# Patient Record
Sex: Female | Born: 1985 | Race: White | Hispanic: No | Marital: Married | Smoking: Never smoker
Health system: Southern US, Community
[De-identification: ages and names within clinical notes are randomized; demographics above are authoritative.]

## PROBLEM LIST (undated history)

## (undated) DIAGNOSIS — N809 Endometriosis, unspecified: Secondary | ICD-10-CM

## (undated) DIAGNOSIS — T7840XA Allergy, unspecified, initial encounter: Secondary | ICD-10-CM

## (undated) DIAGNOSIS — J45909 Unspecified asthma, uncomplicated: Secondary | ICD-10-CM

## (undated) DIAGNOSIS — E538 Deficiency of other specified B group vitamins: Secondary | ICD-10-CM

## (undated) DIAGNOSIS — M7989 Other specified soft tissue disorders: Secondary | ICD-10-CM

## (undated) DIAGNOSIS — R519 Headache, unspecified: Secondary | ICD-10-CM

## (undated) DIAGNOSIS — G4730 Sleep apnea, unspecified: Secondary | ICD-10-CM

## (undated) DIAGNOSIS — E282 Polycystic ovarian syndrome: Secondary | ICD-10-CM

## (undated) DIAGNOSIS — M1990 Unspecified osteoarthritis, unspecified site: Secondary | ICD-10-CM

## (undated) DIAGNOSIS — F32A Depression, unspecified: Secondary | ICD-10-CM

## (undated) DIAGNOSIS — K5900 Constipation, unspecified: Secondary | ICD-10-CM

## (undated) DIAGNOSIS — N979 Female infertility, unspecified: Secondary | ICD-10-CM

## (undated) DIAGNOSIS — F419 Anxiety disorder, unspecified: Secondary | ICD-10-CM

## (undated) DIAGNOSIS — K219 Gastro-esophageal reflux disease without esophagitis: Secondary | ICD-10-CM

## (undated) HISTORY — DX: Allergy, unspecified, initial encounter: T78.40XA

## (undated) HISTORY — DX: Female infertility, unspecified: N97.9

## (undated) HISTORY — DX: Polycystic ovarian syndrome: E28.2

## (undated) HISTORY — DX: Sleep apnea, unspecified: G47.30

## (undated) HISTORY — PX: LAPAROSCOPIC OVARIAN CYSTECTOMY: SHX6248

## (undated) HISTORY — PX: EXPLORATORY LAPAROTOMY: SUR591

## (undated) HISTORY — DX: Unspecified osteoarthritis, unspecified site: M19.90

## (undated) HISTORY — DX: Other specified soft tissue disorders: M79.89

## (undated) HISTORY — DX: Deficiency of other specified B group vitamins: E53.8

## (undated) HISTORY — DX: Constipation, unspecified: K59.00

---

## 2014-11-10 DIAGNOSIS — H9209 Otalgia, unspecified ear: Secondary | ICD-10-CM | POA: Insufficient documentation

## 2014-11-10 DIAGNOSIS — M26609 Unspecified temporomandibular joint disorder, unspecified side: Secondary | ICD-10-CM | POA: Insufficient documentation

## 2014-11-10 DIAGNOSIS — M542 Cervicalgia: Secondary | ICD-10-CM | POA: Insufficient documentation

## 2015-06-24 DIAGNOSIS — J209 Acute bronchitis, unspecified: Secondary | ICD-10-CM | POA: Insufficient documentation

## 2015-06-24 DIAGNOSIS — J302 Other seasonal allergic rhinitis: Secondary | ICD-10-CM | POA: Insufficient documentation

## 2015-06-24 DIAGNOSIS — T781XXA Other adverse food reactions, not elsewhere classified, initial encounter: Secondary | ICD-10-CM | POA: Insufficient documentation

## 2015-06-24 DIAGNOSIS — L509 Urticaria, unspecified: Secondary | ICD-10-CM | POA: Insufficient documentation

## 2015-06-24 DIAGNOSIS — J4540 Moderate persistent asthma, uncomplicated: Secondary | ICD-10-CM | POA: Insufficient documentation

## 2015-10-25 DIAGNOSIS — H6993 Unspecified Eustachian tube disorder, bilateral: Secondary | ICD-10-CM | POA: Insufficient documentation

## 2016-06-11 DIAGNOSIS — Z91013 Allergy to seafood: Secondary | ICD-10-CM | POA: Insufficient documentation

## 2018-08-19 DIAGNOSIS — R42 Dizziness and giddiness: Secondary | ICD-10-CM | POA: Insufficient documentation

## 2018-08-19 DIAGNOSIS — R202 Paresthesia of skin: Secondary | ICD-10-CM | POA: Insufficient documentation

## 2018-11-03 DIAGNOSIS — R000 Tachycardia, unspecified: Secondary | ICD-10-CM | POA: Insufficient documentation

## 2020-04-12 DIAGNOSIS — I10 Essential (primary) hypertension: Secondary | ICD-10-CM | POA: Insufficient documentation

## 2020-09-11 DIAGNOSIS — R918 Other nonspecific abnormal finding of lung field: Secondary | ICD-10-CM | POA: Diagnosis not present

## 2020-09-11 DIAGNOSIS — J1282 Pneumonia due to coronavirus disease 2019: Secondary | ICD-10-CM | POA: Diagnosis not present

## 2020-09-11 DIAGNOSIS — R112 Nausea with vomiting, unspecified: Secondary | ICD-10-CM | POA: Diagnosis not present

## 2020-09-11 DIAGNOSIS — R0602 Shortness of breath: Secondary | ICD-10-CM | POA: Diagnosis not present

## 2020-09-11 DIAGNOSIS — J45909 Unspecified asthma, uncomplicated: Secondary | ICD-10-CM | POA: Diagnosis not present

## 2020-09-11 DIAGNOSIS — U071 COVID-19: Secondary | ICD-10-CM | POA: Diagnosis not present

## 2021-02-09 DIAGNOSIS — D352 Benign neoplasm of pituitary gland: Secondary | ICD-10-CM | POA: Diagnosis not present

## 2021-03-15 DIAGNOSIS — Z7951 Long term (current) use of inhaled steroids: Secondary | ICD-10-CM | POA: Diagnosis not present

## 2021-03-15 DIAGNOSIS — Z882 Allergy status to sulfonamides status: Secondary | ICD-10-CM | POA: Diagnosis not present

## 2021-03-15 DIAGNOSIS — M79642 Pain in left hand: Secondary | ICD-10-CM | POA: Diagnosis not present

## 2021-03-15 DIAGNOSIS — Z87891 Personal history of nicotine dependence: Secondary | ICD-10-CM | POA: Diagnosis not present

## 2021-03-15 DIAGNOSIS — M25542 Pain in joints of left hand: Secondary | ICD-10-CM | POA: Diagnosis not present

## 2021-03-15 DIAGNOSIS — S61432A Puncture wound without foreign body of left hand, initial encounter: Secondary | ICD-10-CM | POA: Diagnosis not present

## 2021-03-15 DIAGNOSIS — Y9389 Activity, other specified: Secondary | ICD-10-CM | POA: Diagnosis not present

## 2021-03-15 DIAGNOSIS — W458XXA Other foreign body or object entering through skin, initial encounter: Secondary | ICD-10-CM | POA: Diagnosis not present

## 2021-03-15 DIAGNOSIS — Z23 Encounter for immunization: Secondary | ICD-10-CM | POA: Diagnosis not present

## 2021-03-15 DIAGNOSIS — Z91013 Allergy to seafood: Secondary | ICD-10-CM | POA: Diagnosis not present

## 2021-03-15 DIAGNOSIS — Y990 Civilian activity done for income or pay: Secondary | ICD-10-CM | POA: Diagnosis not present

## 2021-03-15 DIAGNOSIS — S6992XA Unspecified injury of left wrist, hand and finger(s), initial encounter: Secondary | ICD-10-CM | POA: Diagnosis not present

## 2021-03-15 DIAGNOSIS — Z79899 Other long term (current) drug therapy: Secondary | ICD-10-CM | POA: Diagnosis not present

## 2021-03-15 DIAGNOSIS — J45909 Unspecified asthma, uncomplicated: Secondary | ICD-10-CM | POA: Diagnosis not present

## 2021-03-15 DIAGNOSIS — Z91040 Latex allergy status: Secondary | ICD-10-CM | POA: Diagnosis not present

## 2021-03-16 DIAGNOSIS — Z13228 Encounter for screening for other metabolic disorders: Secondary | ICD-10-CM | POA: Diagnosis not present

## 2021-03-16 DIAGNOSIS — Z130 Encounter for screening for diseases of the blood and blood-forming organs and certain disorders involving the immune mechanism: Secondary | ICD-10-CM | POA: Diagnosis not present

## 2021-03-16 DIAGNOSIS — Z1329 Encounter for screening for other suspected endocrine disorder: Secondary | ICD-10-CM | POA: Diagnosis not present

## 2021-03-16 DIAGNOSIS — M25542 Pain in joints of left hand: Secondary | ICD-10-CM | POA: Diagnosis not present

## 2021-03-16 DIAGNOSIS — Z136 Encounter for screening for cardiovascular disorders: Secondary | ICD-10-CM | POA: Diagnosis not present

## 2021-03-16 DIAGNOSIS — S6992XA Unspecified injury of left wrist, hand and finger(s), initial encounter: Secondary | ICD-10-CM | POA: Diagnosis not present

## 2021-03-16 DIAGNOSIS — N809 Endometriosis, unspecified: Secondary | ICD-10-CM | POA: Diagnosis not present

## 2021-03-16 DIAGNOSIS — F3281 Premenstrual dysphoric disorder: Secondary | ICD-10-CM | POA: Diagnosis not present

## 2021-03-16 DIAGNOSIS — Z0000 Encounter for general adult medical examination without abnormal findings: Secondary | ICD-10-CM | POA: Diagnosis not present

## 2021-03-17 DIAGNOSIS — F3281 Premenstrual dysphoric disorder: Secondary | ICD-10-CM | POA: Insufficient documentation

## 2021-03-17 DIAGNOSIS — N809 Endometriosis, unspecified: Secondary | ICD-10-CM | POA: Insufficient documentation

## 2021-04-13 DIAGNOSIS — R42 Dizziness and giddiness: Secondary | ICD-10-CM | POA: Diagnosis not present

## 2021-04-13 DIAGNOSIS — G43109 Migraine with aura, not intractable, without status migrainosus: Secondary | ICD-10-CM | POA: Insufficient documentation

## 2021-04-13 DIAGNOSIS — R202 Paresthesia of skin: Secondary | ICD-10-CM | POA: Diagnosis not present

## 2021-04-13 DIAGNOSIS — F3281 Premenstrual dysphoric disorder: Secondary | ICD-10-CM | POA: Diagnosis not present

## 2021-04-13 DIAGNOSIS — I10 Essential (primary) hypertension: Secondary | ICD-10-CM | POA: Diagnosis not present

## 2021-05-25 DIAGNOSIS — F419 Anxiety disorder, unspecified: Secondary | ICD-10-CM | POA: Diagnosis not present

## 2021-05-25 DIAGNOSIS — G4700 Insomnia, unspecified: Secondary | ICD-10-CM | POA: Diagnosis not present

## 2021-05-25 DIAGNOSIS — F3281 Premenstrual dysphoric disorder: Secondary | ICD-10-CM | POA: Diagnosis not present

## 2021-07-20 DIAGNOSIS — J069 Acute upper respiratory infection, unspecified: Secondary | ICD-10-CM | POA: Diagnosis not present

## 2021-07-20 DIAGNOSIS — R5383 Other fatigue: Secondary | ICD-10-CM | POA: Diagnosis not present

## 2021-07-20 DIAGNOSIS — J029 Acute pharyngitis, unspecified: Secondary | ICD-10-CM | POA: Diagnosis not present

## 2021-09-17 ENCOUNTER — Encounter (HOSPITAL_BASED_OUTPATIENT_CLINIC_OR_DEPARTMENT_OTHER): Payer: Self-pay

## 2021-09-17 ENCOUNTER — Emergency Department (HOSPITAL_BASED_OUTPATIENT_CLINIC_OR_DEPARTMENT_OTHER): Payer: BC Managed Care – PPO

## 2021-09-17 ENCOUNTER — Other Ambulatory Visit: Payer: Self-pay

## 2021-09-17 ENCOUNTER — Emergency Department (HOSPITAL_BASED_OUTPATIENT_CLINIC_OR_DEPARTMENT_OTHER)
Admission: EM | Admit: 2021-09-17 | Discharge: 2021-09-18 | Disposition: A | Discharge status: Home / Self Care | Payer: BC Managed Care – PPO | Attending: Emergency Medicine | Admitting: Emergency Medicine

## 2021-09-17 DIAGNOSIS — R188 Other ascites: Secondary | ICD-10-CM | POA: Diagnosis not present

## 2021-09-17 DIAGNOSIS — R102 Pelvic and perineal pain: Secondary | ICD-10-CM

## 2021-09-17 DIAGNOSIS — R1032 Left lower quadrant pain: Secondary | ICD-10-CM | POA: Diagnosis not present

## 2021-09-17 DIAGNOSIS — N809 Endometriosis, unspecified: Secondary | ICD-10-CM | POA: Diagnosis not present

## 2021-09-17 DIAGNOSIS — R109 Unspecified abdominal pain: Secondary | ICD-10-CM | POA: Diagnosis not present

## 2021-09-17 DIAGNOSIS — N200 Calculus of kidney: Secondary | ICD-10-CM | POA: Diagnosis not present

## 2021-09-17 HISTORY — DX: Endometriosis, unspecified: N80.9

## 2021-09-17 LAB — CBC WITH DIFFERENTIAL/PLATELET
Abs Immature Granulocytes: 0.01 10*3/uL (ref 0.00–0.07)
Basophils Absolute: 0.1 10*3/uL (ref 0.0–0.1)
Basophils Relative: 1 %
Eosinophils Absolute: 0.1 10*3/uL (ref 0.0–0.5)
Eosinophils Relative: 2 %
HCT: 36.3 % (ref 36.0–46.0)
Hemoglobin: 11.7 g/dL — ABNORMAL LOW (ref 12.0–15.0)
Immature Granulocytes: 0 %
Lymphocytes Relative: 34 %
Lymphs Abs: 2.3 10*3/uL (ref 0.7–4.0)
MCH: 30.2 pg (ref 26.0–34.0)
MCHC: 32.2 g/dL (ref 30.0–36.0)
MCV: 93.6 fL (ref 80.0–100.0)
Monocytes Absolute: 0.5 10*3/uL (ref 0.1–1.0)
Monocytes Relative: 7 %
Neutro Abs: 3.8 10*3/uL (ref 1.7–7.7)
Neutrophils Relative %: 56 %
Platelets: 287 10*3/uL (ref 150–400)
RBC: 3.88 MIL/uL (ref 3.87–5.11)
RDW: 13.4 % (ref 11.5–15.5)
WBC: 6.8 10*3/uL (ref 4.0–10.5)
nRBC: 0 % (ref 0.0–0.2)

## 2021-09-17 LAB — COMPREHENSIVE METABOLIC PANEL
ALT: 11 U/L (ref 0–44)
AST: 12 U/L — ABNORMAL LOW (ref 15–41)
Albumin: 4.4 g/dL (ref 3.5–5.0)
Alkaline Phosphatase: 58 U/L (ref 38–126)
Anion gap: 10 (ref 5–15)
BUN: 10 mg/dL (ref 6–20)
CO2: 24 mmol/L (ref 22–32)
Calcium: 9.2 mg/dL (ref 8.9–10.3)
Chloride: 104 mmol/L (ref 98–111)
Creatinine, Ser: 0.86 mg/dL (ref 0.44–1.00)
GFR, Estimated: >60 mL/min (ref >60)
Glucose, Bld: 90 mg/dL (ref 70–99)
Potassium: 3.6 mmol/L (ref 3.5–5.1)
Sodium: 138 mmol/L (ref 135–145)
Total Bilirubin: 0.3 mg/dL (ref 0.3–1.2)
Total Protein: 7.1 g/dL (ref 6.5–8.1)

## 2021-09-17 LAB — LIPASE, BLOOD: Lipase: 18 U/L (ref 11–51)

## 2021-09-17 LAB — URINALYSIS, ROUTINE W REFLEX MICROSCOPIC
Bilirubin Urine: NEGATIVE
Glucose, UA: NEGATIVE mg/dL
Hgb urine dipstick: NEGATIVE
Ketones, ur: 15 mg/dL — AB
Leukocytes,Ua: NEGATIVE
Nitrite: NEGATIVE
Specific Gravity, Urine: 1.034 — ABNORMAL HIGH (ref 1.005–1.030)
pH: 6 (ref 5.0–8.0)

## 2021-09-17 LAB — PREGNANCY, URINE: Preg Test, Ur: NEGATIVE

## 2021-09-17 MED ORDER — MORPHINE SULFATE (PF) 4 MG/ML IV SOLN
4.0000 mg | Freq: Once | INTRAVENOUS | Status: AC
  Administered 2021-09-17: 4 mg via INTRAVENOUS
  Filled 2021-09-17: qty 1

## 2021-09-17 MED ORDER — IOHEXOL 300 MG/ML  SOLN
100.0000 mL | Freq: Once | INTRAMUSCULAR | Status: AC | PRN
  Administered 2021-09-17: 100 mL via INTRAVENOUS

## 2021-09-17 MED ORDER — LACTATED RINGERS IV BOLUS
1000.0000 mL | Freq: Once | INTRAVENOUS | Status: AC
  Administered 2021-09-17: 1000 mL via INTRAVENOUS

## 2021-09-17 MED ORDER — OXYCODONE-ACETAMINOPHEN 5-325 MG PO TABS: 1.0000 | ORAL_TABLET | Freq: Four times a day (QID) | ORAL | 0 refills | Status: DC | PRN

## 2021-09-17 NOTE — Discharge Instructions (Signed)
Call your primary care doctor or specialist as discussed in the next 2-3 days.   Return immediately back to the ER if:  Your symptoms worsen within the next 12-24 hours. You develop new symptoms such as new fevers, persistent vomiting, new pain, shortness of breath, or new weakness or numbness, or if you have any other concerns.  

## 2021-09-17 NOTE — ED Provider Notes (Signed)
Work-up in the ER showed trace to moderate pelvic free fluid thought to be physiologic on CT imaging.  No adnexal masses noted.  On my evaluation patient appears comfortable.  She states her pain is improved and nearly gone.  She has an appointment with her endometriosis specialist coming up and would like some pain medicines while she is waiting for the appointment.  Advised immediate return back to the ER if she has worsening symptoms or fevers otherwise to call her physician's office in the next 1 to 2 days.  Patient states understanding, discharged home in stable condition.   Luna Fuse, MD 09/17/21 2337

## 2021-09-17 NOTE — ED Triage Notes (Signed)
Patient here POV from Home with Pelvic Pain.  Patient states she has Endometriosis and this Occurrence has been occurring for approximately 1 month. Patient has Appointment with Specialist in 11 days but could not withstand symptoms and would like Medication for inflammation.  NAD Noted during Triage. A&Ox4. GCS 15.

## 2021-09-17 NOTE — ED Provider Notes (Addendum)
[facility] Provider Note   CSN: [id number] Arrival date & time: 09/17/21  1816     History Chief Complaint  Patient presents with   Pelvic Pain    Tara Singleton is a 36 y.o. female presents the emergency department for evaluation of pelvic pain and lower abdominal pain.  Patient reports that it feels like she has "hot" in her lower abdomen and has occasional "hot poker stabbing" in her right lower quadrant and left lower quadrant.  Patient reports this pain been having for the last month but has been gradually worsening this past week.  She reports she has had some abdominal distention as well as some constipation.  She reports she additionally has some nausea, without vomiting although this is chronic for her.  Denies any diarrhea, fevers, vaginal discharge, vaginal bleeding, or hematuria.  She does report some dysuria.  Medical history includes endometriosis, pituitary adenoma, PCOS, and asthma.  Surgical history includes laparoscopy, and ruptured ovarian cyst as well as a tonsillectomy.  Daily medications include Zyrtec, vitamin D, fluoxetine, verapamil, trazodone, topiramate, rizatriptan.  Allergic to Bactrim and Macrobid.  Denies any tobacco, EtOH, illicit drug use ever.   Pelvic Pain Associated symptoms include abdominal pain. Pertinent negatives include no chest pain and no shortness of breath.      Home Medications Prior to Admission medications   Not on File      Allergies    Macrobid [nitrofurantoin] and Sulfa antibiotics    Review of Systems   Review of Systems  Constitutional:  Negative for chills and fever.  HENT:  Negative for ear pain and sore throat.   Eyes:  Negative for pain and visual disturbance.  Respiratory:  Negative for cough and shortness of breath.   Cardiovascular:  Negative for chest pain and palpitations.  Gastrointestinal:  Positive for abdominal distention, abdominal pain, constipation and nausea. Negative for  diarrhea and vomiting.  Genitourinary:  Positive for dysuria and pelvic pain. Negative for hematuria.  Musculoskeletal:  Negative for arthralgias and back pain.  Skin:  Negative for color change and rash.  Neurological:  Negative for seizures and syncope.  All other systems reviewed and are negative.  Physical Exam Updated Vital Signs BP 91/62 (BP Location: Right Arm)    Pulse 65    Temp 98 F (36.7 C)    Resp 16    Ht 5\' 6"  (1.676 m)    Wt 124.7 kg    SpO2 100%    BMI 44.39 kg/m  Physical Exam Vitals and nursing note reviewed.  Constitutional:      General: She is not in acute distress.    Appearance: Normal appearance. She is not toxic-appearing.  HENT:     Head: Normocephalic and atraumatic.     Mouth/Throat:     Mouth: Mucous membranes are moist.  Eyes:     General: No scleral icterus. Cardiovascular:     Rate and Rhythm: Normal rate and regular rhythm.  Pulmonary:     Effort: Pulmonary effort is normal. No respiratory distress.     Breath sounds: Normal breath sounds.  Abdominal:     General: Bowel sounds are normal.     Palpations: Abdomen is soft.     Tenderness: There is abdominal tenderness. There is no right CVA tenderness, left CVA tenderness, guarding or rebound.     Comments: Abdominal exam limited secondary to body habitus.  Tenderness to the left lower quadrant greater than right lower quadrant.  Negative Rovsing's.  No  guarding or rebound.  Abdomen soft.  Normoactive bowel sounds.  Musculoskeletal:        General: No deformity.     Cervical back: Normal range of motion.  Skin:    General: Skin is warm and dry.  Neurological:     General: No focal deficit present.     Mental Status: She is alert. Mental status is at baseline.    ED Results / Procedures / Treatments   Labs (all labs ordered are listed, but only abnormal results are displayed) Labs Reviewed  URINALYSIS, ROUTINE W REFLEX MICROSCOPIC - Abnormal; Notable for the following components:       Result Value   Specific Gravity, Urine 1.034 (*)    Ketones, ur 15 (*)    Protein, ur TRACE (*)    All other components within normal limits  CBC WITH DIFFERENTIAL/PLATELET - Abnormal; Notable for the following components:   Hemoglobin 11.7 (*)    All other components within normal limits  COMPREHENSIVE METABOLIC PANEL - Abnormal; Notable for the following components:   AST 12 (*)    All other components within normal limits  PREGNANCY, URINE  LIPASE, BLOOD    EKG None  Radiology US PELVIC COMPLETE W TRANSVAGINAL AND TORSION R/O  Result Date: 09/17/2021 CLINICAL DATA:  Pelvic pain EXAM: TRANSABDOMINAL AND TRANSVAGINAL ULTRASOUND OF PELVIS DOPPLER ULTRASOUND OF OVARIES TECHNIQUE: Both transabdominal and transvaginal ultrasound examinations of the pelvis were performed. Transabdominal technique was performed for global imaging of the pelvis including uterus, ovaries, adnexal regions, and pelvic cul-de-sac. It was necessary to proceed with endovaginal exam following the transabdominal exam to visualize the uterus, endometrium, ovaries and adnexa. Color and duplex Doppler ultrasound was utilized to evaluate blood flow to the ovaries. COMPARISON:  None. FINDINGS: Uterus Measurements: 6.4 x 3.1 x 3.8 cm = volume: 39 mL. No fibroids or other mass visualized. Endometrium Thickness: 6 mm in thickness.  No focal abnormality visualized. Right ovary Measurements: 2.6 x 2.8 x 2.6 cm = volume: 9.8 mL. Normal appearance/no adnexal mass. Left ovary Measurements: 2.1 x 3.6 x 2.4 cm = volume: 9.3 mL. Normal appearance/no adnexal mass. Pulsed Doppler evaluation of both ovaries demonstrates normal low-resistance arterial and venous waveforms. Other findings Moderate free fluid in the pelvis. IMPRESSION: Moderate free fluid in the pelvis.  Otherwise negative. Electronically Signed   By: Rolm Baptise M.D.   On: 09/17/2021 21:36    Procedures Procedures   Medications Ordered in ED Medications  lactated ringers  bolus 1,000 mL (1,000 mLs Intravenous New Bag/Given 09/17/21 2154)    ED Course/ Medical Decision Making/ A&P                           Medical Decision Making  36 year old female presents emergency department for gradually worsening abdominal pain left greater than right for the past month, gradually worse the past week.  Differential diagnosis includes but is not limited to ovarian torsion, endometriosis, ovarian cyst, ruptured cyst, ectopic pregnancy, diverticulitis, constipation, SBO.  Vital signs show normotension with normal pulse rate.  Afebrile and satting 100% on room air.  Physical exam shows left lower quadrant pain greater than right lower quadrant pain.  No guarding or rebound.  Negative Rovsing's.  Soft abdomen, nonsurgical.  Otherwise normal.  Labs and ultrasound ordered.  Ultrasound shows moderate amount of free fluid in the pelvis.  Will order CT for correlation per my attending.  Labs show slightly decreased hemoglobin 11.7, however no  elevated white blood cell count.  Urine shows concentrated urine with trace ketones and protein.  Likely due to dehydration as patient reports she does not drink much water.  CMP shows slightly decreased AST, however no Electra abnormalities.  Normal creatinine.  Lipase negative.  1 L of lactated Ringer's ordered as well is 4 mg of morphine.  Call handoff to Dr. Almyra Free at this time pending CT scan.  10:35 PM Care of Leatta Alewine  transferred to Dr. Almyra Free at the end of my shift as the patient will require reassessment once labs/imaging have resulted. Patient presentation, ED course, and plan of care discussed with review of all pertinent labs and imaging. Please see his/her note for further details regarding further ED course and disposition. Plan at time of handoff is pending CT scan, likely discharge home. This may be altered or completely changed at the discretion of the oncoming team pending results of further workup.  Final Clinical  Impression(s) / ED Diagnoses Final diagnoses:  Pelvic pain  Endometriosis    Rx / DC Orders ED Discharge Orders     None         Sherrell Puller, PA-C 09/17/21 2239    Sherrell Puller, PA-C 09/17/21 2303    Luna Fuse, MD 09/17/21 2336

## 2021-09-18 NOTE — ED Notes (Signed)
Pt teaching provided on medications that may cause drowsiness. Pt instructed not to drive or operate heavy machinery while taking the prescribed medication. Pt verbalized understanding.  ? ?Pt provided discharge instructions and prescription information. Pt was given the opportunity to ask questions and questions were answered. Discharge signature not obtained in the setting of the COVID-19 pandemic in order to reduce high touch surfaces.  ? ?

## 2021-09-28 DIAGNOSIS — E282 Polycystic ovarian syndrome: Secondary | ICD-10-CM | POA: Diagnosis not present

## 2021-09-28 DIAGNOSIS — M6289 Other specified disorders of muscle: Secondary | ICD-10-CM | POA: Diagnosis not present

## 2021-09-28 DIAGNOSIS — N803 Endometriosis of pelvic peritoneum, unspecified: Secondary | ICD-10-CM | POA: Diagnosis not present

## 2021-09-28 DIAGNOSIS — Z124 Encounter for screening for malignant neoplasm of cervix: Secondary | ICD-10-CM | POA: Diagnosis not present

## 2021-10-05 ENCOUNTER — Ambulatory Visit: Payer: BC Managed Care – PPO | Attending: Obstetrics and Gynecology | Admitting: Physical Therapy

## 2021-10-05 ENCOUNTER — Other Ambulatory Visit: Payer: Self-pay

## 2021-10-05 DIAGNOSIS — M6281 Muscle weakness (generalized): Secondary | ICD-10-CM | POA: Insufficient documentation

## 2021-10-05 DIAGNOSIS — M62838 Other muscle spasm: Secondary | ICD-10-CM | POA: Diagnosis not present

## 2021-10-07 NOTE — Therapy (Signed)
[facility] @ [facility], [place], [address] Phone: [phone]   Fax:  [phone]  Physical Therapy Evaluation  Patient Details  Name: Tara Singleton MRN: [id number] Date of Birth: 01-01-1986 Referring Provider (PT): Lysbeth Penner   Encounter Date: 10/05/2021   PT End of Session - 10/07/21 0943     Visit Number 1    Date for PT Re-Evaluation 12/28/21    Authorization Type BCBS    PT Start Time 0933    PT Stop Time 1013    PT Time Calculation (min) 40 min    Activity Tolerance Patient tolerated treatment well    Behavior During Therapy [facility] for tasks assessed/performed             Past Medical History:  Diagnosis Date   Endometriosis     No past surgical history on file.  There were no vitals filed for this visit.    Subjective Assessment - 10/07/21 1004     Subjective This last year I helped my mom move b/c step dad passed and then she moved and got married.  Pt has a lot of medical history as seen below.  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    Pertinent History PCOS, endometriosis, laproscopic surgery when dx endo in 2018, pituitary adenoma, asthma    Patient Stated Goals decreased pain    Currently in Pain? Yes    Pain Score 2    this is a good day and had muscle relaxer   Pain Location Pelvis    Pain Orientation Left    Pain Descriptors / Indicators Burning;Shooting;Sharp    Pain Type Chronic pain    Pain Radiating Towards left lower abdomen    Pain Onset More than a month ago    Pain Frequency Constant    Aggravating Factors  hard work week, stress    Pain Relieving Factors heated blanket, muscle relaxer    Multiple Pain Sites No                [facility] PT Assessment - 10/07/21 0001       Assessment   Medical Diagnosis endometriosis    Referring Provider (PT) Clide Dales, Amy Nicole    Onset Date/Surgical Date --   been getting worse this last year   Prior  Therapy No      Precautions   Precautions None      Balance Screen   Has the patient fallen in the past 6 months No      [place] residence    Living Arrangements Spouse/significant other   2 cats and dog     Prior Function   Level of Independence Independent    Vocation Full time employment    Vocation Requirements sitting, standing, 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      Cognition   Overall Cognitive Status Within Functional Limits for tasks assessed      Posture/Postural Control   Posture/Postural Control Postural limitations    Postural Limitations Increased lumbar lordosis;Rounded Shoulders      ROM / Strength   AROM / PROM / Strength AROM;PROM;Strength      AROM   Overall AROM Comments lumbar flexion 75%      PROM   Overall PROM Comments bil IR 50%; Lt hip flexion 75% +pain      Strength   Overall Strength Comments 5/5 bil  hip      Flexibility   Soft Tissue Assessment /Muscle Length yes    Hamstrings 70% +pain      Palpation   Palpation comment glutes, lumbar and thoracic paraspinals tight, TTP lower left quadrant      Ambulation/Gait   Gait Pattern Within Functional Limits                        Objective measurements completed on examination: See above findings.     Pelvic Floor Special Questions - 10/07/21 0001     Are you Pregnant or attempting pregnancy? No    Prior Pregnancies Yes    Number of Pregnancies 5   all miscarriages   Currently Sexually Active No   hasn't been for 2 years   Urinary Leakage No    Urinary urgency No    Urinary frequency sometimes more frequently and feel like not emptying my bladder all the way    Fecal incontinence --   constipation   Fluid intake I drink a lot of water    Pelvic Floor Internal Exam pt identity confirmed and informed consent    Exam Type Vaginal    Palpation TTP throught Lt>Rt and towards Lt obdurator very TTP and more  strength Rt levators    Strength fair squeeze, definite lift    Strength # of reps 1    Tone high                       PT Education - 10/07/21 1005     Education Details gave instructions on diaphragmatic breathing and do pelvic floor meditation    Person(s) Educated Patient    Methods Explanation;Handout    Comprehension Verbalized understanding              PT Short Term Goals - 10/07/21 1003       PT SHORT TERM GOAL #1   Title Pt will report pain decreased by at least 25%    Time 6    Period Weeks    Status New    Target Date 11/16/21               PT Long Term Goals - 10/07/21 0950       PT LONG TERM GOAL #1   Title Pt will report at least 50% reduced pain    Time 12    Period Weeks    Status New    Target Date 12/28/21      PT LONG TERM GOAL #2   Title Pt will be ind with HEP for pain management    Time 12    Period Weeks    Status New    Target Date 12/28/21      PT LONG TERM GOAL #3   Title Pt will be able to manage working without pain flare ups even during a more challengeing work week due to improved pain management strategies    Time 12    Period Weeks    Status New    Target Date 12/28/21      PT LONG TERM GOAL #4   Title Pt will have normal hip ROM flexion and rotation without pain for improved functional positions and movements    Time 12    Period Weeks    Status New    Target Date 12/28/21  Plan - 10/07/21 1534     Clinical Impression Statement Pt presents to clinic due to pelvic pain and endometriosis causing worsening pain over the past two years.  Pt has a lot of tension in the pelvic floor and tender to palpation Lt>Rt levators and obdurator internus.  Pt has tight hamstrings and limted hip ROM with pain as noted above.  Pt has 3/5 MMT of the pelvic floor and taking extra time to relax after contracting.  Pt will benefit from skilled PT to improve pain management and functional  activities with reduced pain.    Personal Factors and Comorbidities Comorbidity 2;Time since onset of injury/illness/exacerbation    Comorbidities PCOS, endo    Examination-Participation Restrictions Community Activity;Occupation;Interpersonal Relationship    Stability/Clinical Decision Making Evolving/Moderate complexity    Clinical Decision Making Moderate    Rehab Potential Excellent    PT Frequency 1x / week    PT Duration 12 weeks    PT Treatment/Interventions ADLs/Self Care Home Management;Biofeedback;Cryotherapy;Electrical Stimulation;Moist Heat;Neuromuscular re-education;Therapeutic exercise;Therapeutic activities;Taping;Patient/family education;Manual techniques;Dry needling;Passive range of motion    PT Next Visit Plan STM low back and thoracic paraspinals, internal fascial release in 1-2 visits and f/u on breathing and bulging    PT Home Exercise Plan meditation; add stretches next    Consulted and Agree with Plan of Care Patient             Patient will benefit from skilled therapeutic intervention in order to improve the following deficits and impairments:  Pain, Decreased coordination, Decreased range of motion, Postural dysfunction, Decreased strength, Increased muscle spasms  Visit Diagnosis: Other muscle spasm  Muscle weakness (generalized)     Problem List There are no problems to display for this patient.   Jule Ser, PT 10/07/2021, 5:01 PM  [facility] @ [facility], [place], [address] Phone: [phone]   Fax:  [phone]  Name: Tara Singleton MRN: [id number] Date of Birth: 1986/08/04

## 2021-10-09 ENCOUNTER — Encounter: Payer: BC Managed Care – PPO | Admitting: Physical Therapy

## 2021-10-26 ENCOUNTER — Ambulatory Visit: Payer: BC Managed Care – PPO | Admitting: Physical Therapy

## 2021-11-09 ENCOUNTER — Ambulatory Visit: Payer: BC Managed Care – PPO | Attending: Obstetrics and Gynecology | Admitting: Physical Therapy

## 2021-11-09 ENCOUNTER — Encounter: Payer: Self-pay | Admitting: Physical Therapy

## 2021-11-09 ENCOUNTER — Other Ambulatory Visit: Payer: Self-pay

## 2021-11-09 DIAGNOSIS — M6281 Muscle weakness (generalized): Secondary | ICD-10-CM | POA: Insufficient documentation

## 2021-11-09 DIAGNOSIS — M62838 Other muscle spasm: Secondary | ICD-10-CM | POA: Diagnosis not present

## 2021-11-09 NOTE — Therapy (Signed)
[facility] ?[facility] @ [place] ?[place], [place], [address] ?Phone: [phone]   Fax:  [phone] ? ?Physical Therapy Treatment ? ?Patient Details  ?Name: Tara Singleton ?MRN: [id number] ?Date of Birth: October 29, 1985 ?Referring Provider (PT): Lysbeth Penner ? ? ?Encounter Date: 11/09/2021 ? ? PT End of Session - 11/09/21 1012   ? ? Visit Number 2   ? Date for PT Re-Evaluation 12/28/21   ? Authorization Type BCBS   ? PT Start Time 1006   ? PT Stop Time 1048   ? PT Time Calculation (min) 42 min   ? Activity Tolerance Patient tolerated treatment well   ? Behavior During Therapy [facility] for tasks assessed/performed   ? ?  ?  ? ?  ? ? ?Past Medical History:  ?Diagnosis Date  ? Endometriosis   ? ? ?History reviewed. No pertinent surgical history. ? ?There were no vitals filed for this visit. ? ? Subjective Assessment - 11/09/21 1010   ? ? Subjective The stretches are going well and videos of pelvic floor stuff online.  I was able to get the cup in doing the stretches.   ? Pertinent History PCOS, endometriosis, laproscopic surgery when dx endo in 2018, pituitary adenoma, asthma   ? Patient Stated Goals decreased pain   ? Currently in Pain? No/denies   ? ?  ?  ? ?  ? ? ? ? ? ? ? ? ? ? ? ? ? ? ? ? ? ? ? ? [facility] PT Treatment/Exercise - 11/09/21 0001   ? ?  ? Neuro Re-ed   ? Neuro Re-ed Details  breathing with yoga block for stretching and activation of core   ?  ? Exercises  ? Exercises Other Exercises   ? Other Exercises  stretches: hip rotation and lean on table toes together   ?  ? Manual Therapy  ? Manual Therapy Soft tissue mobilization;Internal Pelvic Floor   ? Manual therapy comments pt identity confirmed and intenral soft tissue assess and treated   ? Soft tissue mobilization lumbar parapsinlas prone with pillow   ? Internal Pelvic Floor bilat bulbocavernosis and levators with breahting; Lt a little tighter   ? ?  ?  ? ?  ? ? ? ? ? ? ? ? ? ? PT Education - 11/09/21  1100   ? ? Education Details Access Code: [email or web address]   ? Person(s) Educated Patient   ? Methods Explanation;Demonstration;Tactile cues;Verbal cues;Handout   ? Comprehension Verbalized understanding;Returned demonstration   ? ?  ?  ? ?  ? ? ? PT Short Term Goals - 11/09/21 1107   ? ?  ? PT SHORT TERM GOAL #1  ? Title Pt will report pain decreased by at least 25%   ? Baseline definitely less , did not specify how much   ? Status Partially Met   ? ?  ?  ? ?  ? ? ? ? PT Long Term Goals - 11/09/21 1020   ? ?  ? PT LONG TERM GOAL #1  ? Title Pt will report at least 50% reduced pain   ? Status On-going   ?  ? PT LONG TERM GOAL #2  ? Title Pt will be ind with HEP for pain management   ? Status On-going   ?  ? PT LONG TERM GOAL #3  ? Title Pt will be able to manage working without pain flare ups even during a more challengeing work week  due to improved pain management strategies   ? Status On-going   ?  ? PT LONG TERM GOAL #4  ? Title Pt will have normal hip ROM flexion and rotation without pain for improved functional positions and movements   ? Status On-going   ? ?  ?  ? ?  ? ? ? ? ? ? ? ? Plan - 11/09/21 1101   ? ? Clinical Impression Statement Pt did well with today's treatmentand feeling stretches in appropriate places.  Her pain is down significantly with stretches she has been doing.  Pt tolerated stretches and STM internally today.  Pt needed cues to keep back flat due to tight hamstrings.  Pt also demonstrates increased ribcage flaring at rest and needed cues on correctly engaging the core.  Pt will benefit from skilled PT to continue to address core strength and flexibility so less stress being place on the pelvic floor, ultimately to manage pain more efficiently.   ? PT Treatment/Interventions ADLs/Self Care Home Management;Biofeedback;Cryotherapy;Electrical Stimulation;Moist Heat;Neuromuscular re-education;Therapeutic exercise;Therapeutic activities;Taping;Patient/family education;Manual techniques;Dry  needling;Passive range of motion   ? PT Next Visit Plan down dog on table to plank, core activation, internal STM and give info on pelvic wand if still having a lot of tension there, thoracic mobility stretches   ? PT Home Exercise Plan Access Code: [email or web address]   ? Consulted and Agree with Plan of Care Patient   ? ?  ?  ? ?  ? ? ?Patient will benefit from skilled therapeutic intervention in order to improve the following deficits and impairments:  Pain, Decreased coordination, Decreased range of motion, Postural dysfunction, Decreased strength, Increased muscle spasms ? ?Visit Diagnosis: ?Other muscle spasm ? ?Muscle weakness (generalized) ? ? ? ? ?Problem List ?There are no problems to display for this patient. ? ? ?Jule Ser, PT ?11/09/2021, 11:08 AM ? ?[facility] ?[facility] @ [place] ?[place], [place], [address] ?Phone: [phone]   Fax:  [phone] ? ?Name: Caelynn Marshman ?MRN: [id number] ?Date of Birth: 10/26/1985 ? ? ? ?

## 2021-11-09 NOTE — Patient Instructions (Signed)
Access Code: [email or web address] ?URL: [email or web address] ?Date: 11/09/2021 ?Prepared by: Jari Favre ? ?Exercises ?Supine Hip Internal and External Rotation - 1 x daily - 7 x weekly - 1 sets - 10 reps - 5 sec hold ?Supine Diaphragmatic Breathing - 3 x daily - 7 x weekly - 1 sets - 10 reps ?Standing Low Back Flexion at Table - 3 x daily - 7 x weekly - 1 sets - 8 reps ?Standing Hamstring Stretch with Step - 1 x daily - 7 x weekly - 1 sets - 3 reps - 30 sec hold ? ?

## 2021-11-16 ENCOUNTER — Ambulatory Visit: Payer: BC Managed Care – PPO | Admitting: Physical Therapy

## 2021-11-23 ENCOUNTER — Other Ambulatory Visit: Payer: Self-pay

## 2021-11-23 ENCOUNTER — Ambulatory Visit: Payer: BC Managed Care – PPO | Admitting: Physical Therapy

## 2021-11-23 ENCOUNTER — Encounter: Payer: Self-pay | Admitting: Physical Therapy

## 2021-11-23 DIAGNOSIS — M6281 Muscle weakness (generalized): Secondary | ICD-10-CM | POA: Diagnosis not present

## 2021-11-23 DIAGNOSIS — M62838 Other muscle spasm: Secondary | ICD-10-CM

## 2021-11-25 NOTE — Therapy (Signed)
[facility] ?[facility] ?[place], [place], [address] ?Phone: [phone]   Fax:  [phone] ? ?Physical Therapy Treatment ? ?Patient Details  ?Name: Tara Singleton ?MRN: [id number] ?Date of Birth: [birth year] ?Referring Provider (PT): Lysbeth Penner ? ? ?Encounter Date: 11/23/2021 ? ? PT End of Session - 11/25/21 1733   ? ? Visit Number 3   ? Date for PT Re-Evaluation 12/28/21   ? Authorization Type BCBS   ? PT Start Time 1018   ? PT Stop Time 1054   ? PT Time Calculation (min) 36 min   ? Activity Tolerance Patient tolerated treatment well   ? Behavior During Therapy [facility] for tasks assessed/performed   ? ?  ?  ? ?  ? ? ?Past Medical History:  ?Diagnosis Date  ? Endometriosis   ? ? ?History reviewed. No pertinent surgical history. ? ?There were no vitals filed for this visit. ? ? Subjective Assessment - 11/25/21 1734   ? ? Subjective I feel much better even though my week was stressful.  Today my back is sore and I think it is because of ovulating because I have that feeling.  Pt has wand with her.   ? Pertinent History PCOS, endometriosis, laproscopic surgery when dx endo in 2018, pituitary adenoma, asthma   ? Patient Stated Goals decreased pain   ? Currently in Pain? No/denies   ? ?  ?  ? ?  ? ? ? ? ? ? ? ? ? ? ? ? ? ? ? ? ? ? ? ? [facility] Adult PT Treatment/Exercise - 11/25/21 0001   ? ?  ? Self-Care  ? Self-Care Other Self-Care Comments   ? Other Self-Care Comments  pelvic floor wand demo and use to make sure she is able to do at home   ?  ? Exercises  ? Other Exercises  pelvic tilt at wall   ?  ? Manual Therapy  ? Manual Therapy Soft tissue mobilization;Internal Pelvic Floor   ? Manual therapy comments pt identity confirmed and intenral soft tissue assess and treated   ? Internal Pelvic Floor bilat bulbocavernosis and levators with breathing; Lt a little tighter   ? ?  ?  ? ?  ? ? ? ? ? ? ? ? ? ? ? ? PT Short Term Goals - 11/09/21 1107   ? ?  ? PT SHORT  TERM GOAL #1  ? Title Pt will report pain decreased by at least 25%   ? Baseline definitely less , did not specify how much   ? Status Partially Met   ? ?  ?  ? ?  ? ? ? ? PT Long Term Goals - 11/23/21 1048   ? ?  ? PT LONG TERM GOAL #1  ? Title Pt will report at least 50% reduced pain   ? Baseline 90% better   ? Status Achieved   ?  ? PT LONG TERM GOAL #2  ? Title Pt will be ind with HEP for pain management   ?  ? PT LONG TERM GOAL #3  ? Title Pt will be able to manage working without pain flare ups even during a more challengeing work week due to improved pain management strategies   ? ?  ?  ? ?  ? ? ? ? ? ? ? ? Plan - 11/25/21 1738   ? ? Clinical Impression Statement Today's session focused on pt learning how to use  the pelvic wand and being comfortable with using at home for trigger point release.  She successfully was able to use and released pelvic floor muscles feeling it relieved some pain in her back.   ? PT Treatment/Interventions ADLs/Self Care Home Management;Biofeedback;Cryotherapy;Electrical Stimulation;Moist Heat;Neuromuscular re-education;Therapeutic exercise;Therapeutic activities;Taping;Patient/family education;Manual techniques;Dry needling;Passive range of motion   ? PT Next Visit Plan work on some core strengthening exercises to reinforce pelvic stability   ? PT Home Exercise Plan Access Code: [email or web address]   ? Consulted and Agree with Plan of Care Patient   ? ?  ?  ? ?  ? ? ?Patient will benefit from skilled therapeutic intervention in order to improve the following deficits and impairments:  Pain, Decreased coordination, Decreased range of motion, Postural dysfunction, Decreased strength, Increased muscle spasms ? ?Visit Diagnosis: ?Other muscle spasm ? ?Muscle weakness (generalized) ? ? ? ? ?Problem List ?There are no problems to display for this patient. ? ? ?Jule Ser, PT ?11/25/2021, 6:22 PM ? ?[facility] ?[facility] [place] [place] ?[place], [place], [address] ?Phone: [phone]   Fax:  [phone] ? ?Name: Shine Scrogham ?MRN: [id number] ?Date of Birth: 1986/08/19 ? ? ? ?

## 2021-11-30 ENCOUNTER — Encounter: Payer: BC Managed Care – PPO | Admitting: Physical Therapy

## 2021-12-07 ENCOUNTER — Encounter: Payer: BC Managed Care – PPO | Admitting: Physical Therapy

## 2021-12-19 ENCOUNTER — Ambulatory Visit: Payer: BC Managed Care – PPO | Attending: Obstetrics and Gynecology | Admitting: Physical Therapy

## 2021-12-19 ENCOUNTER — Encounter: Payer: Self-pay | Admitting: Physical Therapy

## 2021-12-19 DIAGNOSIS — M6281 Muscle weakness (generalized): Secondary | ICD-10-CM | POA: Diagnosis not present

## 2021-12-19 DIAGNOSIS — M62838 Other muscle spasm: Secondary | ICD-10-CM | POA: Diagnosis not present

## 2021-12-19 NOTE — Therapy (Signed)
[facility] ?[facility] ?[place], [place], [address] ?Phone: [phone]   Fax:  [phone] ? ?Physical Therapy Treatment ? ?Patient Details  ?Name: Tara Singleton ?MRN: [id number] ?Date of Birth: 1986/05/10 ?Referring Provider (PT): Lysbeth Penner ? ? ?Encounter Date: 12/19/2021 ? ? PT End of Session - 12/19/21 1154   ? ? Visit Number 4   ? Date for PT Re-Evaluation 12/28/21   ? Authorization Type BCBS   ? PT Start Time 1151   ? PT Stop Time 1225   ? PT Time Calculation (min) 34 min   ? Activity Tolerance Patient tolerated treatment well   ? Behavior During Therapy [facility] for tasks assessed/performed   ? ?  ?  ? ?  ? ? ?Past Medical History:  ?Diagnosis Date  ? Endometriosis   ? ? ?History reviewed. No pertinent surgical history. ? ?There were no vitals filed for this visit. ? ? Subjective Assessment - 12/19/21 1159   ? ? Subjective I am still having pain during the period. I am having  spasms in my back today.   ? Pertinent History PCOS, endometriosis, laproscopic surgery when dx endo in 2018, pituitary adenoma, asthma   ? Patient Stated Goals decreased pain   ? Currently in Pain? No/denies   ? ?  ?  ? ?  ? ? ? ? ? ? ? ? ? ? ? ? ? ? ? ? ? ? ? ? ? ? ? ? ? ? ? ? ? ? ? PT Short Term Goals - 11/09/21 1107   ? ?  ? PT SHORT TERM GOAL #1  ? Title Pt will report pain decreased by at least 25%   ? Baseline definitely less , did not specify how much   ? Status Partially Met   ? ?  ?  ? ?  ? ? ? ? PT Long Term Goals - 12/19/21 1246   ? ?  ? PT LONG TERM GOAL #1  ? Title Pt will report at least 50% reduced pain   ? Status Achieved   ?  ? PT LONG TERM GOAL #2  ? Title Pt will be ind with HEP for pain management   ? Status Achieved   ?  ? PT LONG TERM GOAL #3  ? Title Pt will be able to manage working without pain flare ups even during a more challengeing work week due to improved pain management strategies   ? Status Achieved   ?  ? PT LONG TERM GOAL #4  ? Title Pt  will have normal hip ROM flexion and rotation without pain for improved functional positions and movements   ? Baseline hip movement WFL and no increased pain   ? Status Achieved   ? ?  ?  ? ?  ? ? ? ? ? ? ? ? Plan - 12/19/21 1245   ? ? Clinical Impression Statement Pt did well with treatment and reviewed HEP.  Pt is ind with final HEP and will discharge from PT today   ? PT Treatment/Interventions ADLs/Self Care Home Management;Biofeedback;Cryotherapy;Electrical Stimulation;Moist Heat;Neuromuscular re-education;Therapeutic exercise;Therapeutic activities;Taping;Patient/family education;Manual techniques;Dry needling;Passive range of motion   ? PT Next Visit Plan discharged   ? PT Home Exercise Plan Access Code: [email or web address]   ? Consulted and Agree with Plan of Care Patient   ? ?  ?  ? ?  ? ? ?Patient will benefit from skilled therapeutic intervention in order to improve the  following deficits and impairments:  Pain, Decreased coordination, Decreased range of motion, Postural dysfunction, Decreased strength, Increased muscle spasms ? ?Visit Diagnosis: ?No diagnosis found. ? ? ? ? ?Problem List ?There are no problems to display for this patient. ? ? ?Jule Ser, PT ?12/19/2021, 12:52 PM ? ?[facility] ?[facility] [place] ?[place], [place], [address] ?Phone: [phone]   Fax:  [phone] ? ?Name: Tara Singleton ?MRN: [id number] ?Date of Birth: May 27, 1986 ? ? ?PHYSICAL THERAPY DISCHARGE SUMMARY ? ?Visits from Start of Care: 4 ? ?Current functional level related to goals / functional outcomes: ? ? See above goals met ?Remaining deficits: ?See above details ?  ?Education / Equipment: ?HEP  ? ?Patient agrees to discharge. Patient goals were met. Patient is being discharged due to meeting the stated rehab goals. ? ?Gustavus Bryant, PT ?12/19/21 1:08 PM ? ? ?

## 2022-01-12 ENCOUNTER — Encounter (HOSPITAL_COMMUNITY): Payer: Self-pay

## 2022-01-12 ENCOUNTER — Ambulatory Visit (HOSPITAL_COMMUNITY)
Admission: RE | Admit: 2022-01-12 | Discharge: 2022-01-12 | Disposition: A | Discharge status: Home / Self Care | Payer: BC Managed Care – PPO | Source: Ambulatory Visit | Attending: Physician Assistant | Admitting: Physician Assistant

## 2022-01-12 ENCOUNTER — Ambulatory Visit (INDEPENDENT_AMBULATORY_CARE_PROVIDER_SITE_OTHER): Payer: BC Managed Care – PPO

## 2022-01-12 VITALS — BP 126/72 | HR 77 | Temp 98.1°F | Resp 18

## 2022-01-12 DIAGNOSIS — R109 Unspecified abdominal pain: Secondary | ICD-10-CM | POA: Diagnosis not present

## 2022-01-12 DIAGNOSIS — N200 Calculus of kidney: Secondary | ICD-10-CM | POA: Diagnosis not present

## 2022-01-12 LAB — POCT URINALYSIS DIPSTICK, ED / UC
Bilirubin Urine: NEGATIVE
Glucose, UA: NEGATIVE mg/dL
Hgb urine dipstick: NEGATIVE
Ketones, ur: NEGATIVE mg/dL
Leukocytes,Ua: NEGATIVE
Nitrite: NEGATIVE
Protein, ur: NEGATIVE mg/dL
Specific Gravity, Urine: 1.02 (ref 1.005–1.030)
Urobilinogen, UA: 0.2 mg/dL (ref 0.0–1.0)
pH: 8.5 — ABNORMAL HIGH (ref 5.0–8.0)

## 2022-01-12 MED ORDER — KETOROLAC TROMETHAMINE 30 MG/ML IJ SOLN
INTRAMUSCULAR | Status: AC
  Filled 2022-01-12: qty 1

## 2022-01-12 MED ORDER — KETOROLAC TROMETHAMINE 30 MG/ML IJ SOLN
30.0000 mg | Freq: Once | INTRAMUSCULAR | Status: AC
  Administered 2022-01-12: 30 mg via INTRAMUSCULAR

## 2022-01-12 NOTE — ED Provider Notes (Signed)
?[facility] ? ? ? ?CSN: [id number] ?Arrival date & time: 01/12/22  1215 ? ? ?  ? ?History   ?Chief Complaint ?Chief Complaint  ?Patient presents with  ? 1:30appt  ? Back Pain  ? ? ?HPI ?Tara Singleton is a 36 y.o. female.  ? ?Patient presents today with a several day history of severe left thoracic back pain.  She reports for the past several weeks she has had malodorous urine but denies any significant UTI symptoms.  She has urinary frequency but this is chronic and unchanged from baseline.  She denies hematuria, urinary urgency, dysuria, pelvic pain, abdominal pain, vomiting.  She does have ongoing nausea but this also is chronic and unchanged from baseline.  She has tried resting on her left side which provides temporary relief of symptoms.  She denies history of nephrolithiasis, complicated UTI, self-catheterization, recent urogenital procedure.  She did have pelvic floor physical therapy but completed course of treatment several months ago.  She denies any recent antibiotics.  ? ? ?Past Medical History:  ?Diagnosis Date  ? Endometriosis   ? ? ?There are no problems to display for this patient. ? ? ?History reviewed. No pertinent surgical history. ? ?OB History   ?No obstetric history on file. ?  ? ? ? ?Home Medications   ? ?Prior to Admission medications   ?Medication Sig Start Date End Date Taking? Authorizing Provider  ?FLUoxetine (PROZAC) 20 MG capsule 80 mg. 07/24/20  Yes [provider]  ?hydrOXYzine (ATARAX) 25 MG tablet Take by mouth. 03/17/21  Yes [provider]  ?montelukast (SINGULAIR) 10 MG tablet Take 1 tablet by mouth at bedtime. 04/17/16  Yes [provider]  ?rizatriptan (MAXALT) 10 MG tablet Take by mouth. 04/12/20  Yes [provider]  ?topiramate (TOPAMAX) 25 MG tablet Take 1 qhs x 1 week, 2 qhs x 1 week, then 3 qhs to maintain 08/25/20  Yes [provider]  ?baclofen (LIORESAL) 10 MG tablet Take 10 mg by mouth. As needed    [provider]  ?cetirizine (ZYRTEC) 10 MG tablet Take by mouth.    [provider]  ?oxyCODONE-acetaminophen (PERCOCET/ROXICET) 5-325 MG tablet Take 1 tablet by mouth every 6 (six) hours as needed for severe pain. 09/17/21   Luna Fuse, MD  ?[facility] HFA [phone] [place]) MCG/ACT inhaler Inhale into the lungs. 07/26/21   [provider]  ?verapamil (CALAN-SR) 120 MG CR tablet Take 120 mg by mouth at bedtime. 12/28/21   [provider]  ? ? ?Family History ?Family History  ?Problem Relation Age of Onset  ? Asthma Father   ? ? ?Social History ?Social History  ? ?Tobacco Use  ? Smoking status: Never  ? Smokeless tobacco: Never  ?Substance Use Topics  ? Alcohol use: Never  ? Drug use: Never  ? ? ? ?Allergies   ?Macrobid [nitrofurantoin] and Sulfa antibiotics ? ? ?Review of Systems ?Review of Systems  ?Constitutional:  Positive for activity change. Negative for appetite change, fatigue and fever.  ?Respiratory:  Negative for cough and shortness of breath.   ?Cardiovascular:  Negative for chest pain.  ?Gastrointestinal:  Positive for nausea (Chronic). Negative for abdominal pain, diarrhea and vomiting.  ?Genitourinary:  Positive for flank pain, frequency and vaginal discharge (Chronic). Negative for dysuria, urgency, vaginal bleeding and vaginal pain.  ?Musculoskeletal:  Positive for back pain. Negative for arthralgias and myalgias.  ?Neurological:  Negative for dizziness, light-headedness and headaches.  ? ? ?Physical Exam ?Triage Vital  Signs ?ED Triage Vitals [01/12/22 1328]  ?Enc Vitals Group  ?   BP 126/72  ?   Pulse Rate 77  ?   Resp 18  ?   Temp 98.1 ?F (36.7 ?C)  ?   Temp Source Oral  ?   SpO2 100 %  ?   Weight   ?   Height   ?   Head Circumference   ?   Peak Flow   ?   Pain Score 3  ?   Pain Loc   ?   Pain Edu?   ?   Excl. in [place]?   ? ?No data found. ? ?Updated Vital Signs ?BP 126/72 (BP Location: Left Arm)   Pulse 77   Temp 98.1 ?F (36.7 ?C) (Oral)   Resp 18   SpO2 100%  ? ?Visual  Acuity ?Right Eye Distance:   ?Left Eye Distance:   ?Bilateral Distance:   ? ?Right Eye Near:   ?Left Eye Near:    ?Bilateral Near:    ? ?Physical Exam ?Vitals reviewed.  ?Constitutional:   ?   General: She is awake. She is not in acute distress. ?   Appearance: Normal appearance. She is well-developed. She is not ill-appearing.  ?   Comments: Very pleasant female appears stated age in no acute distress sitting comfortably on exam room table  ?HENT:  ?   Head: Normocephalic and atraumatic.  ?Cardiovascular:  ?   Rate and Rhythm: Normal rate and regular rhythm.  ?   Heart sounds: Normal heart sounds, S1 normal and S2 normal. No murmur heard. ?Pulmonary:  ?   Effort: Pulmonary effort is normal.  ?   Breath sounds: Normal breath sounds. No wheezing, rhonchi or rales.  ?   Comments: Clear to auscultation bilaterally ?Abdominal:  ?   General: Bowel sounds are normal.  ?   Palpations: Abdomen is soft.  ?   Tenderness: There is no abdominal tenderness. There is left CVA tenderness. There is no right CVA tenderness, guarding or rebound.  ?Musculoskeletal:  ?   Cervical back: No tenderness or bony tenderness.  ?   Thoracic back: Tenderness present. No bony tenderness.  ?   Lumbar back: No tenderness or bony tenderness.  ?Psychiatric:     ?   Behavior: Behavior is cooperative.  ? ? ? ?UC Treatments / Results  ?Labs ?(all labs ordered are listed, but only abnormal results are displayed) ?Labs Reviewed  ?POCT URINALYSIS DIPSTICK, ED / UC - Abnormal; Notable for the following components:  ?    Result Value  ? pH 8.5 (*)   ? All other components within normal limits  ?URINE CULTURE  ? ? ?EKG ? ? ?Radiology ?DG Abdomen 1 View ? ?Result Date: 01/12/2022 ?CLINICAL DATA:  Left flank pain.  Nephrolithiasis. EXAM: ABDOMEN - 1 VIEW COMPARISON:  None Available. FINDINGS: The bowel gas pattern is normal. 4 mm radiodensity seen overlying the lower pole the left kidney, consistent with renal calculus. IMPRESSION: 4 mm left renal calculus.  Normal bowel gas pattern. Electronically Signed   By: Marlaine Hind M.D.   On: 01/12/2022 14:14   ? ?Procedures ?Procedures (including critical care time) ? ?Medications Ordered in UC ?Medications  ?ketorolac (TORADOL) 30 MG/ML injection 30 mg (has no administration in time range)  ? ? ?Initial Impression / Assessment and Plan / UC Course  ?I have reviewed the triage vital signs and the nursing notes. ? ?Pertinent labs & imaging results that were available  during my care of the patient were reviewed by me and considered in my medical decision making (see chart for details). ? ?  ? ?Patient is well-appearing, afebrile, nontoxic, nontachycardic.  UA showed elevated pH otherwise was normal.  KUB obtained which showed 4 mm left ureteral stone which is likely explanation for patient's symptoms.  Will obtain urine culture but defer antibiotics until results are available.  Unfortunately, she is unable to take Flomax due to concern for cross reaction from known hypersensitivity reaction to sulfa antibiotics.  She was given Toradol in clinic with instruction to avoid NSAIDs for 12 hours.  Recommended she push fluids.  She was given urine strainer and encouraged to try to catch stone.  Discussed that if symptoms do not improving quickly she is to follow-up with urology was given contact information for local provider with instruction to call to schedule an appointment.  If anything worsens and she develops severe flank pain, fever, nausea, vomiting she is to go to the emergency room to which she expressed understanding.  Strict return precautions given.  Work excuse note provided. ? ?Final Clinical Impressions(s) / UC Diagnoses  ? ?Final diagnoses:  ?Left flank pain  ?Nephrolithiasis  ? ? ? ?Discharge Instructions   ? ?  ?Your x-ray showed a kidney stone which is likely causing your pain.  Unfortunately, we cannot use Flomax since you are allergic to sulfa medications.  We did give you Toradol to help with your pain today.   Please do not take any NSAIDs including aspirin, ibuprofen/Advil, naproxen/Aleve for 12 hours after this shot.  Strain your urine as we discussed.  Make sure you are pushing fluids.  If anything worsens you need to be see

## 2022-01-12 NOTE — ED Triage Notes (Signed)
One week h/o strong urine odor and onset yesterday of intermittent thoracic back pain. Notes some vaginal irritation today. Has been taking tylenol. No hematuria or urinary frequency. Pt is suspicious for a kidney stone.   ?

## 2022-01-12 NOTE — Discharge Instructions (Signed)
Your x-ray showed a kidney stone which is likely causing your pain.  Unfortunately, we cannot use Flomax since you are allergic to sulfa medications.  We did give you Toradol to help with your pain today.  Please do not take any NSAIDs including aspirin, ibuprofen/Advil, naproxen/Aleve for 12 hours after this shot.  Strain your urine as we discussed.  Make sure you are pushing fluids.  If anything worsens you need to be seen in the emergency room.  If your symptoms or not improving please follow-up with urology; call to schedule an appointment. ?

## 2022-01-13 LAB — URINE CULTURE

## 2022-01-16 DIAGNOSIS — R1084 Generalized abdominal pain: Secondary | ICD-10-CM | POA: Diagnosis not present

## 2022-01-16 DIAGNOSIS — N200 Calculus of kidney: Secondary | ICD-10-CM | POA: Diagnosis not present

## 2022-01-18 ENCOUNTER — Other Ambulatory Visit: Payer: Self-pay | Admitting: Urology

## 2022-01-24 ENCOUNTER — Encounter (HOSPITAL_BASED_OUTPATIENT_CLINIC_OR_DEPARTMENT_OTHER): Payer: Self-pay | Admitting: Urology

## 2022-01-24 NOTE — Progress Notes (Signed)
Left VM to return call to [facility]

## 2022-01-24 NOTE — Progress Notes (Signed)
Returned call. Instructions given. Arrival time 0600. Clear liquids until 0400. NPO after MN. Wife is the driver. Went over Hx and meds

## 2022-01-25 NOTE — H&P (Signed)
Office Visit Report     01/16/2022   --------------------------------------------------------------------------------   Tara Singleton  MRN: [id number]  DOB: Nov 21, 1985, 36 year old Female  SSN:    PRIMARY CARE:    REFERRING:    PROVIDER:  Rexene Alberts, M.D.  LOCATION:  [facility] [phone]     --------------------------------------------------------------------------------   CC/HPI: Tara Singleton is a 36 year old female who is seen in consultation today for urolithiasis.   1. Urolithiasis:  -CT A/P 09/17/2021 with 5 mm nonobstructing left renal stone.  -She developed several day history of left-sided flank pain and presented to an urgent care on 01/12/2022. This pain is not well controlled however she is requesting pain medication. She denies fevers, chills, dysuria, urgency.  -KUB 01/12/2022 with 4 mm stone overlying the lower pole of the left kidney  -She denies fevers, chills, dysuria. Urinalysis today is without sign of infection.   She denies history of recurrent urinary tract infections or pyelonephritis.     ALLERGIES: Wendee Copp - Headache, Anaphylaxis, Hives, Respiratory Distress, Trouble Breathing, Dizziness Kiwi - Trouble Breathing, Nausea, Vomiting, Itching, Hives Latex - Skin Rash, Trouble Breathing, Hives, Redness Macrobid - Skin Rash, Hives, Headache, Redness Shellfish - Dizziness, Vomiting, Trouble Breathing, Itching, Headache, Swelling, Nausea, Anaphylaxis, Hives, Respiratory Distress Sulfa - Skin Rash, Nausea, Headache, Hives    MEDICATIONS: BACLOFEN PO PRN  CETIRIZINE HCL PO Daily  EPINEPHRINE IJ PRN  FLUOXETINE HCL PO Daily  LORATADINE PO PRN  ONDANSETRON ODT PO PRN  PROAIR DIGIHALER IH PRN  RIZATRIPTAN PO PRN  VERAPAMIL ER PO Daily  VITAMIN D3 PO Daily     GU PSH: None   NON-GU PSH: Tonsillectomy..     GU PMH: Endometriosis Ot - 2018    NON-GU PMH: Asthma    FAMILY HISTORY: Asthma - Father Breast Cancer - Runs In  Family Congestive Heart Failure - Mother Uterine Cancer - Mother   SOCIAL HISTORY: Marital Status: Married Current Smoking Status: Patient does not smoke anymore. Has not smoked since 01/16/2012. Smoked less than 1/2 pack per day.  Does not use smokeless tobacco. Has not drank since 01/16/2020. Had 2 drinks per month. Does not use drugs. Drinks 3 caffeinated drinks per day. Has not had a blood transfusion. Patient's occupation Herbalist .    REVIEW OF SYSTEMS:    GU Review Female:   Patient denies frequent urination, hard to postpone urination, burning /pain with urination, get up at night to urinate, leakage of urine, stream starts and stops, trouble starting your stream, have to strain to urinate, and being pregnant.  Gastrointestinal (Upper):   Patient denies nausea, vomiting, and indigestion/ heartburn.  Gastrointestinal (Lower):   Patient denies diarrhea and constipation.  Constitutional:   Patient denies fever, night sweats, weight loss, and fatigue.  Skin:   Patient denies skin rash/ lesion and itching.  Eyes:   Patient denies blurred vision and double vision.  Ears/ Nose/ Throat:   Patient denies sore throat and sinus problems.  Hematologic/Lymphatic:   Patient denies swollen glands and easy bruising.  Cardiovascular:   Patient denies leg swelling and chest pains.  Respiratory:   Patient denies cough and shortness of breath.  Endocrine:   Patient denies excessive thirst.  Musculoskeletal:   Patient denies joint pain and back pain.  Neurological:   Patient denies headaches and dizziness.  Psychologic:   Patient denies depression and anxiety.   VITAL SIGNS:      01/16/2022 11:35 AM  Weight 275  lb / 124.74 kg  Height 66 in / 167.64 cm  BP 121/78 mmHg  Pulse 80 /min  Temperature 97.5 F / 36.3 C  BMI 44.4 kg/m   MULTI-SYSTEM PHYSICAL EXAMINATION:    Constitutional: Well-nourished. No physical deformities. Normally developed. Good grooming.   Respiratory: No labored breathing, no use of accessory muscles.   Cardiovascular: Normal temperature, normal extremity pulses, no swelling, no varicosities.  Gastrointestinal: No mass, no tenderness, no rigidity, non obese abdomen. No CVA tenderness     Complexity of Data:  Source Of History:  Patient, Medical Record Summary  Records Review:   Previous Doctor Records  Urine Test Review:   Urinalysis  X-Ray Review: KUB: Reviewed Films. Reviewed Report. Discussed With Patient.  C.T. Abdomen/Pelvis: Reviewed Films. Reviewed Report. Discussed With Patient.    Notes:                     CLINICAL DATA: Left flank pain. Nephrolithiasis.   EXAM:  ABDOMEN - 1 VIEW   COMPARISON: None Available.   FINDINGS:  The bowel gas pattern is normal. 4 mm radiodensity seen overlying  the lower pole the left kidney, consistent with renal calculus.   IMPRESSION:  4 mm left renal calculus.   Normal bowel gas pattern.    Electronically Signed  By: Marlaine Hind M.D.  On: 01/12/2022 14:14  CLINICAL DATA: Left lower quadrant abdominal pain. Free fluid in  the pelvis.   EXAM:  CT ABDOMEN AND PELVIS WITH CONTRAST   TECHNIQUE:  Multidetector CT imaging of the abdomen and pelvis was performed  using the standard protocol following bolus administration of  intravenous contrast.   CONTRAST: 131m OMNIPAQUE IOHEXOL 300 MG/ML SOLN   COMPARISON: Ultrasound pelvis 09/17/2021.   FINDINGS:  Lower chest: No acute abnormality.   Hepatobiliary: No focal liver abnormality is seen. No gallstones,  gallbladder wall thickening, or biliary dilatation.   Pancreas: Unremarkable. No pancreatic ductal dilatation or  surrounding inflammatory changes.   Spleen: Normal in size without focal abnormality.   Adrenals/Urinary Tract: There is a 5 mm nonobstructing left renal  calculus. Otherwise, adrenal glands, kidneys and bladder are within  limits.   Stomach/Bowel: Stomach is within normal limits. Appendix  appears  normal. No evidence of bowel wall thickening, distention, or  inflammatory changes.   Vascular/Lymphatic: No significant vascular findings are present. No  enlarged abdominal or pelvic lymph nodes.   Reproductive: Uterus and bilateral adnexa are unremarkable.   Other: There is a small amount of free fluid in the pelvis. There is  no focal abdominal wall hernia.   Musculoskeletal: No acute or significant osseous findings.   IMPRESSION:  1. Small amount of free fluid in the pelvis may be physiologic.   2. No other acute localizing process in the abdomen or pelvis.   3. Nonobstructing left renal calculus.    Electronically Signed  By: ARonney AstersM.D.  On: 09/17/2021 23:06     PROCEDURES:          Urinalysis - 81003 Dipstick Dipstick Cont'd  Specimen: Voided Bilirubin: Neg  Color: Yellow Ketones: Neg  Appearance: Clear Blood: Neg  Specific Gravity: 1.025 Protein: Trace  pH: 6.0 Urobilinogen: 0.2  Glucose: Neg Nitrites: Neg    Leukocyte Esterase: Neg         Urinalysis - 81003 Dipstick Dipstick Cont'd  Color: Yellow Bilirubin: Neg mg/dL  Appearance: Clear Ketones: Neg mg/dL  Specific Gravity: 1.025 Blood: Neg ery/uL  pH: 6.0 Protein: Trace mg/dL  Glucose: Neg mg/dL Urobilinogen: 0.2 mg/dL    Nitrites: Neg    Leukocyte Esterase: Neg leu/uL    ASSESSMENT:      ICD-10 Details  1 GU:   Renal calculus - N20.0   2   Flank Pain - R10.84    PLAN:            Medications New Meds: Percocet 5 mg-325 mg tablet 1 tablet PO Q 4 H PRN   #20  0 Refill(s)  Pharmacy Name:  [facility]  Address:  [address]   [place], [place] [address]  Phone:  [phone]  Fax:  [phone]            Orders Labs CULTURE, URINE          Document Letter(s):  Created for Patient: Clinical Summary         Notes:    #1. Left renal stone:  -CT A/P 09/17/2021 with 5 mm nonobstructing left renal stone.  -KUB 01/12/2022 with 4 mm left renal stone  -Discussed  options for treatment including ESWL and ureteroscopy with laser lithotripsy. She like to proceed with ESWL. We will arrange for next available.   She denies taking anticoagulation.  We discussed the options for management of kidney stones, including observation, ESWL, ureteroscopy with laser lithotripsy, and PCNL. The risks and benefits of each option were discussed.  For observation I described the risks which include but are not limited to silent renal damage, life-threatening infection, need for emergent surgery, failure to pass stone, and pain.   ESWL: risks and benefits of ESWL were outlined including infection, bleeding, pain, steinstrasse, kidney injury, need for ancillary treatments, and global anesthesia risks including but not limited to CVA, MI, DVT, PE, pneumonia, and death.   Ureteroscopy: risks and benefits of ureteroscopy were outlined, including infection, bleeding, pain, temporary ureteral stent and associated stent bother, ureteral injury, ureteral stricture, need for ancillary treatments, and global anesthesia risks including but not limited to CVA, MI, DVT, PE, pneumonia, and death.   PCNL: risks and benefits of PCNL were outlined including infection, bleeding, blood transfusion, pain, pneumothorax, bowel injury, persistent urine leak, positioning injury, inability to clear stone burden, renal laceration, arterial venous fistula or malformation, need for ancillary treatments, and global anesthesia risks including but not limited to CVA, MI, DVT, PE, pneumonia, and death.    We discussed dietary methods for stone prevention including the following: increased water intake to 2-3 liters per day, add lemon or lemon concentrate to water to increase citrate which is beneficial for stone prevention, limiting dietary sodium to less than 2000 mg per day, limiting animal protein to less than 2 servings (16 ounces/day), and limiting foods high in oxalate content (spinach, beans, chocolate,  etc.).    * Signed by Rexene Alberts, M.D. on 01/16/22 at 12:00 PM (EDT)*

## 2022-01-28 ENCOUNTER — Encounter (HOSPITAL_BASED_OUTPATIENT_CLINIC_OR_DEPARTMENT_OTHER)
Admission: RE | Disposition: A | Discharge status: Home / Self Care | Payer: Self-pay | Source: Home / Self Care | Attending: Urology

## 2022-01-28 ENCOUNTER — Ambulatory Visit (HOSPITAL_BASED_OUTPATIENT_CLINIC_OR_DEPARTMENT_OTHER)
Admission: RE | Admit: 2022-01-28 | Discharge: 2022-01-28 | Disposition: A | Discharge status: Home / Self Care | Payer: BC Managed Care – PPO | Attending: Urology | Admitting: Urology

## 2022-01-28 ENCOUNTER — Ambulatory Visit (HOSPITAL_COMMUNITY): Payer: BC Managed Care – PPO

## 2022-01-28 ENCOUNTER — Encounter (HOSPITAL_BASED_OUTPATIENT_CLINIC_OR_DEPARTMENT_OTHER): Payer: Self-pay | Admitting: Urology

## 2022-01-28 ENCOUNTER — Other Ambulatory Visit: Payer: Self-pay

## 2022-01-28 DIAGNOSIS — N200 Calculus of kidney: Secondary | ICD-10-CM | POA: Diagnosis not present

## 2022-01-28 DIAGNOSIS — Z87442 Personal history of urinary calculi: Secondary | ICD-10-CM | POA: Insufficient documentation

## 2022-01-28 DIAGNOSIS — Z6841 Body Mass Index (BMI) 40.0 and over, adult: Secondary | ICD-10-CM | POA: Insufficient documentation

## 2022-01-28 DIAGNOSIS — E669 Obesity, unspecified: Secondary | ICD-10-CM | POA: Insufficient documentation

## 2022-01-28 DIAGNOSIS — Z87891 Personal history of nicotine dependence: Secondary | ICD-10-CM | POA: Insufficient documentation

## 2022-01-28 DIAGNOSIS — Z01818 Encounter for other preprocedural examination: Secondary | ICD-10-CM

## 2022-01-28 DIAGNOSIS — J45909 Unspecified asthma, uncomplicated: Secondary | ICD-10-CM | POA: Insufficient documentation

## 2022-01-28 HISTORY — DX: Unspecified asthma, uncomplicated: J45.909

## 2022-01-28 HISTORY — DX: Anxiety disorder, unspecified: F41.9

## 2022-01-28 HISTORY — DX: Depression, unspecified: F32.A

## 2022-01-28 HISTORY — DX: Gastro-esophageal reflux disease without esophagitis: K21.9

## 2022-01-28 HISTORY — DX: Headache, unspecified: R51.9

## 2022-01-28 HISTORY — PX: EXTRACORPOREAL SHOCK WAVE LITHOTRIPSY: SHX1557

## 2022-01-28 LAB — POCT PREGNANCY, URINE: Preg Test, Ur: NEGATIVE

## 2022-01-28 SURGERY — LITHOTRIPSY, ESWL
Anesthesia: LOCAL | Laterality: Left

## 2022-01-28 MED ORDER — SODIUM CHLORIDE 0.9 % IV SOLN: INTRAVENOUS | Status: DC

## 2022-01-28 MED ORDER — DIAZEPAM 5 MG PO TABS
10.0000 mg | ORAL_TABLET | ORAL | Status: AC
  Administered 2022-01-28: 10 mg via ORAL

## 2022-01-28 MED ORDER — DIAZEPAM 5 MG PO TABS
ORAL_TABLET | ORAL | Status: AC
  Filled 2022-01-28: qty 2

## 2022-01-28 MED ORDER — DIPHENHYDRAMINE HCL 25 MG PO CAPS
ORAL_CAPSULE | ORAL | Status: AC
  Filled 2022-01-28: qty 1

## 2022-01-28 MED ORDER — CIPROFLOXACIN HCL 500 MG PO TABS
500.0000 mg | ORAL_TABLET | ORAL | Status: AC
  Administered 2022-01-28: 500 mg via ORAL

## 2022-01-28 MED ORDER — DIPHENHYDRAMINE HCL 25 MG PO CAPS
25.0000 mg | ORAL_CAPSULE | ORAL | Status: AC
  Administered 2022-01-28: 25 mg via ORAL

## 2022-01-28 MED ORDER — CIPROFLOXACIN HCL 500 MG PO TABS
ORAL_TABLET | ORAL | Status: AC
  Filled 2022-01-28: qty 1

## 2022-01-28 MED ORDER — OXYCODONE-ACETAMINOPHEN 5-325 MG PO TABS: 1.0000 | ORAL_TABLET | ORAL | 0 refills | Status: DC | PRN

## 2022-01-28 NOTE — Interval H&P Note (Signed)
History and Physical Interval Note:  01/28/2022 7:19 AM  Tara Singleton  has presented today for surgery, with the diagnosis of LEFT RENAL STONE, LOWER POLE STONE.  The various methods of treatment have been discussed with the patient and family. After consideration of risks, benefits and other options for treatment, the patient has consented to  Procedure(s): EXTRACORPOREAL SHOCK WAVE LITHOTRIPSY (ESWL) (Left) as a surgical intervention.  The patient's history has been reviewed, patient examined, no change in status, stable for surgery.  I have reviewed the patient's chart and labs.  Questions were answered to the patient's satisfaction.     [employer]

## 2022-01-28 NOTE — Op Note (Signed)
See [facility] operative note scanned into chart. Also because of the size, density, location and other factors that cannot be anticipated I feel this will likely be a staged procedure. This fact supersedes any indication in the scanned [facility] operative note to the contrary.  

## 2022-01-28 NOTE — Discharge Instructions (Addendum)
1. You should strain your urine and collect all fragments and bring them to your follow up appointment.  2. You should take your pain medication as needed.  Please call if your pain is severe to the point that it is not controlled with your pain medication. 3. You should call if you develop fever > 101 or persistent nausea or vomiting.   Post Anesthesia Home Care Instructions  Activity: Get plenty of rest for the remainder of the day. A responsible individual must stay with you for 24 hours following the procedure.  For the next 24 hours, DO NOT: -Drive a car -Paediatric nurse -Drink alcoholic beverages -Take any medication unless instructed by your physician -Make any legal decisions or sign important papers.  Meals: Start with liquid foods such as gelatin or soup. Progress to regular foods as tolerated. Avoid greasy, spicy, heavy foods. If nausea and/or vomiting occur, drink only clear liquids until the nausea and/or vomiting subsides. Call your physician if vomiting continues.

## 2022-01-29 ENCOUNTER — Encounter (HOSPITAL_BASED_OUTPATIENT_CLINIC_OR_DEPARTMENT_OTHER): Payer: Self-pay | Admitting: Urology

## 2022-02-27 ENCOUNTER — Emergency Department (HOSPITAL_BASED_OUTPATIENT_CLINIC_OR_DEPARTMENT_OTHER): Payer: BC Managed Care – PPO

## 2022-02-27 ENCOUNTER — Other Ambulatory Visit (HOSPITAL_BASED_OUTPATIENT_CLINIC_OR_DEPARTMENT_OTHER): Payer: Self-pay

## 2022-02-27 ENCOUNTER — Encounter (HOSPITAL_BASED_OUTPATIENT_CLINIC_OR_DEPARTMENT_OTHER): Payer: Self-pay | Admitting: Emergency Medicine

## 2022-02-27 ENCOUNTER — Emergency Department (HOSPITAL_BASED_OUTPATIENT_CLINIC_OR_DEPARTMENT_OTHER)
Admission: EM | Admit: 2022-02-27 | Discharge: 2022-02-27 | Disposition: A | Discharge status: Home / Self Care | Payer: BC Managed Care – PPO | Attending: Emergency Medicine | Admitting: Emergency Medicine

## 2022-02-27 ENCOUNTER — Other Ambulatory Visit: Payer: Self-pay

## 2022-02-27 DIAGNOSIS — R319 Hematuria, unspecified: Secondary | ICD-10-CM | POA: Diagnosis not present

## 2022-02-27 DIAGNOSIS — Z91040 Latex allergy status: Secondary | ICD-10-CM | POA: Insufficient documentation

## 2022-02-27 DIAGNOSIS — N1330 Unspecified hydronephrosis: Secondary | ICD-10-CM | POA: Diagnosis not present

## 2022-02-27 DIAGNOSIS — R109 Unspecified abdominal pain: Secondary | ICD-10-CM | POA: Diagnosis not present

## 2022-02-27 DIAGNOSIS — N10 Acute tubulo-interstitial nephritis: Secondary | ICD-10-CM | POA: Diagnosis not present

## 2022-02-27 DIAGNOSIS — N132 Hydronephrosis with renal and ureteral calculous obstruction: Secondary | ICD-10-CM | POA: Insufficient documentation

## 2022-02-27 LAB — CBC WITH DIFFERENTIAL/PLATELET
Abs Immature Granulocytes: 0.04 10*3/uL (ref 0.00–0.07)
Basophils Absolute: 0.1 10*3/uL (ref 0.0–0.1)
Basophils Relative: 1 %
Eosinophils Absolute: 0 10*3/uL (ref 0.0–0.5)
Eosinophils Relative: 0 %
HCT: 34.8 % — ABNORMAL LOW (ref 36.0–46.0)
Hemoglobin: 11.3 g/dL — ABNORMAL LOW (ref 12.0–15.0)
Immature Granulocytes: 0 %
Lymphocytes Relative: 9 %
Lymphs Abs: 0.9 10*3/uL (ref 0.7–4.0)
MCH: 30.1 pg (ref 26.0–34.0)
MCHC: 32.5 g/dL (ref 30.0–36.0)
MCV: 92.6 fL (ref 80.0–100.0)
Monocytes Absolute: 0.4 10*3/uL (ref 0.1–1.0)
Monocytes Relative: 4 %
Neutro Abs: 8.7 10*3/uL — ABNORMAL HIGH (ref 1.7–7.7)
Neutrophils Relative %: 86 %
Platelets: 280 10*3/uL (ref 150–400)
RBC: 3.76 MIL/uL — ABNORMAL LOW (ref 3.87–5.11)
RDW: 13.4 % (ref 11.5–15.5)
WBC: 10.1 10*3/uL (ref 4.0–10.5)
nRBC: 0 % (ref 0.0–0.2)

## 2022-02-27 LAB — BASIC METABOLIC PANEL
Anion gap: 8 (ref 5–15)
BUN: 11 mg/dL (ref 6–20)
CO2: 20 mmol/L — ABNORMAL LOW (ref 22–32)
Calcium: 8.7 mg/dL — ABNORMAL LOW (ref 8.9–10.3)
Chloride: 112 mmol/L — ABNORMAL HIGH (ref 98–111)
Creatinine, Ser: 1.06 mg/dL — ABNORMAL HIGH (ref 0.44–1.00)
GFR, Estimated: >60 mL/min (ref >60)
Glucose, Bld: 105 mg/dL — ABNORMAL HIGH (ref 70–99)
Potassium: 4 mmol/L (ref 3.5–5.1)
Sodium: 140 mmol/L (ref 135–145)

## 2022-02-27 LAB — URINALYSIS, ROUTINE W REFLEX MICROSCOPIC
Bilirubin Urine: NEGATIVE
Glucose, UA: NEGATIVE mg/dL
Ketones, ur: NEGATIVE mg/dL
Leukocytes,Ua: NEGATIVE
Nitrite: NEGATIVE
Protein, ur: 30 mg/dL — AB
Specific Gravity, Urine: 1.025 (ref 1.005–1.030)
pH: 8.5 — ABNORMAL HIGH (ref 5.0–8.0)

## 2022-02-27 LAB — PREGNANCY, URINE: Preg Test, Ur: NEGATIVE

## 2022-02-27 MED ORDER — SODIUM CHLORIDE 0.9 % IV BOLUS
1000.0000 mL | Freq: Once | INTRAVENOUS | Status: AC
  Administered 2022-02-27: 1000 mL via INTRAVENOUS

## 2022-02-27 MED ORDER — OXYCODONE HCL 10 MG PO TABS
10.0000 mg | ORAL_TABLET | Freq: Four times a day (QID) | ORAL | 0 refills | Status: AC | PRN
  Filled 2022-02-27: qty 12, 3d supply, fill #0

## 2022-02-27 MED ORDER — OXYCODONE HCL 5 MG PO TABS
10.0000 mg | ORAL_TABLET | Freq: Once | ORAL | Status: AC
  Administered 2022-02-27: 10 mg via ORAL
  Filled 2022-02-27: qty 2

## 2022-02-27 MED ORDER — KETOROLAC TROMETHAMINE 10 MG PO TABS
10.0000 mg | ORAL_TABLET | Freq: Four times a day (QID) | ORAL | 0 refills | Status: DC | PRN
  Filled 2022-02-27: qty 20, 5d supply, fill #0

## 2022-02-27 MED ORDER — MORPHINE SULFATE (PF) 4 MG/ML IV SOLN
4.0000 mg | Freq: Once | INTRAVENOUS | Status: AC
  Administered 2022-02-27: 4 mg via INTRAVENOUS
  Filled 2022-02-27: qty 1

## 2022-02-27 MED ORDER — KETOROLAC TROMETHAMINE 15 MG/ML IJ SOLN
15.0000 mg | Freq: Once | INTRAMUSCULAR | Status: AC
  Administered 2022-02-27: 15 mg via INTRAVENOUS
  Filled 2022-02-27: qty 1

## 2022-02-27 MED ORDER — ONDANSETRON HCL 4 MG/2ML IJ SOLN
4.0000 mg | Freq: Once | INTRAMUSCULAR | Status: AC
  Administered 2022-02-27: 4 mg via INTRAVENOUS
  Filled 2022-02-27: qty 2

## 2022-02-27 NOTE — ED Triage Notes (Signed)
Pt from home c/o left flank pain that woke her up this morning accompanied by painful urination. Pt denis any fever at home, and has had 1 episode of emesis.

## 2022-02-27 NOTE — ED Provider Notes (Signed)
[facility] EMERGENCY DEPT Provider Note   CSN: [id number] Arrival date & time: 02/27/22  0849     History  Chief Complaint  Patient presents with   Flank Pain    Tara Singleton is a 36 y.o. female.  Patient is a 36 year old female with past medical history of throat lithiasis and lithotripsy on 01/28/2022 with [facility] presenting for complaints of flank pain.  Patient admits to awaking from her sleep this morning at 2 AM with left-sided flank pain.  No radiation to the abdomen, groin, or pelvis.  Admits to dysuria.  Denies hematuria.  Pain is described as severe, 10 out of 10, resulting in nausea and vomiting.  Minimal improvement with Percocet taken at home.  The history is provided by the patient. No language interpreter was used.  Flank Pain Pertinent negatives include no chest pain, no abdominal pain and no shortness of breath.       Home Medications Prior to Admission medications   Medication Sig Start Date End Date Taking? Authorizing Provider  ketorolac (TORADOL) 10 MG tablet Take 1 tablet (10 mg total) by mouth every 6 (six) hours as needed. 9/70/26  Yes Campbell Stall P, DO  oxyCODONE 10 MG TABS Take 1 tablet (10 mg total) by mouth every 6 (six) hours as needed for up to 3 days for severe pain. 02/27/22 3/78/58 Yes Campbell Stall P, DO  baclofen (LIORESAL) 10 MG tablet Take 10 mg by mouth. As needed    [provider]  cetirizine (ZYRTEC) 10 MG tablet Take by mouth.    [provider]  cholecalciferol (VITAMIN D3) 25 MCG (1000 UNIT) tablet Take 1,000 Units by mouth daily.    [provider]  FLUoxetine (PROZAC) 40 MG capsule Take 80 mg by mouth daily. 02/06/22   [provider]  hydrOXYzine (ATARAX) 25 MG tablet Take by mouth. 03/17/21   [provider]  montelukast (SINGULAIR) 10 MG tablet Take 1 tablet by mouth at bedtime. 04/17/16   [provider]  ondansetron (ZOFRAN-ODT) 4 MG disintegrating tablet  Take 4 mg by mouth every 12 (twelve) hours. 02/22/22   [provider]  oxyCODONE-acetaminophen (PERCOCET) 5-325 MG tablet Take 1 tablet by mouth every 4 (four) hours as needed for severe pain. 01/28/22 01/28/23  Raynelle Bring, MD  PROAIR HFA 108 [phone] Base) MCG/ACT inhaler Inhale into the lungs. 07/26/21   [provider]  rizatriptan (MAXALT) 10 MG tablet Take by mouth. 04/12/20   [provider]  topiramate (TOPAMAX) 100 MG tablet Take 100 mg by mouth at bedtime. 02/22/22   [provider]  verapamil (CALAN-SR) 120 MG CR tablet Take 120 mg by mouth at bedtime. 12/28/21   [provider]      Allergies    Shellfish allergy, White birch, Latex, Avocado extract allergy skin test, Macrobid [nitrofurantoin], Mango flavor, and Sulfa antibiotics    Review of Systems   Review of Systems  Constitutional:  Negative for chills and fever.  HENT:  Negative for ear pain and sore throat.   Eyes:  Negative for pain and visual disturbance.  Respiratory:  Negative for cough and shortness of breath.   Cardiovascular:  Negative for chest pain and palpitations.  Gastrointestinal:  Positive for nausea. Negative for abdominal pain and vomiting.  Genitourinary:  Positive for dysuria and flank pain. Negative for hematuria.  Musculoskeletal:  Negative for arthralgias and back pain.  Skin:  Negative for color change and rash.  Neurological:  Negative for seizures  and syncope.  All other systems reviewed and are negative.   Physical Exam Updated Vital Signs BP (!) 101/55 (BP Location: Right Arm)   Pulse 78   Temp 98.8 F (37.1 C) (Oral)   Resp 18   Ht '5\' 6"'$  (1.676 m)   Wt 127 kg   LMP 12/22/2021   SpO2 100%   BMI 45.19 kg/m  Physical Exam Vitals and nursing note reviewed.  Constitutional:      General: She is not in acute distress.    Appearance: She is well-developed.  HENT:     Head: Normocephalic and atraumatic.  Eyes:     Conjunctiva/sclera: Conjunctivae  normal.  Cardiovascular:     Rate and Rhythm: Normal rate and regular rhythm.     Heart sounds: No murmur heard. Pulmonary:     Effort: Pulmonary effort is normal. No respiratory distress.     Breath sounds: Normal breath sounds.  Abdominal:     Palpations: Abdomen is soft.     Tenderness: There is no abdominal tenderness.     Comments: Positive Lloyd sign on the left.  Musculoskeletal:        General: No swelling.     Cervical back: Neck supple.  Skin:    General: Skin is warm and dry.     Capillary Refill: Capillary refill takes less than 2 seconds.  Neurological:     Mental Status: She is alert.  Psychiatric:        Mood and Affect: Mood normal.     ED Results / Procedures / Treatments   Labs (all labs ordered are listed, but only abnormal results are displayed) Labs Reviewed  URINALYSIS, ROUTINE W REFLEX MICROSCOPIC - Abnormal; Notable for the following components:      Result Value   APPearance CLOUDY (*)    pH 8.5 (*)    Hgb urine dipstick SMALL (*)    Protein, ur 30 (*)    Bacteria, UA RARE (*)    All other components within normal limits  CBC WITH DIFFERENTIAL/PLATELET - Abnormal; Notable for the following components:   RBC 3.76 (*)    Hemoglobin 11.3 (*)    HCT 34.8 (*)    Neutro Abs 8.7 (*)    All other components within normal limits  PREGNANCY, URINE  BASIC METABOLIC PANEL    EKG None  Radiology CT Renal Stone Study  Result Date: 02/27/2022 CLINICAL DATA:  LEFT flank pain since early this morning with nausea and vomiting, history of kidney stones with lithotripsy, endometriosis EXAM: CT ABDOMEN AND PELVIS WITHOUT CONTRAST TECHNIQUE: Multidetector CT imaging of the abdomen and pelvis was performed following the standard protocol without IV contrast. RADIATION DOSE REDUCTION: This exam was performed according to the departmental dose-optimization program which includes automated exposure control, adjustment of the mA and/or kV according to patient size  and/or use of iterative reconstruction technique. COMPARISON:  09/17/2021 FINDINGS: Lower chest: Minimal dependent atelectasis RIGHT lower lobe. Hepatobiliary: Gallbladder and liver normal appearance Pancreas: Normal appearance Spleen: Normal appearance Adrenals/Urinary Tract: Adrenal glands and RIGHT kidney normal appearance. Mild LEFT renal enlargement with perinephric edema and two nonshadowing calculi. Minimal LEFT hydronephrosis. No ureteral calcification or dilatation definitely visualized. Bladder unremarkable. Stomach/Bowel: Normal appendix. Stomach and bowel loops normal appearance. Vascular/Lymphatic: Aorta normal caliber.  No adenopathy. Reproductive: Normal appearing uterus and ovaries Other: No free air or free fluid. No hernia or inflammatory process. Musculoskeletal: Normal appearance IMPRESSION: Mild LEFT renal enlargement with perinephric edema and two nonshadowing calculi. Minimal  LEFT hydronephrosis without ureteral calcification or dilatation. Findings could be due to a recently passed calculus or pyelonephritis, recommend correlation with urinalysis. Remainder of exam unremarkable. Electronically Signed   By: Lavonia Dana M.D.   On: 02/27/2022 11:26    Procedures Procedures    Medications Ordered in ED Medications  ondansetron (ZOFRAN) injection 4 mg (4 mg Intravenous Given 02/27/22 1006)  ketorolac (TORADOL) 15 MG/ML injection 15 mg (15 mg Intravenous Given 02/27/22 1012)  sodium chloride 0.9 % bolus 1,000 mL (1,000 mLs Intravenous New Bag/Given 02/27/22 1006)  morphine (PF) 4 MG/ML injection 4 mg (4 mg Intravenous Given 02/27/22 1008)    ED Course/ Medical Decision Making/ A&P                           Medical Decision Making Amount and/or Complexity of Data Reviewed Labs: ordered. Radiology: ordered.  Risk Prescription drug management.   43:51 PM 36 year old female with past medical history of throat lithiasis and lithotripsy on 01/28/2022 with [facility] presenting  for complaints of flank pain.  Is alert and oriented x3, afebrile, stable vital signs.  Physical exam demonstrates well-appearing female resting in bed.  Abdomen is soft with no reproducible tenderness.  Positive Lloyd sign on the left.  Differential diagnosis includes but is not limited to ureterolithiasis, pyelonephritis, renal cyst, renal malignancy.  IV fluids, Toradol, morphine, Zofran given for symptomatic management.  Laboratory studies stable.  Stable renal function.  UA demonstrates hematuria only.  No urinary tract infection.  CT renal stone as interpreted by radiologist demonstrates:  Mild LEFT renal enlargement with perinephric edema and two nonshadowing calculi.   Minimal LEFT hydronephrosis without ureteral calcification or dilatation.   Findings could be due to a recently passed calculus or pyelonephritis, recommend correlation with urinalysis.   Remainder of exam unremarkable.  Patient symptoms likely secondary to ureterolithiasis.  Patient recommended for close follow-up with established urologist if symptoms do not resolve in the next 3 to 5 days.  Medication for pain control sent to pharmacy.  Patient in no distress and overall condition improved here in the ED. Detailed discussions were had with the patient regarding current findings, and need for close f/u with PCP or on call doctor. The patient has been instructed to return immediately if the symptoms worsen in any way for re-evaluation. Patient verbalized understanding and is in agreement with current care plan. All questions answered prior to discharge.           Final Clinical Impression(s) / ED Diagnoses Final diagnoses:  Hydronephrosis, unspecified hydronephrosis type  Hematuria, unspecified type    Rx / DC Orders ED Discharge Orders          Ordered    oxyCODONE 10 MG TABS  Every 6 hours PRN        02/27/22 1201    ketorolac (TORADOL) 10 MG tablet  Every 6 hours PRN        02/27/22 1201               Lianne Cure, DO 86/16/83 1202

## 2022-02-27 NOTE — Discharge Instructions (Signed)
Please follow-up with urology specialist the next 3 to 5 days if symptoms do not resolve

## 2022-04-18 DIAGNOSIS — R1084 Generalized abdominal pain: Secondary | ICD-10-CM | POA: Diagnosis not present

## 2022-04-18 DIAGNOSIS — N200 Calculus of kidney: Secondary | ICD-10-CM | POA: Diagnosis not present

## 2022-04-19 DIAGNOSIS — Z9189 Other specified personal risk factors, not elsewhere classified: Secondary | ICD-10-CM | POA: Insufficient documentation

## 2022-04-19 DIAGNOSIS — R42 Dizziness and giddiness: Secondary | ICD-10-CM | POA: Diagnosis not present

## 2022-04-19 DIAGNOSIS — G43109 Migraine with aura, not intractable, without status migrainosus: Secondary | ICD-10-CM | POA: Diagnosis not present

## 2022-04-19 DIAGNOSIS — I10 Essential (primary) hypertension: Secondary | ICD-10-CM | POA: Diagnosis not present

## 2022-04-19 DIAGNOSIS — R202 Paresthesia of skin: Secondary | ICD-10-CM | POA: Diagnosis not present

## 2022-05-01 DIAGNOSIS — Z30430 Encounter for insertion of intrauterine contraceptive device: Secondary | ICD-10-CM | POA: Diagnosis not present

## 2022-05-10 DIAGNOSIS — F330 Major depressive disorder, recurrent, mild: Secondary | ICD-10-CM | POA: Diagnosis not present

## 2022-05-24 DIAGNOSIS — F330 Major depressive disorder, recurrent, mild: Secondary | ICD-10-CM | POA: Diagnosis not present

## 2022-05-31 DIAGNOSIS — N200 Calculus of kidney: Secondary | ICD-10-CM | POA: Diagnosis not present

## 2022-06-06 ENCOUNTER — Ambulatory Visit
Admission: RE | Admit: 2022-06-06 | Discharge: 2022-06-06 | Disposition: A | Discharge status: Home / Self Care | Payer: BC Managed Care – PPO | Source: Ambulatory Visit

## 2022-06-07 DIAGNOSIS — F330 Major depressive disorder, recurrent, mild: Secondary | ICD-10-CM | POA: Diagnosis not present

## 2022-06-21 ENCOUNTER — Ambulatory Visit: Payer: BC Managed Care – PPO | Admitting: Family Medicine

## 2022-06-21 DIAGNOSIS — F330 Major depressive disorder, recurrent, mild: Secondary | ICD-10-CM | POA: Diagnosis not present

## 2022-07-19 DIAGNOSIS — F330 Major depressive disorder, recurrent, mild: Secondary | ICD-10-CM | POA: Diagnosis not present

## 2022-07-26 ENCOUNTER — Encounter: Payer: Self-pay | Admitting: Family Medicine

## 2022-07-26 ENCOUNTER — Ambulatory Visit: Payer: BC Managed Care – PPO | Admitting: Family Medicine

## 2022-07-26 VITALS — BP 126/82 | HR 76 | Temp 97.8°F | Ht 66.0 in | Wt 303.0 lb

## 2022-07-26 DIAGNOSIS — M6289 Other specified disorders of muscle: Secondary | ICD-10-CM | POA: Insufficient documentation

## 2022-07-26 DIAGNOSIS — G43109 Migraine with aura, not intractable, without status migrainosus: Secondary | ICD-10-CM

## 2022-07-26 DIAGNOSIS — E559 Vitamin D deficiency, unspecified: Secondary | ICD-10-CM

## 2022-07-26 DIAGNOSIS — N200 Calculus of kidney: Secondary | ICD-10-CM | POA: Diagnosis not present

## 2022-07-26 DIAGNOSIS — R400 Somnolence: Secondary | ICD-10-CM

## 2022-07-26 DIAGNOSIS — R0683 Snoring: Secondary | ICD-10-CM

## 2022-07-26 DIAGNOSIS — E669 Obesity, unspecified: Secondary | ICD-10-CM

## 2022-07-26 DIAGNOSIS — E785 Hyperlipidemia, unspecified: Secondary | ICD-10-CM | POA: Diagnosis not present

## 2022-07-26 DIAGNOSIS — R635 Abnormal weight gain: Secondary | ICD-10-CM | POA: Diagnosis not present

## 2022-07-26 DIAGNOSIS — D352 Benign neoplasm of pituitary gland: Secondary | ICD-10-CM | POA: Diagnosis not present

## 2022-07-26 DIAGNOSIS — I10 Essential (primary) hypertension: Secondary | ICD-10-CM | POA: Diagnosis not present

## 2022-07-26 DIAGNOSIS — Z9189 Other specified personal risk factors, not elsewhere classified: Secondary | ICD-10-CM | POA: Diagnosis not present

## 2022-07-26 DIAGNOSIS — E282 Polycystic ovarian syndrome: Secondary | ICD-10-CM | POA: Insufficient documentation

## 2022-07-26 LAB — LIPID PANEL
Cholesterol: 246 mg/dL — ABNORMAL HIGH (ref 0–200)
HDL: 66 mg/dL (ref >39.00)
LDL Cholesterol: 160 mg/dL — ABNORMAL HIGH (ref 0–99)
NonHDL: 180.17
Total CHOL/HDL Ratio: 4
Triglycerides: 102 mg/dL (ref 0.0–149.0)
VLDL: 20.4 mg/dL (ref 0.0–40.0)

## 2022-07-26 LAB — CBC WITH DIFFERENTIAL/PLATELET
Basophils Absolute: 0 10*3/uL (ref 0.0–0.1)
Basophils Relative: 0.8 % (ref 0.0–3.0)
Eosinophils Absolute: 0.1 10*3/uL (ref 0.0–0.7)
Eosinophils Relative: 2.4 % (ref 0.0–5.0)
HCT: 36.3 % (ref 36.0–46.0)
Hemoglobin: 11.9 g/dL — ABNORMAL LOW (ref 12.0–15.0)
Lymphocytes Relative: 27.8 % (ref 12.0–46.0)
Lymphs Abs: 1.7 10*3/uL (ref 0.7–4.0)
MCHC: 32.7 g/dL (ref 30.0–36.0)
MCV: 92.7 fl (ref 78.0–100.0)
Monocytes Absolute: 0.4 10*3/uL (ref 0.1–1.0)
Monocytes Relative: 6.1 % (ref 3.0–12.0)
Neutro Abs: 3.8 10*3/uL (ref 1.4–7.7)
Neutrophils Relative %: 62.9 % (ref 43.0–77.0)
Platelets: 390 10*3/uL (ref 150.0–400.0)
RBC: 3.92 Mil/uL (ref 3.87–5.11)
RDW: 14 % (ref 11.5–15.5)
WBC: 6 10*3/uL (ref 4.0–10.5)

## 2022-07-26 LAB — TSH: TSH: 1.8 u[IU]/mL (ref 0.35–5.50)

## 2022-07-26 LAB — COMPREHENSIVE METABOLIC PANEL
ALT: 24 U/L (ref 0–35)
AST: 20 U/L (ref 0–37)
Albumin: 4.3 g/dL (ref 3.5–5.2)
Alkaline Phosphatase: 53 U/L (ref 39–117)
BUN: 8 mg/dL (ref 6–23)
CO2: 30 mEq/L (ref 19–32)
Calcium: 9.1 mg/dL (ref 8.4–10.5)
Chloride: 102 mEq/L (ref 96–112)
Creatinine, Ser: 0.78 mg/dL (ref 0.40–1.20)
GFR: 98.32 mL/min (ref >60.00)
Glucose, Bld: 90 mg/dL (ref 70–99)
Potassium: 3.6 mEq/L (ref 3.5–5.1)
Sodium: 138 mEq/L (ref 135–145)
Total Bilirubin: 0.3 mg/dL (ref 0.2–1.2)
Total Protein: 7 g/dL (ref 6.0–8.3)

## 2022-07-26 LAB — T4, FREE: Free T4: 0.7 ng/dL (ref 0.60–1.60)

## 2022-07-26 LAB — VITAMIN D 25 HYDROXY (VIT D DEFICIENCY, FRACTURES): VITD: 49.63 ng/mL (ref 30.00–100.00)

## 2022-07-26 LAB — HEMOGLOBIN A1C: Hgb A1c MFr Bld: 5.4 % (ref 4.6–6.5)

## 2022-07-26 LAB — VITAMIN B12: Vitamin B-12: 882 pg/mL (ref 211–911)

## 2022-07-26 NOTE — Progress Notes (Signed)
New Patient Office Visit  Subjective    Patient ID: Tara Singleton, female    DOB: 1986-03-13  Age: 36 y.o. MRN: [id number]  CC:  Chief Complaint  Patient presents with   Establish Care    Wanted baseline blood work done, hasn't had PCP in years. Also mentions she stopped topamax months ago and since then her weight has dramatically gone up     HPI [name] presents to establish care  Other providers:  Neurology- [facility]ings Psych- Dr. Sharyon Medicus  Therapist- [facility]   Would like blood work done.   States she has gained 30 lbs in the past 3 months  States she eats less.  Started on Abilify 4 weeks ago for depression. Also taking Prozac 80 mg.   Recently weaned off Topiramate due to renal stones per neurologist.  Aimovig monthly migraine prevention.   Verapamil 120 mg H/A prevention, Maxalt 10 mg prn and add motrin or baclofen and zofran  Prozac  Migraines  At risk for sleep apnea and needs sleep sutdy.   Vaped - trying to stop   Right shoulder injury and is seeing orthopedist through [facility].   Denies fever, chills, dizziness, chest pain, palpitations, shortness of breath, abdominal pain, N/V/D, urinary symptoms, LE edema.     Has a wife. Media planner for [employer].  No kids. 5 miscarriages.     Outpatient Encounter Medications as of 07/26/2022  Medication Sig   ABILIFY 2 MG tablet    baclofen (LIORESAL) 10 MG tablet Take 10 mg by mouth. As needed   cetirizine (ZYRTEC) 10 MG tablet Take by mouth.   cholecalciferol (VITAMIN D3) 25 MCG (1000 UNIT) tablet Take 1,000 Units by mouth daily.   Erenumab-aooe (AIMOVIG [place]) Inject into the skin. Inject into skin once every month for migraines.   FLUoxetine (PROZAC) 40 MG capsule Take 40 mg by mouth daily.   hydrOXYzine (ATARAX) 25 MG tablet Take by mouth.   ipratropium (ATROVENT) 0.03 % nasal spray Place 1 spray into both nostrils daily.   mometasone (NASONEX) 50 MCG/ACT nasal spray  Place 2 sprays into the nose daily.   ondansetron (ZOFRAN-ODT) 4 MG disintegrating tablet Take 4 mg by mouth every 12 (twelve) hours.   PROAIR HFA 108 (90 Base) MCG/ACT inhaler Inhale into the lungs.   rizatriptan (MAXALT) 10 MG tablet Take by mouth.   verapamil (CALAN-SR) 120 MG CR tablet Take 120 mg by mouth at bedtime.   [DISCONTINUED] ketorolac (TORADOL) 10 MG tablet Take 1 tablet (10 mg total) by mouth every 6 (six) hours as needed.   [DISCONTINUED] montelukast (SINGULAIR) 10 MG tablet Take 1 tablet by mouth at bedtime.   [DISCONTINUED] oxyCODONE-acetaminophen (PERCOCET) 5-325 MG tablet Take 1 tablet by mouth every 4 (four) hours as needed for severe pain.   [DISCONTINUED] topiramate (TOPAMAX) 100 MG tablet Take 100 mg by mouth at bedtime.   No facility-administered encounter medications on file as of 07/26/2022.    Past Medical History:  Diagnosis Date   Anxiety    Asthma    Depression    Endometriosis    GERD (gastroesophageal reflux disease)    Headache     Past Surgical History:  Procedure Laterality Date   EXTRACORPOREAL SHOCK WAVE LITHOTRIPSY Left 01/28/2022   Procedure: EXTRACORPOREAL SHOCK WAVE LITHOTRIPSY (ESWL);  Surgeon: Raynelle Bring, MD;  [facility]: [facility];  Service: Urology;  Laterality: Left;    Family History  Problem Relation Age of  Onset   Asthma Father     Social History   Socioeconomic History   Marital status: Married    Spouse name: Not on file   Number of children: Not on file   Years of education: Not on file   Highest education level: Not on file  Occupational History   Not on file  Tobacco Use   Smoking status: Never   Smokeless tobacco: Never  Substance and Sexual Activity   Alcohol use: Never   Drug use: Never   Sexual activity: Not on file  Other Topics Concern   Not on file  Social History Narrative   Not on file   Social Determinants of Health   Financial Resource Strain: Not on file  Food Insecurity:  Not on file  Transportation Needs: Not on file  Physical Activity: Not on file  Stress: Not on file  Social Connections: Not on file  Intimate Partner Violence: Not on file    ROS      Objective    BP 126/82   Pulse 76   Temp 97.8 F (36.6 C) (Temporal)   Ht '5\' 6"'$  (1.676 m)   Wt (!) 303 lb (137.4 kg)   SpO2 98%   BMI 48.91 kg/m   Physical Exam Constitutional:      General: She is not in acute distress.    Appearance: She is not ill-appearing.  Cardiovascular:     Rate and Rhythm: Normal rate.  Pulmonary:     Effort: Pulmonary effort is normal.  Skin:    General: Skin is warm and dry.  Neurological:     General: No focal deficit present.     Mental Status: She is alert and oriented to person, place, and time.  Psychiatric:        Mood and Affect: Mood normal.        Behavior: Behavior normal.         Assessment & Plan:   Problem List Items Addressed This Visit       Cardiovascular and Mediastinum   Essential hypertension    Controlled. On Verapamil. Check BMP      Relevant Orders   CBC with Differential/Platelet (Completed)   Comprehensive metabolic panel (Completed)   Migraine with aura and without status migrainosus, not intractable    Managed by neurologist. Reviewed notes and imaging results       Relevant Medications   Erenumab-aooe (AIMOVIG [place])     Endocrine   Pituitary adenoma ([place])    Seen on imaging of brain in 2019. Followed by neurologist        Other   At risk for sleep apnea - Primary    HST ordered      Relevant Orders   Home sleep test   Daytime somnolence    HST ordered. Check labs      Relevant Orders   Home sleep test   Vitamin B12 (Completed)   VITAMIN D 25 Hydroxy (Vit-D Deficiency, Fractures) (Completed)   Hyperlipidemia    Lipid panel ordered. Follow up pending results      Relevant Orders   Lipid panel (Completed)   Obesity (BMI 30-39.9)    Check labs to look for underlying etiology for weight gain  and complications from obestiy. Referral to [facility]      Relevant Orders   Lipid panel (Completed)   Hemoglobin A1c (Completed)   Snores   Relevant Orders   Home sleep test   Vitamin D deficiency  Check vitamin D level. Follow up      Relevant Orders   VITAMIN D 25 Hydroxy (Vit-D Deficiency, Fractures) (Completed)   Weight gain   Relevant Orders   CBC with Differential/Platelet (Completed)   Comprehensive metabolic panel (Completed)   TSH (Completed)   T4, free (Completed)   Other Visit Diagnoses     Renal stones           Return in about 4 weeks (around 08/23/2022).   Harland Dingwall, NP-C

## 2022-07-26 NOTE — Patient Instructions (Addendum)
Thank you for trusting [place] with your health care.  Please go downstairs for labs before you leave today.  You will hear from the [facility] regarding your home sleep test to screen for sleep apnea.  Please do a food diary for 1-2 weeks if you can so that I have a better idea what your meals and habits look like at your follow-up visit.

## 2022-08-02 DIAGNOSIS — R635 Abnormal weight gain: Secondary | ICD-10-CM | POA: Insufficient documentation

## 2022-08-02 DIAGNOSIS — E669 Obesity, unspecified: Secondary | ICD-10-CM | POA: Insufficient documentation

## 2022-08-02 DIAGNOSIS — S43431A Superior glenoid labrum lesion of right shoulder, initial encounter: Secondary | ICD-10-CM | POA: Insufficient documentation

## 2022-08-02 DIAGNOSIS — R0683 Snoring: Secondary | ICD-10-CM | POA: Insufficient documentation

## 2022-08-02 DIAGNOSIS — R400 Somnolence: Secondary | ICD-10-CM | POA: Insufficient documentation

## 2022-08-02 DIAGNOSIS — E559 Vitamin D deficiency, unspecified: Secondary | ICD-10-CM | POA: Insufficient documentation

## 2022-08-02 DIAGNOSIS — E785 Hyperlipidemia, unspecified: Secondary | ICD-10-CM | POA: Insufficient documentation

## 2022-08-02 NOTE — Assessment & Plan Note (Signed)
Lipid panel ordered. Follow up pending results

## 2022-08-02 NOTE — Assessment & Plan Note (Signed)
Controlled. On Verapamil. Check BMP

## 2022-08-02 NOTE — Assessment & Plan Note (Signed)
Seen on imaging of brain in 2019. Followed by neurologist

## 2022-08-02 NOTE — Assessment & Plan Note (Signed)
HST ordered. Check labs

## 2022-08-02 NOTE — Assessment & Plan Note (Signed)
Check labs to look for underlying etiology for weight gain and complications from obestiy. Referral to [facility]

## 2022-08-02 NOTE — Assessment & Plan Note (Signed)
Check vitamin D level. Follow up

## 2022-08-02 NOTE — Assessment & Plan Note (Signed)
Managed by neurologist. Reviewed notes and imaging results

## 2022-08-02 NOTE — Assessment & Plan Note (Signed)
HST ordered.

## 2022-08-14 DIAGNOSIS — F330 Major depressive disorder, recurrent, mild: Secondary | ICD-10-CM | POA: Diagnosis not present

## 2022-08-23 ENCOUNTER — Ambulatory Visit (HOSPITAL_COMMUNITY): Admit: 2022-08-23 | Discharge status: Absent without leave | Payer: BC Managed Care – PPO

## 2022-08-27 ENCOUNTER — Encounter (HOSPITAL_BASED_OUTPATIENT_CLINIC_OR_DEPARTMENT_OTHER): Payer: BC Managed Care – PPO | Admitting: Internal Medicine

## 2022-08-29 ENCOUNTER — Ambulatory Visit: Payer: BC Managed Care – PPO | Admitting: Family Medicine

## 2022-09-02 DIAGNOSIS — F330 Major depressive disorder, recurrent, mild: Secondary | ICD-10-CM | POA: Diagnosis not present

## 2022-09-04 ENCOUNTER — Encounter: Payer: Self-pay | Admitting: Family Medicine

## 2022-09-04 ENCOUNTER — Ambulatory Visit: Payer: BC Managed Care – PPO | Admitting: Family Medicine

## 2022-09-04 VITALS — BP 120/86 | HR 87 | Temp 98.8°F | Ht 66.0 in | Wt 303.0 lb

## 2022-09-04 DIAGNOSIS — J209 Acute bronchitis, unspecified: Secondary | ICD-10-CM | POA: Diagnosis not present

## 2022-09-04 DIAGNOSIS — Z9189 Other specified personal risk factors, not elsewhere classified: Secondary | ICD-10-CM

## 2022-09-04 DIAGNOSIS — R635 Abnormal weight gain: Secondary | ICD-10-CM

## 2022-09-04 DIAGNOSIS — E7800 Pure hypercholesterolemia, unspecified: Secondary | ICD-10-CM | POA: Diagnosis not present

## 2022-09-04 DIAGNOSIS — I10 Essential (primary) hypertension: Secondary | ICD-10-CM

## 2022-09-04 NOTE — Patient Instructions (Signed)
Let me know if you do not continue improving or if you are getting worse in regards to cough and congestion.  As discussed, make sure you are eating a low-fat diet and limiting fried foods to help your cholesterol.  Reschedule your sleep study since she will be unavailable on that date.  Let me know if you do not hear from [facility] in the next 2 to 4 weeks.  I will see you back in 6 months or sooner if needed.

## 2022-09-04 NOTE — Progress Notes (Signed)
Subjective:     Patient ID: Tara Singleton, female    DOB: August 14, 1986, 36 y.o.   MRN: [id number]  No chief complaint on file.   HPI Patient is in today for a 4-6 wk f/u on multiple health conditions.   States her muscle relaxant is making her gain fluid weight. She stopped it and lost 12 lbs. This has happened in the past with Robaxin and now with baclofen.   Going out of town for work next month.    Other providers:  Neurology- [facility] Dr Tasia Catchings Psych- Dr. Sharyon Medicus  Therapist- [facility]  OB/GYN- [facility]   C/o 2 wk hx of cough and chest congestion. Green mucous. 75-80% improved.  Took antibiotic, Z-pak, and completed it on 08/28/2022  Asthma- she was having to use her rescue inhaler more often but now not as often.   Denies fever, chills, dizziness, chest pain, palpitations, shortness of breath, abdominal pain, N/V/D, urinary symptoms   Health Maintenance Due  Topic Date Due   HIV Screening  Never done   Hepatitis C Screening  Never done   PAP SMEAR-Modifier  Never done    Past Medical History:  Diagnosis Date   Anxiety    Asthma    Depression    Endometriosis    GERD (gastroesophageal reflux disease)    Headache     Past Surgical History:  Procedure Laterality Date   EXTRACORPOREAL SHOCK WAVE LITHOTRIPSY Left 01/28/2022   Procedure: EXTRACORPOREAL SHOCK WAVE LITHOTRIPSY (ESWL);  Surgeon: Raynelle Bring, MD;  Location: [facility];  Service: Urology;  Laterality: Left;    Family History  Problem Relation Age of Onset   Asthma Father     Social History   Socioeconomic History   Marital status: Married    Spouse name: Not on file   Number of children: Not on file   Years of education: Not on file   Highest education level: Not on file  Occupational History   Not on file  Tobacco Use   Smoking status: Never   Smokeless tobacco: Never  Substance and Sexual Activity   Alcohol use: Never   Drug use: Never   Sexual activity: Not on file   Other Topics Concern   Not on file  Social History Narrative   Not on file   Social Determinants of Health   Financial Resource Strain: Not on file  Food Insecurity: Not on file  Transportation Needs: Not on file  Physical Activity: Not on file  Stress: Not on file  Social Connections: Not on file  Intimate Partner Violence: Not on file    Outpatient Medications Prior to Visit  Medication Sig Dispense Refill   baclofen (LIORESAL) 10 MG tablet Take 10 mg by mouth. As needed     cetirizine (ZYRTEC) 10 MG tablet Take by mouth.     cholecalciferol (VITAMIN D3) 25 MCG (1000 UNIT) tablet Take 1,000 Units by mouth daily.     Erenumab-aooe (AIMOVIG [place]) Inject into the skin. Inject into skin once every month for migraines.     FLUoxetine (PROZAC) 40 MG capsule Take 40 mg by mouth daily.     hydrOXYzine (ATARAX) 25 MG tablet Take by mouth.     mometasone (NASONEX) 50 MCG/ACT nasal spray Place 2 sprays into the nose daily.     ondansetron (ZOFRAN-ODT) 4 MG disintegrating tablet Take 4 mg by mouth every 12 (twelve) hours.     PROAIR HFA 108 (90 Base) MCG/ACT inhaler Inhale into  the lungs.     rizatriptan (MAXALT) 10 MG tablet Take by mouth.     verapamil (CALAN-SR) 120 MG CR tablet Take 120 mg by mouth at bedtime.     ABILIFY 5 MG tablet  (Patient not taking: Reported on 09/04/2022)     ipratropium (ATROVENT) 0.03 % nasal spray Place 1 spray into both nostrils daily.     No facility-administered medications prior to visit.    Allergies  Allergen Reactions   Avocado Hives, Nausea And Vomiting, Rash, Shortness Of Breath and Swelling   Betula [place] Oil Anaphylaxis   Kiwi Extract Dermatitis, Hives, Rash and Shortness Of Breath   Shellfish Allergy Anaphylaxis   Sulfa Antibiotics Hives, Other (See Comments) and Rash    Upset stomach    White [place]ch Anaphylaxis    Birch trees   Latex Hives   Sulfur Other (See Comments)    Welts   Avocado Extract Allergy Skin Test Hives   Macrobid  [Nitrofurantoin] Hives   Mango Flavor Hives    ROS     Objective:    Physical Exam Constitutional:      General: She is not in acute distress.    Appearance: She is not ill-appearing.  Cardiovascular:     Rate and Rhythm: Normal rate and regular rhythm.  Pulmonary:     Effort: Pulmonary effort is normal.     Breath sounds: Normal breath sounds.  Skin:    General: Skin is warm and dry.  Neurological:     General: No focal deficit present.     Mental Status: She is alert and oriented to person, place, and time.  Psychiatric:        Mood and Affect: Mood normal.        Behavior: Behavior normal.     BP 120/86 (BP Location: Left Arm, Patient Position: Sitting, Cuff Size: Normal)   Pulse 87   Temp 98.8 F (37.1 C) (Oral)   Ht '5\' 6"'$  (1.676 m)   Wt (!) 303 lb (137.4 kg)   SpO2 97%   BMI 48.91 kg/m  Wt Readings from Last 3 Encounters:  09/04/22 (!) 303 lb (137.4 kg)  07/26/22 (!) 303 lb (137.4 kg)  02/27/22 280 lb (127 kg)       Assessment & Plan:   Problem List Items Addressed This Visit       Cardiovascular and Mediastinum   Essential hypertension     Respiratory   Acute bronchitis     Other   At risk for sleep apnea   Hyperlipidemia - Primary   Weight gain   She is here for follow-up on multiple health conditions. Recent weight loss after she stopped baclofen.  She will reach out to her orthopedist in regards to pain management. She will be out of town next month for work and will need to reschedule her sleep study. Blood pressure is close to goal range. Reviewed labs with patient including elevated LDL.  Recommend low-fat, low-cholesterol diet.  Limit fried foods. She has not yet heard from [facility] and will let me know if she does not hear from them in the next month. Acute bronchitis and recently completed an antibiotic.  Improving.  She will let me know if she gets worse. Follow-up here in 6 months   I have discontinued  Tara Singleton "Vinny"'s ipratropium. I am also having her maintain her baclofen, ProAir HFA, cetirizine, hydrOXYzine, verapamil, rizatriptan, cholecalciferol, FLUoxetine, ondansetron, mometasone, Abilify, and Erenumab-aooe (AIMOVIG [place]).  No orders of the defined types were placed in this encounter.

## 2022-09-11 ENCOUNTER — Encounter (HOSPITAL_BASED_OUTPATIENT_CLINIC_OR_DEPARTMENT_OTHER): Payer: BC Managed Care – PPO | Admitting: Internal Medicine

## 2022-10-01 DIAGNOSIS — F330 Major depressive disorder, recurrent, mild: Secondary | ICD-10-CM | POA: Diagnosis not present

## 2022-10-14 ENCOUNTER — Ambulatory Visit (HOSPITAL_BASED_OUTPATIENT_CLINIC_OR_DEPARTMENT_OTHER): Payer: BC Managed Care – PPO | Attending: Family Medicine | Admitting: Internal Medicine

## 2022-10-14 VITALS — Ht 66.0 in | Wt 302.0 lb

## 2022-10-14 DIAGNOSIS — G4733 Obstructive sleep apnea (adult) (pediatric): Secondary | ICD-10-CM

## 2022-10-14 DIAGNOSIS — Z9189 Other specified personal risk factors, not elsewhere classified: Secondary | ICD-10-CM | POA: Diagnosis not present

## 2022-10-14 DIAGNOSIS — R400 Somnolence: Secondary | ICD-10-CM

## 2022-10-14 DIAGNOSIS — R0683 Snoring: Secondary | ICD-10-CM | POA: Diagnosis not present

## 2022-10-19 DIAGNOSIS — Z9189 Other specified personal risk factors, not elsewhere classified: Secondary | ICD-10-CM

## 2022-10-19 NOTE — Procedures (Signed)
    Patient Name: Tara Singleton, Tara Singleton Date: 10/15/2022 Gender: Female D.O.B: 02/16/1986 Age (years): 36 Referring Provider: Girtha Rm NP Height (inches): 63 Interpreting Physician: Baird Lyons MD, ABSM Weight (lbs): 303 RPSGT: Jacolyn Reedy BMI: 49 MRN: [id number] Neck Size: 15.50  CLINICAL INFORMATION Sleep Study Type: HST Indication for sleep study: Weight gain, OSA Epworth Sleepiness Score: 6  SLEEP STUDY TECHNIQUE A multi-channel overnight portable sleep study was performed. The channels recorded were: nasal airflow, thoracic respiratory movement, and oxygen saturation with a pulse oximetry. Snoring was also monitored.  MEDICATIONS Patient self administered medications include: N/A.  SLEEP ARCHITECTURE Patient was studied for 414.2 minutes. The sleep efficiency was 100.0 % and the patient was supine for 0%. The arousal index was 0.0 per hour.  RESPIRATORY PARAMETERS The overall AHI was 5.2 per hour, with a central apnea index of 0 per hour. The oxygen nadir was 86% during sleep.  CARDIAC DATA Mean heart rate during sleep was 80.1 bpm.  IMPRESSIONS - Minimal obstructive sleep apnea occurred during this study (AHI = 5.2/h). - Moderate oxygen desaturation was noted during this study (Min O2 = 86%,  Mean 96%). - Patient snored.  DIAGNOSIS - Obstructive Sleep Apnea (G47.33)  RECOMMENDATIONS - Treatment for very mild OSA is guided by symptoms and co-morbidity. Conservative measures may include observation, weight loss and sleep position off back. Other options including CPAP, a fitted oral appliance, or ENT evaluation would be based on clinical judgment. - Be careful with alcohol, sedatives and other CNS depressants that may worsen sleep apnea and disrupt normal sleep architecture. - Sleep hygiene should be reviewed to assess factors that may improve sleep quality. - Weight management and regular exercise should be initiated or  continued.  [Electronically signed] 10/19/2022 12:04 PM  Baird Lyons MD, [place], American Board of Sleep Medicine NPI: [id number]                         [place], [place] [facility]  ELECTRONICALLY SIGNED ON:  10/19/2022, 11:59 AM [place] PH: [phone]   FX: [phone] [place]

## 2022-10-23 ENCOUNTER — Telehealth: Payer: Self-pay

## 2022-10-23 NOTE — Telephone Encounter (Signed)
-----   Message from Girtha Rm, NP-C sent at 10/22/2022  4:44 PM EST ----- Her sleep study shows mild sleep apnea. RECOMMENDATIONS - Treatment for very mild OSA is guided by symptoms and co-morbidity. Conservative measures may include observation, weight loss and sleep position off back. Other options including CPAP. Please see if she would like to try conservative measures or prefers to proceed with a CPAP machine.   ----- Message ----- From: Deneise Lever, MD Sent: 10/19/2022  12:08 PM EST To: Girtha Rm, NP-C

## 2022-10-23 NOTE — Telephone Encounter (Signed)
Called pt and she states at this time she would like to try the conservative measures first and if she has any issues or doesn't see any improvement she will discuss starting on CPAP machine.

## 2022-11-08 DIAGNOSIS — F330 Major depressive disorder, recurrent, mild: Secondary | ICD-10-CM | POA: Diagnosis not present

## 2022-12-14 DIAGNOSIS — F330 Major depressive disorder, recurrent, mild: Secondary | ICD-10-CM | POA: Diagnosis not present

## 2022-12-28 DIAGNOSIS — F330 Major depressive disorder, recurrent, mild: Secondary | ICD-10-CM | POA: Diagnosis not present

## 2022-12-30 IMAGING — DX DG ABDOMEN 1V
2 series · 2 of 2 positions shown · non-contrast
Comparison: [DATE] [DATE], [DATE].  [DATE] [DATE], [DATE].

CLINICAL DATA: Left-sided kidney stone.

EXAM:
ABDOMEN - 1 VIEW

[abdomen kub (1 of 2)]
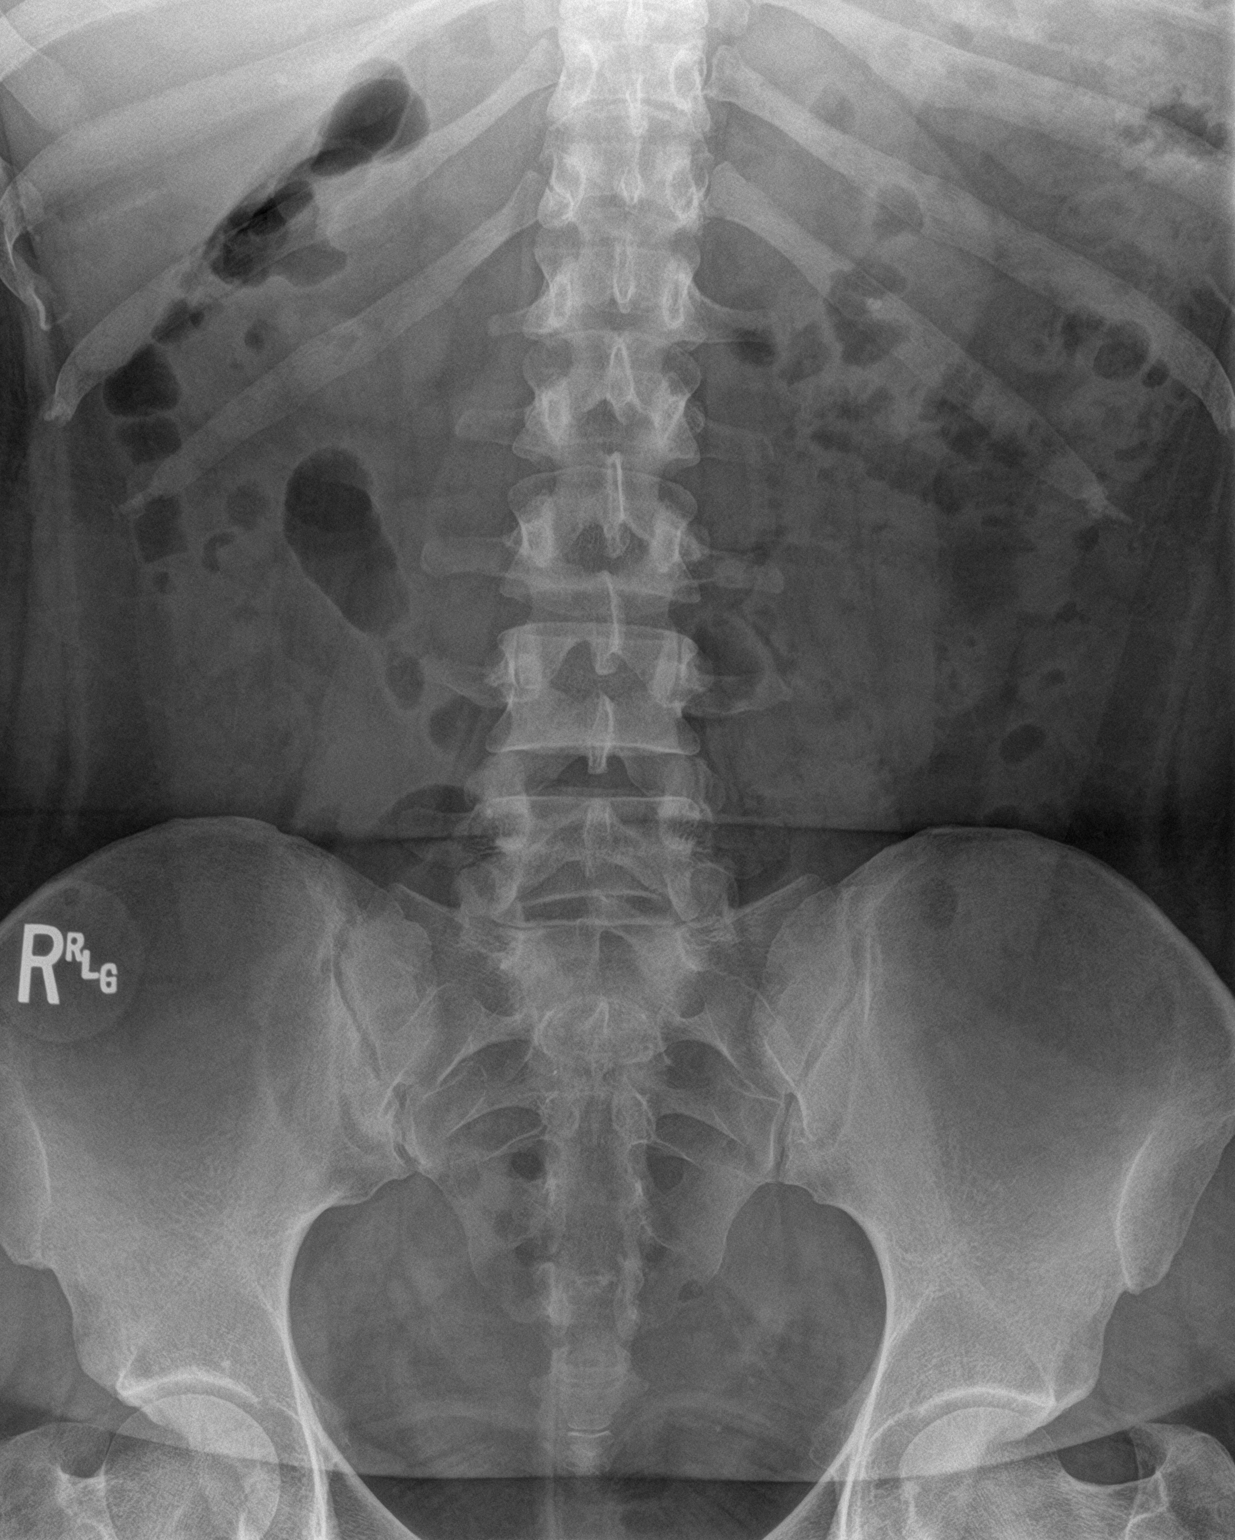

[abdomen kub (2 of 2)]
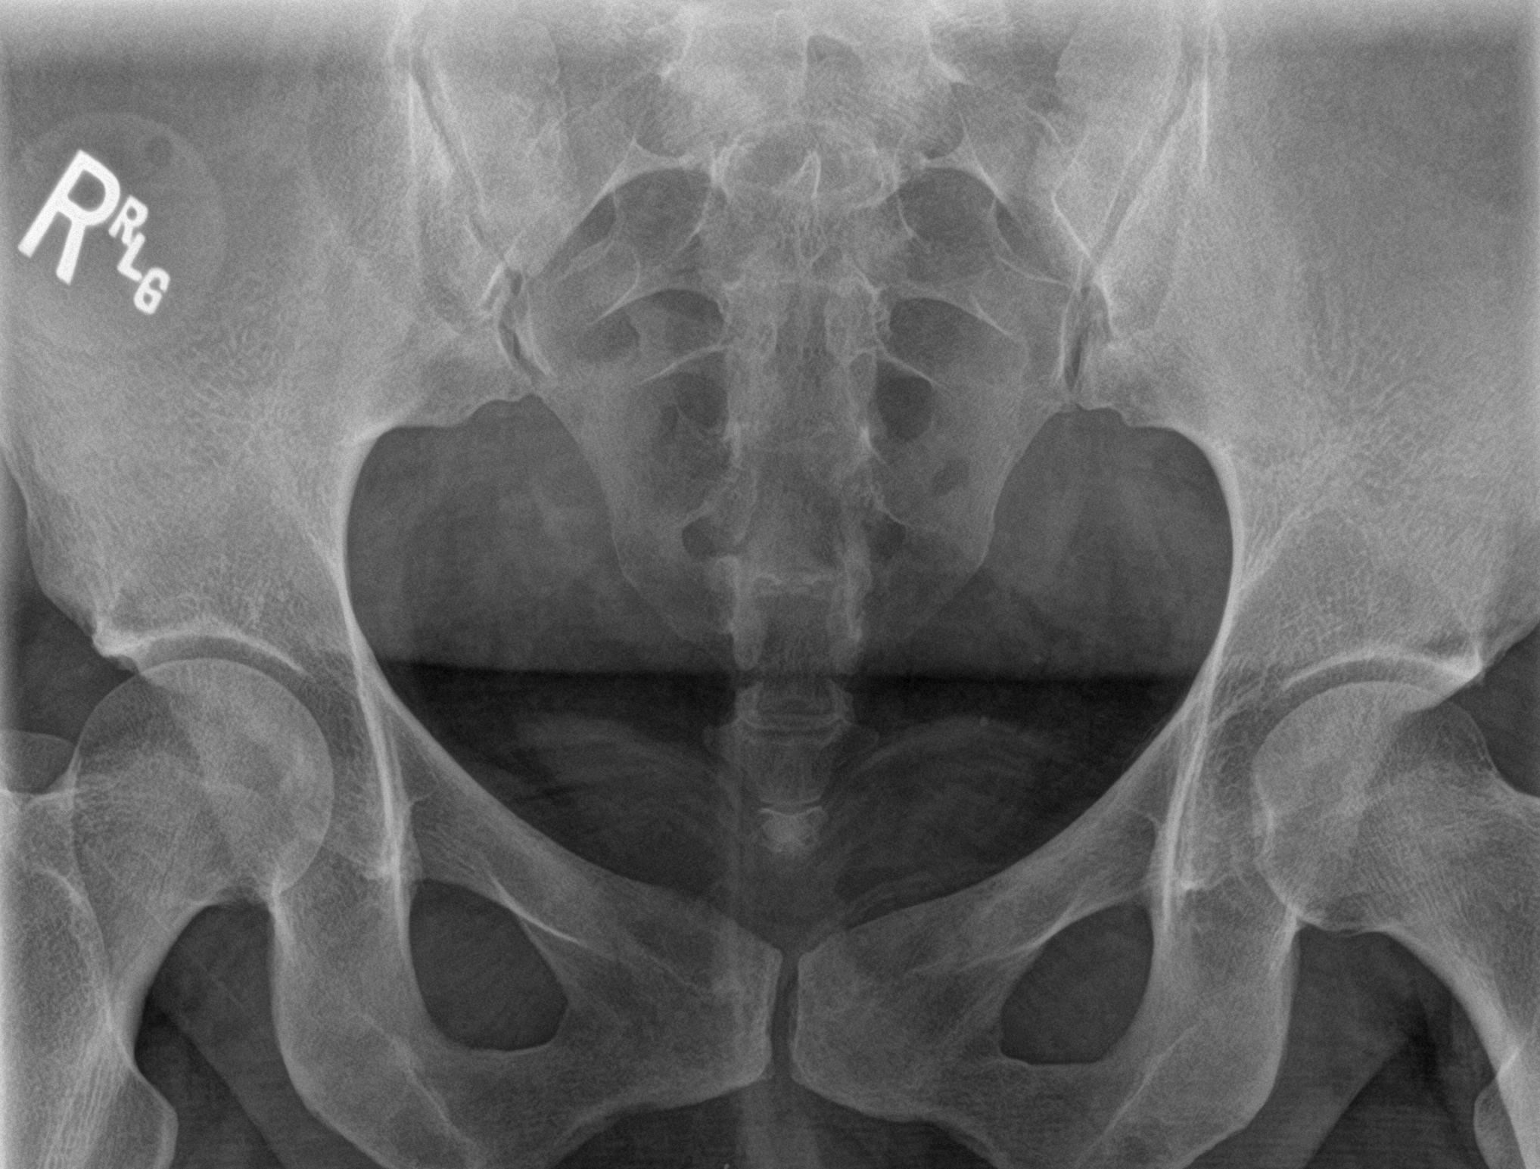

[2 of 2 positions shown; findings below may reference images not displayed]

FINDINGS: The bowel gas pattern is normal. Stable left renal calculus is
noted.
IMPRESSION: Left renal calculus is again noted.

## 2023-01-24 DIAGNOSIS — F330 Major depressive disorder, recurrent, mild: Secondary | ICD-10-CM | POA: Diagnosis not present

## 2023-01-29 IMAGING — CT CT RENAL STONE PROTOCOL
2 of 4 series · 17 of 46 positions shown, 19 images · non-contrast
Comparison: 09/17/2021

CLINICAL DATA: LEFT flank pain since early this morning with nausea
and vomiting, history of kidney stones with lithotripsy,
endometriosis



[Series 2: stone full · axial · 0.93mm/px · z∈[+839,+1284]mm · 14 of 99 slices shown, 16 images]
[im 5/99  soft-tissue]
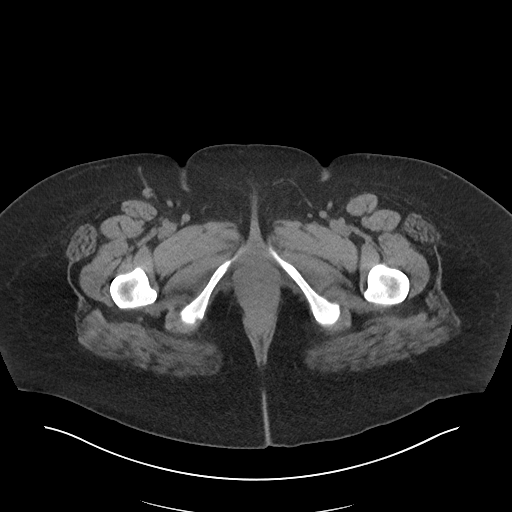
[im 5/99  bone]
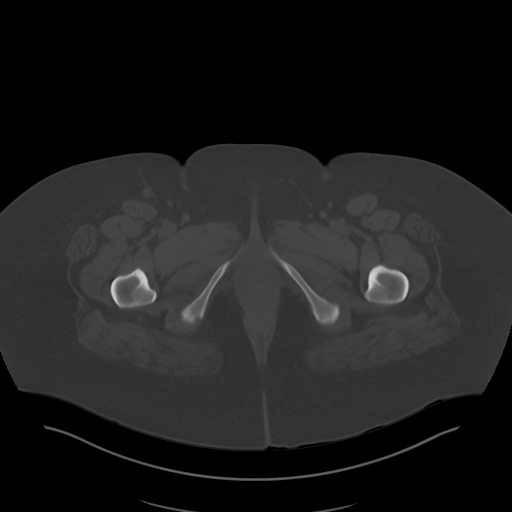
[im 13/99  soft-tissue]
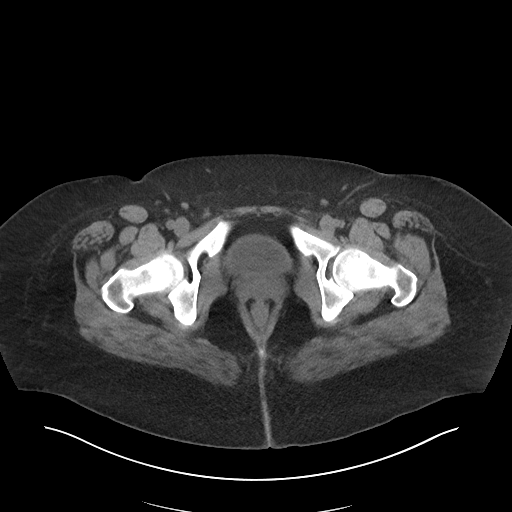
[im 21/99  soft-tissue]
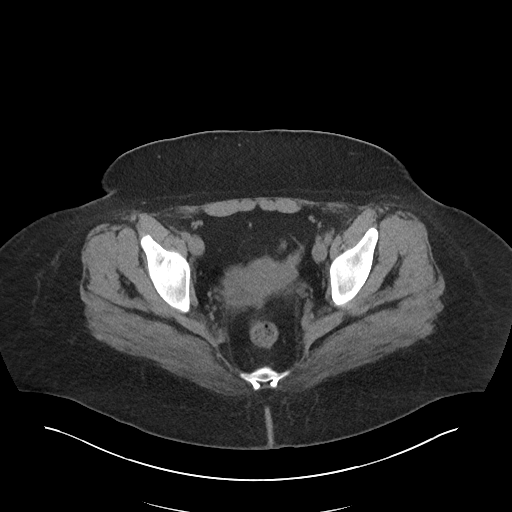
[im 25/99  soft-tissue]
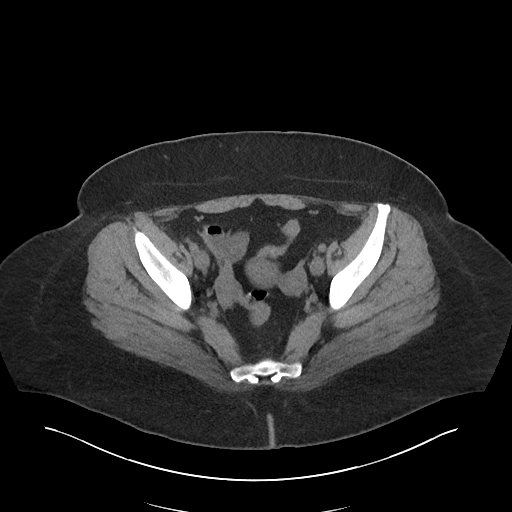
[im 33/99  soft-tissue]
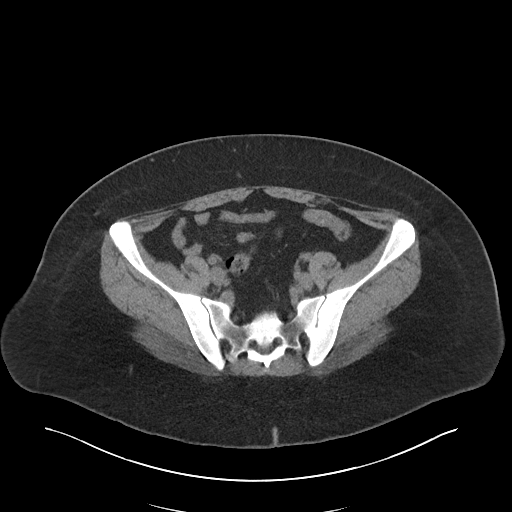
[im 41/99  soft-tissue]
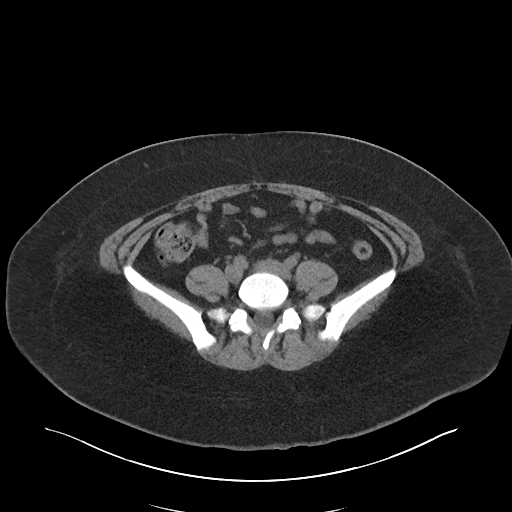
[im 45/99  soft-tissue]
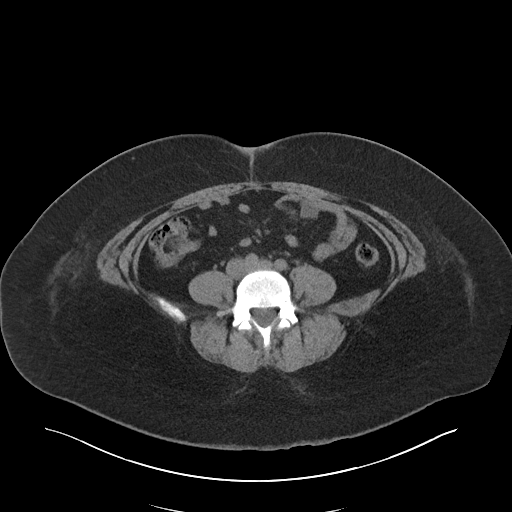
[im 54/99  soft-tissue]
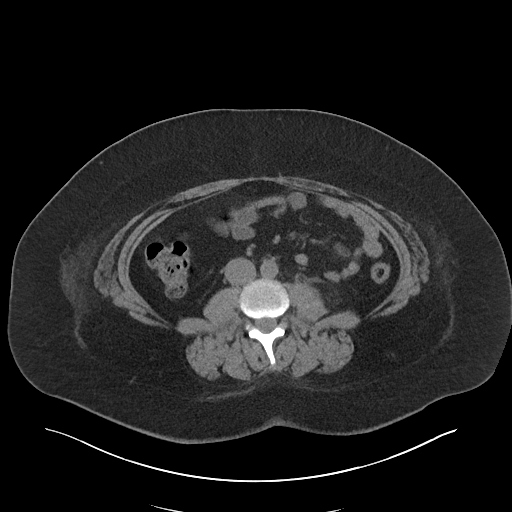
[im 58/99  soft-tissue]
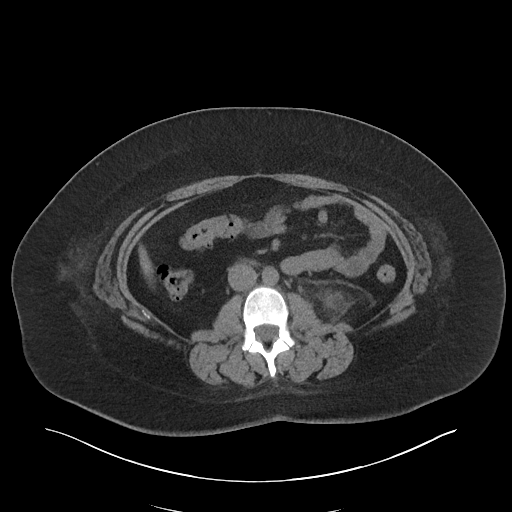
[im 58/99  bone]
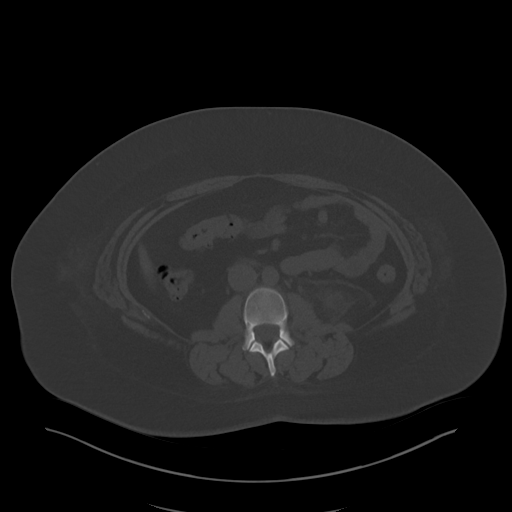
[im 66/99  soft-tissue]
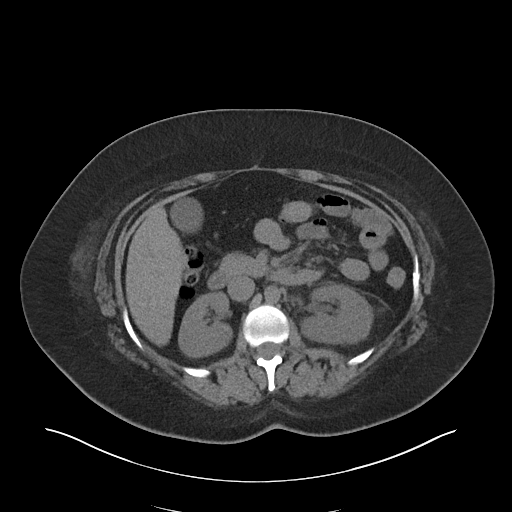
[im 74/99  soft-tissue]
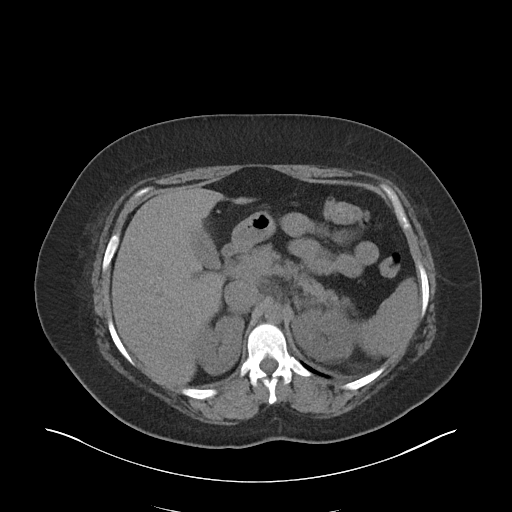
[im 78/99  soft-tissue]
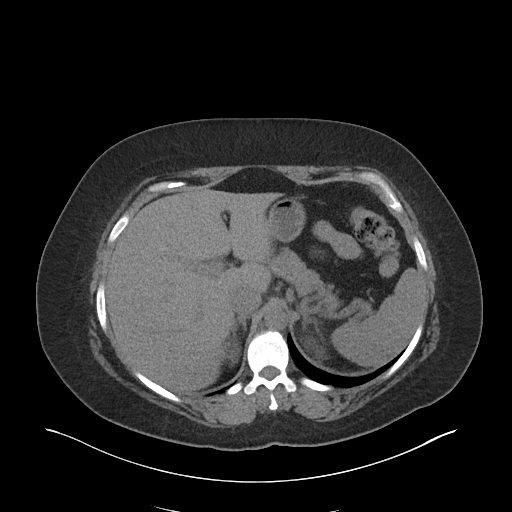
[im 86/99  soft-tissue]
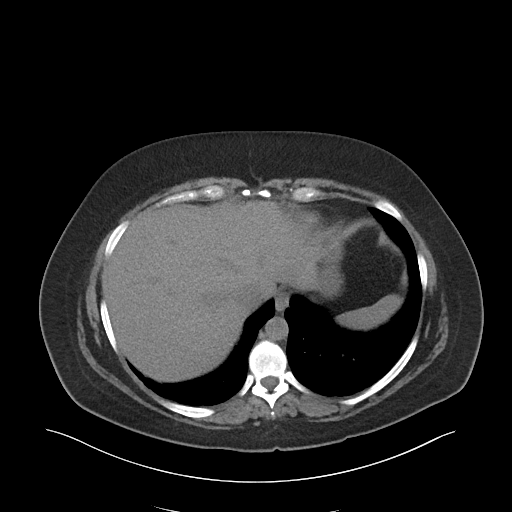
[im 94/99  soft-tissue]
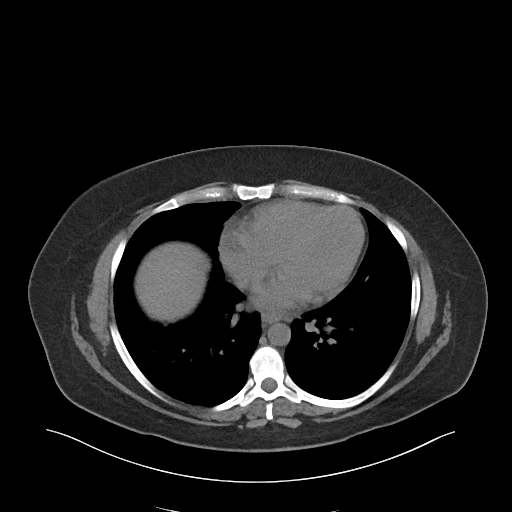

[Series 5: coronal · coronal · 0.96mm/px · 3 of 115 slices shown]
[im 39/115  soft-tissue]
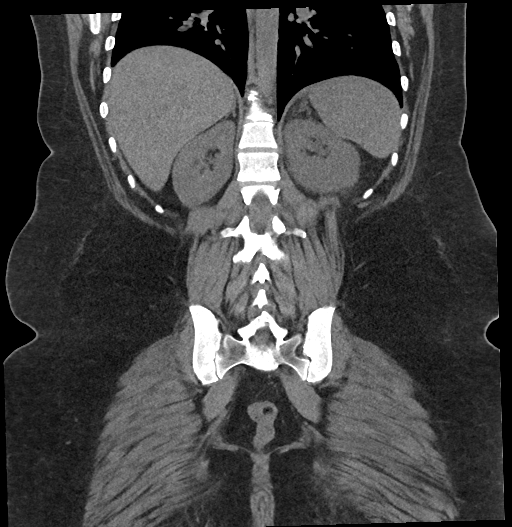
[im 51/115  soft-tissue]
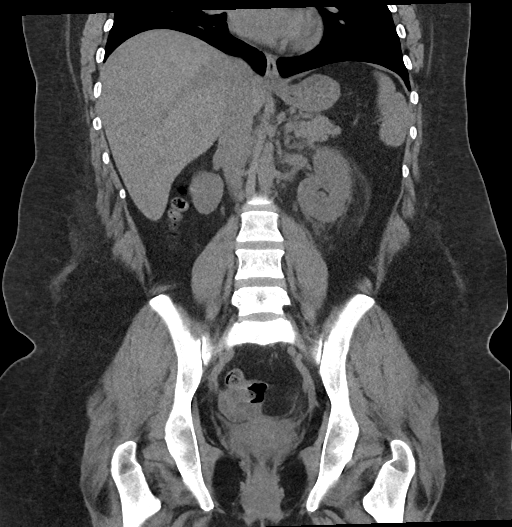
[im 64/115  soft-tissue]
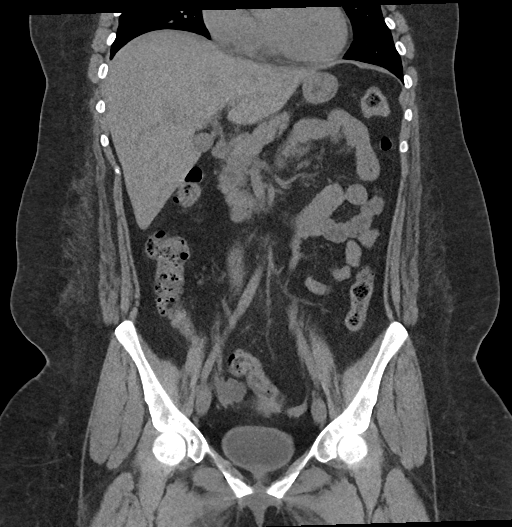

[17 of 46 positions shown; findings below may reference images not displayed]

FINDINGS: Lower chest: Minimal dependent atelectasis RIGHT lower lobe.

Hepatobiliary: Gallbladder and liver normal appearance

Pancreas: Normal appearance

Spleen: Normal appearance

Adrenals/Urinary Tract: Adrenal glands and RIGHT kidney normal
appearance. Mild LEFT renal enlargement with perinephric edema and
two nonshadowing calculi. Minimal LEFT hydronephrosis. No ureteral
calcification or dilatation definitely visualized. Bladder
unremarkable.

Stomach/Bowel: Normal appendix. Stomach and bowel loops normal
appearance.

Vascular/Lymphatic: Aorta normal caliber.  No adenopathy.

Reproductive: Normal appearing uterus and ovaries

Other: No free air or free fluid. No hernia or inflammatory process.

Musculoskeletal: Normal appearance
IMPRESSION: Mild LEFT renal enlargement with perinephric edema and two
nonshadowing calculi.

Minimal LEFT hydronephrosis without ureteral calcification or
dilatation.

Findings could be due to a recently passed calculus or
pyelonephritis, recommend correlation with urinalysis.

Remainder of exam unremarkable.

## 2023-02-07 DIAGNOSIS — F330 Major depressive disorder, recurrent, mild: Secondary | ICD-10-CM | POA: Diagnosis not present

## 2023-02-21 DIAGNOSIS — F330 Major depressive disorder, recurrent, mild: Secondary | ICD-10-CM | POA: Diagnosis not present

## 2023-03-07 ENCOUNTER — Ambulatory Visit: Payer: BC Managed Care – PPO | Admitting: Family Medicine

## 2023-03-07 DIAGNOSIS — F330 Major depressive disorder, recurrent, mild: Secondary | ICD-10-CM | POA: Diagnosis not present

## 2023-03-14 ENCOUNTER — Ambulatory Visit: Payer: BC Managed Care – PPO | Admitting: Family Medicine

## 2023-03-14 ENCOUNTER — Encounter: Payer: Self-pay | Admitting: Family Medicine

## 2023-03-14 VITALS — BP 114/82 | HR 70 | Temp 97.6°F | Ht 66.0 in | Wt 315.0 lb

## 2023-03-14 DIAGNOSIS — E7800 Pure hypercholesterolemia, unspecified: Secondary | ICD-10-CM

## 2023-03-14 DIAGNOSIS — F410 Panic disorder [episodic paroxysmal anxiety] without agoraphobia: Secondary | ICD-10-CM

## 2023-03-14 DIAGNOSIS — G4730 Sleep apnea, unspecified: Secondary | ICD-10-CM | POA: Diagnosis not present

## 2023-03-14 DIAGNOSIS — R635 Abnormal weight gain: Secondary | ICD-10-CM

## 2023-03-14 DIAGNOSIS — D649 Anemia, unspecified: Secondary | ICD-10-CM

## 2023-03-14 LAB — CBC WITH DIFFERENTIAL/PLATELET
Basophils Absolute: 0.1 10*3/uL (ref 0.0–0.1)
Basophils Relative: 1.2 % (ref 0.0–3.0)
Eosinophils Absolute: 0.1 10*3/uL (ref 0.0–0.7)
Eosinophils Relative: 1.6 % (ref 0.0–5.0)
HCT: 39.3 % (ref 36.0–46.0)
Hemoglobin: 12.7 g/dL (ref 12.0–15.0)
Lymphocytes Relative: 28.4 % (ref 12.0–46.0)
Lymphs Abs: 1.4 10*3/uL (ref 0.7–4.0)
MCHC: 32.3 g/dL (ref 30.0–36.0)
MCV: 90.4 fl (ref 78.0–100.0)
Monocytes Absolute: 0.3 10*3/uL (ref 0.1–1.0)
Monocytes Relative: 5 % (ref 3.0–12.0)
Neutro Abs: 3.2 10*3/uL (ref 1.4–7.7)
Neutrophils Relative %: 63.8 % (ref 43.0–77.0)
Platelets: 255 10*3/uL (ref 150.0–400.0)
RBC: 4.34 Mil/uL (ref 3.87–5.11)
RDW: 14.2 % (ref 11.5–15.5)
WBC: 5.1 10*3/uL (ref 4.0–10.5)

## 2023-03-14 LAB — LIPID PANEL
Cholesterol: 218 mg/dL — ABNORMAL HIGH (ref 0–200)
HDL: 52.5 mg/dL (ref >39.00)
LDL Cholesterol: 147 mg/dL — ABNORMAL HIGH (ref 0–99)
NonHDL: 165.23
Total CHOL/HDL Ratio: 4
Triglycerides: 93 mg/dL (ref 0.0–149.0)
VLDL: 18.6 mg/dL (ref 0.0–40.0)

## 2023-03-14 LAB — COMPREHENSIVE METABOLIC PANEL
ALT: 17 U/L (ref 0–35)
AST: 14 U/L (ref 0–37)
Albumin: 4 g/dL (ref 3.5–5.2)
Alkaline Phosphatase: 67 U/L (ref 39–117)
BUN: 11 mg/dL (ref 6–23)
CO2: 27 mEq/L (ref 19–32)
Calcium: 9.6 mg/dL (ref 8.4–10.5)
Chloride: 105 mEq/L (ref 96–112)
Creatinine, Ser: 0.8 mg/dL (ref 0.40–1.20)
GFR: 94.95 mL/min (ref >60.00)
Glucose, Bld: 83 mg/dL (ref 70–99)
Potassium: 3.8 mEq/L (ref 3.5–5.1)
Sodium: 140 mEq/L (ref 135–145)
Total Bilirubin: 0.4 mg/dL (ref 0.2–1.2)
Total Protein: 6.9 g/dL (ref 6.0–8.3)

## 2023-03-14 LAB — TSH: TSH: 1.57 u[IU]/mL (ref 0.35–5.50)

## 2023-03-14 MED ORDER — ALPRAZOLAM 0.25 MG PO TABS: 0.2500 mg | ORAL_TABLET | Freq: Two times a day (BID) | ORAL | 0 refills | Status: DC | PRN

## 2023-03-14 NOTE — Telephone Encounter (Signed)
Fyi.. will need to [facility] to [facility]

## 2023-03-14 NOTE — Progress Notes (Unsigned)
Subjective:     Patient ID: Tara Singleton, female    DOB: 09-14-1985, 37 y.o.   MRN: [id number]  Chief Complaint  Patient presents with   Medical Management of Chronic Issues    6 month f/u, a lot going on in her life and has been having more panic attacks, would like to discuss something to help w that    HPI  Discussed the use of AI scribe software for clinical note transcription with the patient, who gave verbal consent to proceed.  History of Present Illness         Other providers:  Neurology- [facility] Dr Cathie Hoops Psych- Dr. Gaspar Skeeters  Therapist- [facility]  OB/GYN- [facility]    On Abilify for depression. Also taking Prozac 80 mg.   C/o panic attacks in the past few months. Worsening anxiety.  Her mother in law is living with her and her wife and has been diagnosed with cancer.   Stopped Topiramate due to renal stones per neurologist.  Aimovig monthly migraine prevention.    Verapamil 120 mg H/A prevention, Maxalt 10 mg prn and add motrin or baclofen and zofran  Prozac  Migraines   Sleep study showed mild OSA and she declined CPAP  Vapes  HLD- she has cut back on foods high in fat.   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   Has IUD  Denies hx of pancreatitis or thyroid cancer.  Interested in weight loss medication, injections.    Health Maintenance Due  Topic Date Due   HIV Screening  Never done   Hepatitis C Screening  Never done   PAP SMEAR-Modifier  Never done    Past Medical History:  Diagnosis Date   Anxiety    Asthma    Depression    Endometriosis    GERD (gastroesophageal reflux disease)    Headache     Past Surgical History:  Procedure Laterality Date   EXTRACORPOREAL SHOCK WAVE LITHOTRIPSY Left 01/28/2022   Procedure: EXTRACORPOREAL SHOCK WAVE LITHOTRIPSY (ESWL);  Surgeon: Heloise Purpura, MD;  Location: [facility];  Service: Urology;  Laterality: Left;    Family History  Problem Relation Age of Onset    Asthma Father     Social History   Socioeconomic History   Marital status: Married    Spouse name: Not on file   Number of children: Not on file   Years of education: Not on file   Highest education level: Some college, no degree  Occupational History   Not on file  Tobacco Use   Smoking status: Never   Smokeless tobacco: Never  Substance and Sexual Activity   Alcohol use: Never   Drug use: Never   Sexual activity: Not on file  Other Topics Concern   Not on file  Social History Narrative   Not on file   Social Determinants of Health   Financial Resource Strain: Medium Risk (03/13/2023)   Overall Financial Resource Strain (CARDIA)    Difficulty of Paying Living Expenses: Somewhat hard  Food Insecurity: No Food Insecurity (03/13/2023)   Hunger Vital Sign    Worried About Running Out of Food in the Last Year: Never true    Ran Out of Food in the Last Year: Never true  Transportation Needs: No Transportation Needs (03/13/2023)   PRAPARE - Administrator, Civil Service (Medical): No    Lack of Transportation (Non-Medical): No  Physical Activity: Insufficiently Active (03/13/2023)   Exercise Vital  Sign    Days of Exercise per Week: 2 days    Minutes of Exercise per Session: 10 min  Stress: Stress Concern Present (03/13/2023)   [employer] of Occupational Health - Occupational Stress Questionnaire    Feeling of Stress : To some extent  Social Connections: Moderately Isolated (03/13/2023)   Social Connection and Isolation Panel [NHANES]    Frequency of Communication with Friends and Family: More than three times a week    Frequency of Social Gatherings with Friends and Family: Never    Attends Religious Services: Never    Database administrator or Organizations: No    Attends Engineer, structural: Not on file    Marital Status: Married  Catering manager Violence: Not on file    Outpatient Medications Prior to Visit  Medication Sig Dispense Refill    ABILIFY 5 MG tablet      cetirizine (ZYRTEC) 10 MG tablet Take by mouth.     cholecalciferol (VITAMIN D3) 25 MCG (1000 UNIT) tablet Take 1,000 Units by mouth daily.     FLUoxetine (PROZAC) 40 MG capsule Take 40 mg by mouth daily.     gabapentin (NEURONTIN) 100 MG capsule Take by mouth. Take 2 tablets 3 times daily     hydrOXYzine (ATARAX) 25 MG tablet Take by mouth.     mometasone (NASONEX) 50 MCG/ACT nasal spray Place 2 sprays into the nose daily.     ondansetron (ZOFRAN-ODT) 4 MG disintegrating tablet Take 4 mg by mouth every 12 (twelve) hours.     PROAIR HFA 108 (90 Base) MCG/ACT inhaler Inhale into the lungs.     rizatriptan (MAXALT) 10 MG tablet Take by mouth.     verapamil (CALAN-SR) 120 MG CR tablet Take 120 mg by mouth at bedtime.     ABILIFY 5 MG tablet  (Patient not taking: Reported on 09/04/2022)     baclofen (LIORESAL) 10 MG tablet Take 10 mg by mouth. As needed     Erenumab-aooe (AIMOVIG [place]) Inject into the skin. Inject into skin once every month for migraines.     No facility-administered medications prior to visit.    Allergies  Allergen Reactions   Avocado Hives, Nausea And Vomiting, Rash, Shortness Of Breath and Swelling   Betula Alba Oil Anaphylaxis   Kiwi Extract Dermatitis, Hives, Rash and Shortness Of Breath   Shellfish Allergy Anaphylaxis   Sulfa Antibiotics Hives, Other (See Comments) and Rash    Upset stomach    White Birch Anaphylaxis    Birch trees   Latex Hives   Sulfur Other (See Comments)    Welts   Avocado Extract Allergy Skin Test Hives   Macrobid [Nitrofurantoin] Hives   Mango Flavor Hives    Review of Systems  Constitutional:  Negative for chills, fever, malaise/fatigue and weight loss.  Respiratory:  Negative for shortness of breath.   Cardiovascular:  Positive for leg swelling. Negative for chest pain and palpitations.       Intermittent  Gastrointestinal:  Negative for abdominal pain, constipation, diarrhea, nausea and vomiting.   Genitourinary:  Negative for dysuria, frequency and urgency.  Neurological:  Negative for dizziness, sensory change and weakness.  Psychiatric/Behavioral:  Positive for depression. Negative for substance abuse and suicidal ideas. The patient is nervous/anxious.        Objective:    Physical Exam Constitutional:      General: She is not in acute distress.    Appearance: She is obese. She is not ill-appearing.  Eyes:     Extraocular Movements: Extraocular movements intact.     Conjunctiva/sclera: Conjunctivae normal.  Cardiovascular:     Rate and Rhythm: Normal rate.  Pulmonary:     Effort: Pulmonary effort is normal.  Musculoskeletal:     Cervical back: Normal range of motion and neck supple.  Skin:    General: Skin is warm and dry.  Neurological:     General: No focal deficit present.     Mental Status: She is alert and oriented to person, place, and time.  Psychiatric:        Attention and Perception: Attention normal.        Mood and Affect: Mood normal. Affect is tearful.        Speech: Speech normal.        Behavior: Behavior normal.        Thought Content: Thought content normal. Thought content does not include homicidal or suicidal ideation.      BP 114/82 (BP Location: Left Arm, Patient Position: Sitting, Cuff Size: Large)   Pulse 70   Temp 97.6 F (36.4 C) (Temporal)   Ht 5\' 6"  (1.676 m)   Wt (!) 315 lb (142.9 kg)   SpO2 98%   BMI 50.84 kg/m  Wt Readings from Last 3 Encounters:  03/14/23 (!) 315 lb (142.9 kg)  10/14/22 (!) 302 lb (137 kg)  09/04/22 (!) 303 lb (137.4 kg)       Assessment & Plan:   Problem List Items Addressed This Visit       Respiratory   Mild sleep apnea    Prefers to hold off on CPAP. Recommend weight loss. Avoid sedating medications.         Other   Anemia    Check labs      Relevant Orders   CBC with Differential/Platelet (Completed)   Iron, TIBC and Ferritin Panel (Completed)   TSH (Completed)   Hyperlipidemia     Continue healthy diet changes. GLP-1 prescribed. Weight loss will help. Referred to [facility]      Relevant Orders   Lipid panel (Completed)   Panic attacks - Primary    Alprazolam prescribed. Continue follow up with psychiatrist. Continue counseling. Advised alprazolam for short term use only. Habit forming.       Relevant Medications   ALPRAZolam (XANAX) 0.25 MG tablet   Other Relevant Orders   TSH (Completed)   Comprehensive metabolic panel (Completed)   Severe obesity (BMI >= 40) (HCC)    GLP-1 discussed. She is interested. Counseling on potential side effects and how to manage on the medication. Referral to [facility]. Check labs to look for complications.       Relevant Medications   tirzepatide (ZEPBOUND) 2.5 MG/0.5ML Pen   Other Relevant Orders   Amb Ref to Medical Weight Management   Lipid panel (Completed)   Weight gain   Relevant Orders   CBC with Differential/Platelet (Completed)   TSH (Completed)   Comprehensive metabolic panel (Completed)    I have discontinued Tyreshia Drenning "Vinny"'s baclofen, Erenumab-aooe (AIMOVIG [place]), and ALPRAZolam. I am also having her start on ALPRAZolam and Zepbound. Additionally, I am having her maintain her ProAir HFA, cetirizine, hydrOXYzine, verapamil, rizatriptan, cholecalciferol, FLUoxetine, ondansetron, mometasone, gabapentin, and Abilify.  Meds ordered this encounter  Medications   DISCONTD: ALPRAZolam (XANAX) 0.25 MG tablet    Sig: Take 1 tablet (0.25 mg total) by mouth 2 (two) times daily as needed for anxiety.    Dispense:  20 tablet  Refill:  0    Order Specific Question:   Supervising Provider    Answer:   Hillard Danker A [[id number]]   ALPRAZolam (XANAX) 0.25 MG tablet    Sig: Take 1 tablet (0.25 mg total) by mouth 2 (two) times daily as needed for anxiety.    Dispense:  20 tablet    Refill:  0    Order Specific Question:   Supervising Provider    Answer:   Hillard Danker A [[id number]]   tirzepatide (ZEPBOUND) 2.5 MG/0.5ML  Pen    Sig: Inject 2.5 mg into the skin once a week.    Dispense:  2 mL    Refill:  2    Order Specific Question:   Supervising Provider    Answer:   Hillard Danker A [[id number]]

## 2023-03-14 NOTE — Patient Instructions (Addendum)
Please go downstairs for labs.   You should hear from [facility]. Let me know if you do not.   Check with your insurance regarding weight loss medications such as Wegovy or Zepbound and let me know.   Use the alprazolam sparingly   Follow up in 3 months or sooner if needed.

## 2023-03-15 LAB — IRON,TIBC AND FERRITIN PANEL
%SAT: 29 % (calc) (ref 16–45)
Ferritin: 18 ng/mL (ref 16–154)
Iron: 107 ug/dL (ref 40–190)
TIBC: 370 mcg/dL (calc) (ref 250–450)

## 2023-03-16 DIAGNOSIS — F410 Panic disorder [episodic paroxysmal anxiety] without agoraphobia: Secondary | ICD-10-CM | POA: Insufficient documentation

## 2023-03-16 DIAGNOSIS — G4730 Sleep apnea, unspecified: Secondary | ICD-10-CM | POA: Insufficient documentation

## 2023-03-16 DIAGNOSIS — D649 Anemia, unspecified: Secondary | ICD-10-CM | POA: Insufficient documentation

## 2023-03-16 MED ORDER — ZEPBOUND 2.5 MG/0.5ML ~~LOC~~ SOAJ
2.5000 mg | SUBCUTANEOUS | 2 refills | Status: DC
Start: 2023-03-16 — End: 2023-03-20

## 2023-03-16 NOTE — Assessment & Plan Note (Signed)
Check labs 

## 2023-03-16 NOTE — Assessment & Plan Note (Signed)
Continue healthy diet changes. GLP-1 prescribed. Weight loss will help. Referred to [facility]

## 2023-03-16 NOTE — Assessment & Plan Note (Addendum)
GLP-1 discussed. She is interested. Counseling on potential side effects and how to manage on the medication. Referral to [facility]. Check labs to look for complications.

## 2023-03-16 NOTE — Assessment & Plan Note (Signed)
Prefers to hold off on CPAP. Recommend weight loss. Avoid sedating medications.

## 2023-03-16 NOTE — Assessment & Plan Note (Signed)
Alprazolam prescribed. Continue follow up with psychiatrist. Continue counseling. Advised alprazolam for short term use only. Habit forming.

## 2023-03-17 NOTE — Telephone Encounter (Signed)
Please initiate Zepbound PA

## 2023-03-18 ENCOUNTER — Other Ambulatory Visit (HOSPITAL_COMMUNITY): Payer: Self-pay

## 2023-03-18 ENCOUNTER — Telehealth: Payer: Self-pay

## 2023-03-18 NOTE — Telephone Encounter (Signed)
Pharmacy Patient Advocate Encounter   Received notification that prior authorization for Zepbound is required/requested.   PA submitted to  [facility]   via CoverMyMeds Key  # [id number] Status is pending

## 2023-03-19 ENCOUNTER — Other Ambulatory Visit (HOSPITAL_COMMUNITY): Payer: Self-pay

## 2023-03-19 NOTE — Telephone Encounter (Signed)
Approval letter attached to charts   

## 2023-03-19 NOTE — Telephone Encounter (Signed)
Patient Advocate Encounter  Prior Authorization for Zepbound has been approved with [employer].    PA# [id number] Effective dates: 03/18/23 through 10/17/23  Per [facility] test claim, copay for 28 days supply is $24.99

## 2023-03-20 MED ORDER — ZEPBOUND 2.5 MG/0.5ML ~~LOC~~ SOAJ
2.5000 mg | SUBCUTANEOUS | 0 refills | Status: DC
Start: 2023-03-20 — End: 2023-06-13

## 2023-03-20 NOTE — Addendum Note (Signed)
Addended by: Marinus Maw on: 03/20/2023 11:33 AM   Modules accepted: Orders

## 2023-03-20 NOTE — Telephone Encounter (Signed)
Are you ok with 90 day supply? 

## 2023-03-20 NOTE — Telephone Encounter (Signed)
Sent pt [place] informing of approval

## 2023-03-21 DIAGNOSIS — F330 Major depressive disorder, recurrent, mild: Secondary | ICD-10-CM | POA: Diagnosis not present

## 2023-04-04 DIAGNOSIS — Z30431 Encounter for routine checking of intrauterine contraceptive device: Secondary | ICD-10-CM | POA: Diagnosis not present

## 2023-04-04 DIAGNOSIS — N946 Dysmenorrhea, unspecified: Secondary | ICD-10-CM | POA: Diagnosis not present

## 2023-04-18 DIAGNOSIS — F330 Major depressive disorder, recurrent, mild: Secondary | ICD-10-CM | POA: Diagnosis not present

## 2023-04-25 DIAGNOSIS — F419 Anxiety disorder, unspecified: Secondary | ICD-10-CM | POA: Diagnosis not present

## 2023-04-25 DIAGNOSIS — F32A Depression, unspecified: Secondary | ICD-10-CM | POA: Diagnosis not present

## 2023-04-25 DIAGNOSIS — I10 Essential (primary) hypertension: Secondary | ICD-10-CM | POA: Diagnosis not present

## 2023-04-25 DIAGNOSIS — G43109 Migraine with aura, not intractable, without status migrainosus: Secondary | ICD-10-CM | POA: Diagnosis not present

## 2023-04-25 DIAGNOSIS — R202 Paresthesia of skin: Secondary | ICD-10-CM | POA: Diagnosis not present

## 2023-05-01 DIAGNOSIS — F330 Major depressive disorder, recurrent, mild: Secondary | ICD-10-CM | POA: Diagnosis not present

## 2023-05-16 DIAGNOSIS — F330 Major depressive disorder, recurrent, mild: Secondary | ICD-10-CM | POA: Diagnosis not present

## 2023-05-30 DIAGNOSIS — F330 Major depressive disorder, recurrent, mild: Secondary | ICD-10-CM | POA: Diagnosis not present

## 2023-06-13 ENCOUNTER — Ambulatory Visit: Payer: BC Managed Care – PPO | Admitting: Family Medicine

## 2023-06-13 ENCOUNTER — Encounter: Payer: Self-pay | Admitting: Family Medicine

## 2023-06-13 DIAGNOSIS — G4730 Sleep apnea, unspecified: Secondary | ICD-10-CM | POA: Diagnosis not present

## 2023-06-13 DIAGNOSIS — J4540 Moderate persistent asthma, uncomplicated: Secondary | ICD-10-CM

## 2023-06-13 DIAGNOSIS — I10 Essential (primary) hypertension: Secondary | ICD-10-CM

## 2023-06-13 DIAGNOSIS — E7800 Pure hypercholesterolemia, unspecified: Secondary | ICD-10-CM

## 2023-06-13 MED ORDER — ZEPBOUND 5 MG/0.5ML ~~LOC~~ SOAJ: 5.0000 mg | SUBCUTANEOUS | 2 refills | Status: DC

## 2023-06-13 MED ORDER — ZEPBOUND 5 MG/0.5ML ~~LOC~~ SOAJ: 5.0000 mg | SUBCUTANEOUS | 0 refills | Status: DC

## 2023-06-13 NOTE — Assessment & Plan Note (Signed)
Continue healthy diet changes. Continue Zepboud. Weight loss will help. Referred to [facility]

## 2023-06-13 NOTE — Assessment & Plan Note (Signed)
Prefers to hold off on CPAP. Weight loss will be beneficial. Avoid sedating medications.

## 2023-06-13 NOTE — Progress Notes (Signed)
Subjective:     Patient ID: Tara Singleton, female    DOB: [birth year], 37 y.o.   MRN: [id number]  Chief Complaint  Patient presents with   Medical Management of Chronic Issues    3 month f/u    HPI  Discussed the use of AI scribe software for clinical note transcription with the patient, who gave verbal consent to proceed.  History of Present Illness         Other providers:  Neurology- [facility] Dr Cathie Hoops Psych- Dr. Gaspar Skeeters  Therapist- [facility]  OB/GYN- [facility]  Orthopedist   Scheduled to see headache clinic, Dr. Neale Burly, next week.   Here to follow up on chronic health conditions.   Obesity- referred to [facility] and started Zepbound in July 2024. She has not yet scheduled with [facility]. Doing well on Zepbound. Lost 9 lbs. Would like to increase dose.   On Abilify for depression. Also taking Prozac 80 mg.    C/o panic attacks in the past few months. Worsening anxiety.    Stopped Topiramate due to renal stones per neurologist.  Aimovig monthly migraine prevention.    Verapamil 120 mg H/A prevention, Maxalt 10 mg prn and add motrin or baclofen and zofran  Prozac  Migraines     Sleep study showed mild OSA and she declined CPAP     HLD- she has cut back on foods high in fat.    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  Health Maintenance Due  Topic Date Due   HIV Screening  Never done   Hepatitis C Screening  Never done    Past Medical History:  Diagnosis Date   Anxiety    Asthma    Depression    Endometriosis    GERD (gastroesophageal reflux disease)    Headache     Past Surgical History:  Procedure Laterality Date   EXTRACORPOREAL SHOCK WAVE LITHOTRIPSY Left 01/28/2022   Procedure: EXTRACORPOREAL SHOCK WAVE LITHOTRIPSY (ESWL);  Surgeon: Heloise Purpura, MD;  [facility];  Service: Urology;  Laterality: Left;    Family History  Problem Relation Age of Onset   Asthma Father     Social History    Socioeconomic History   Marital status: Married    Spouse name: Not on file   Number of children: Not on file   Years of education: Not on file   Highest education level: Some college, no degree  Occupational History   Not on file  Tobacco Use   Smoking status: Never   Smokeless tobacco: Never  Substance and Sexual Activity   Alcohol use: Never   Drug use: Never   Sexual activity: Not on file  Other Topics Concern   Not on file  Social History Narrative   Not on file   Social Determinants of Health   Financial Resource Strain: Medium Risk (03/13/2023)   Overall Financial Resource Strain (CARDIA)    Difficulty of Paying Living Expenses: Somewhat hard  Food Insecurity: No Food Insecurity (03/13/2023)   Hunger Vital Sign    Worried About Running Out of Food in the Last Year: Never true    Ran Out of Food in the Last Year: Never true  Transportation Needs: No Transportation Needs (03/13/2023)   PRAPARE - Administrator, Civil Service (Medical): No    Lack of Transportation (Non-Medical): No  Physical Activity: Insufficiently Active (03/13/2023)   Exercise Vital Sign    Days of Exercise  per Week: 2 days    Minutes of Exercise per Session: 10 min  Stress: Stress Concern Present (03/13/2023)   [employer] of Occupational Health - Occupational Stress Questionnaire    Feeling of Stress : To some extent  Social Connections: Moderately Isolated (03/13/2023)   Social Connection and Isolation Panel [NHANES]    Frequency of Communication with Friends and Family: More than three times a week    Frequency of Social Gatherings with Friends and Family: Never    Attends Religious Services: Never    Database administrator or Organizations: No    Attends Engineer, structural: Not on file    Marital Status: Married  Catering manager Violence: Not At Risk (10/31/2022)   Received from [facility]   HITS    Over the last 12 months how often did your partner  physically hurt you?: 1    Over the last 12 months how often did your partner insult you or talk down to you?: 1    Over the last 12 months how often did your partner threaten you with physical harm?: 1    Over the last 12 months how often did your partner scream or curse at you?: 1    Outpatient Medications Prior to Visit  Medication Sig Dispense Refill   ABILIFY 5 MG tablet      ALPRAZolam (XANAX) 0.25 MG tablet Take 1 tablet (0.25 mg total) by mouth 2 (two) times daily as needed for anxiety. 20 tablet 0   cetirizine (ZYRTEC) 10 MG tablet Take by mouth.     cholecalciferol (VITAMIN D3) 25 MCG (1000 UNIT) tablet Take 1,000 Units by mouth daily.     FLUoxetine (PROZAC) 40 MG capsule Take 40 mg by mouth daily.     gabapentin (NEURONTIN) 100 MG capsule Take by mouth. Take 2 tablets 3 times daily     hydrOXYzine (ATARAX) 25 MG tablet Take by mouth.     mometasone (NASONEX) 50 MCG/ACT nasal spray Place 2 sprays into the nose daily.     ondansetron (ZOFRAN-ODT) 4 MG disintegrating tablet Take 4 mg by mouth every 12 (twelve) hours.     PROAIR HFA 108 (90 Base) MCG/ACT inhaler Inhale into the lungs.     rizatriptan (MAXALT) 10 MG tablet Take by mouth.     verapamil (CALAN-SR) 120 MG CR tablet Take 120 mg by mouth at bedtime.     tirzepatide (ZEPBOUND) 2.5 MG/0.5ML Pen Inject 2.5 mg into the skin once a week. 6 mL 0   No facility-administered medications prior to visit.    Allergies  Allergen Reactions   Avocado Hives, Nausea And Vomiting, Rash, Shortness Of Breath and Swelling   Betula Alba Oil Anaphylaxis   Kiwi Extract Dermatitis, Hives, Rash and Shortness Of Breath   Shellfish Allergy Anaphylaxis   Sulfa Antibiotics Hives, Other (See Comments) and Rash    Upset stomach    White Birch Anaphylaxis    Birch trees   Latex Hives   Sulfur Other (See Comments)    Welts   Avocado Extract Allergy Skin Test Hives   Macrobid [Nitrofurantoin] Hives   Mango Flavor Hives    Review of  Systems  Constitutional:  Positive for weight loss. Negative for chills and fever.       Intentional  Respiratory:  Negative for shortness of breath.   Cardiovascular:  Negative for chest pain, palpitations and leg swelling.  Gastrointestinal:  Negative for abdominal pain, constipation, diarrhea, nausea and  vomiting.  Genitourinary:  Negative for dysuria, frequency and urgency.  Neurological:  Negative for dizziness and focal weakness.       Objective:    Physical Exam Constitutional:      General: She is not in acute distress.    Appearance: She is not ill-appearing.  Eyes:     Extraocular Movements: Extraocular movements intact.     Conjunctiva/sclera: Conjunctivae normal.  Cardiovascular:     Rate and Rhythm: Normal rate.  Pulmonary:     Effort: Pulmonary effort is normal.  Musculoskeletal:     Cervical back: Normal range of motion and neck supple.  Skin:    General: Skin is warm and dry.  Neurological:     General: No focal deficit present.     Mental Status: She is alert and oriented to person, place, and time.  Psychiatric:        Mood and Affect: Mood normal.        Behavior: Behavior normal.        Thought Content: Thought content normal.      BP 126/82 (BP Location: Left Arm, Patient Position: Sitting, Cuff Size: Large)   Pulse 70   Temp 97.8 F (36.6 C) (Temporal)   Ht 5\' 6"  (1.676 m)   Wt (!) 306 lb (138.8 kg)   SpO2 99%   BMI 49.39 kg/m  Wt Readings from Last 3 Encounters:  06/13/23 (!) 306 lb (138.8 kg)  03/14/23 (!) 315 lb (142.9 kg)  10/14/22 (!) 302 lb (137 kg)       Assessment & Plan:   Problem List Items Addressed This Visit       Cardiovascular and Mediastinum   Essential hypertension    Controlled. On Verapamil. Last renal function normal        Respiratory   Mild sleep apnea    Prefers to hold off on CPAP. Weight loss will be beneficial. Avoid sedating medications.       Moderate persistent asthma without complication     Controlled. Continue current medications        Other   Hyperlipidemia    Continue healthy diet changes. Continue Zepboud. Weight loss will help. Referred to [facility]      Severe obesity (BMI >= 40) (HCC) - Primary    Doing well on Zepbound. Increase dose and follow up in 3-6 months. Referred to [facility]. Reviewed labs with patient from July.       Relevant Medications   tirzepatide (ZEPBOUND) 5 MG/0.5ML Pen    I have discontinued Sarha Tinkle "Vinny"'s Zepbound. I am also having her start on Zepbound. Additionally, I am having her maintain her ProAir HFA, cetirizine, hydrOXYzine, verapamil, rizatriptan, cholecalciferol, FLUoxetine, ondansetron, mometasone, gabapentin, Abilify, and ALPRAZolam.  Meds ordered this encounter  Medications   tirzepatide (ZEPBOUND) 5 MG/0.5ML Pen    Sig: Inject 5 mg into the skin once a week.    Dispense:  2 mL    Refill:  2    Order Specific Question:   Supervising Provider    Answer:   Hillard Danker A [[id number]]

## 2023-06-13 NOTE — Patient Instructions (Signed)
I sent in Zepbound 5 mg weekly. Let me know in 3 months how you are doing please.

## 2023-06-13 NOTE — Assessment & Plan Note (Signed)
Controlled. On Verapamil. Last renal function normal

## 2023-06-13 NOTE — Addendum Note (Signed)
Addended by: Marinus Maw on: 06/13/2023 09:25 AM   Modules accepted: Orders

## 2023-06-13 NOTE — Assessment & Plan Note (Signed)
Doing well on Zepbound. Increase dose and follow up in 3-6 months. Referred to [facility]. Reviewed labs with patient from July.

## 2023-06-13 NOTE — Assessment & Plan Note (Signed)
Controlled.  Continue current medications.

## 2023-06-27 DIAGNOSIS — F330 Major depressive disorder, recurrent, mild: Secondary | ICD-10-CM | POA: Diagnosis not present

## 2023-07-04 DIAGNOSIS — F330 Major depressive disorder, recurrent, mild: Secondary | ICD-10-CM | POA: Diagnosis not present

## 2023-07-05 ENCOUNTER — Ambulatory Visit
Admission: RE | Admit: 2023-07-05 | Discharge: 2023-07-05 | Disposition: A | Discharge status: Home / Self Care | Payer: BC Managed Care – PPO | Source: Ambulatory Visit | Attending: Internal Medicine

## 2023-07-05 VITALS — BP 125/80 | HR 76 | Temp 97.6°F | Resp 18

## 2023-07-05 DIAGNOSIS — M7752 Other enthesopathy of left foot: Secondary | ICD-10-CM | POA: Diagnosis not present

## 2023-07-05 MED ORDER — NAPROXEN 500 MG PO TABS: 500.0000 mg | ORAL_TABLET | Freq: Two times a day (BID) | ORAL | 0 refills | Status: DC

## 2023-07-05 NOTE — ED Provider Notes (Signed)
[facility]    CSN: [id number] Arrival date & time: 07/05/23  1303      History   Chief Complaint Chief Complaint  Patient presents with   Foot Pain    Entered by patient    HPI Tara Singleton is a 37 y.o. female.   Patient presents to urgent care for evaluation of pain to the left generalized ankle that started yesterday while she was in a resting position.  She moved her foot a certain way while she was in her car and felt a "catch" to her left lateral ankle.  She describes this as a pulling and tight sensation to the left ankle that wraps around to the anterior left ankle as well.  After initial pain happened, she worked a full night shift on her feet and this made the pain worse today.  Pain sometimes improves a little bit when she initially walks but then worsens after a few steps.  She denies any popping or clicking to the left ankle and denies previous injury to the left ankle.  No numbness or tingling distally to pain/injury or extremity weakness.  Taking ibuprofen and Tylenol with some relief.   Foot Pain    Past Medical History:  Diagnosis Date   Anxiety    Asthma    Depression    Endometriosis    GERD (gastroesophageal reflux disease)    Headache     Patient Active Problem List   Diagnosis Date Noted   Anxiety and depression 04/25/2023   Panic attacks 03/16/2023   Severe obesity (BMI >= 40) (HCC) 03/16/2023   Mild sleep apnea 03/16/2023   Anemia 03/16/2023   Obesity (BMI 30-39.9) 08/02/2022   Weight gain 08/02/2022   Vitamin D deficiency 08/02/2022   Snores 08/02/2022   Daytime somnolence 08/02/2022   Hyperlipidemia 08/02/2022   Superior glenoid labrum lesion of right shoulder 08/02/2022   PCOS (polycystic ovarian syndrome) 07/26/2022   Pelvic floor dysfunction 07/26/2022   Pituitary adenoma (HCC) 07/26/2022   At risk for sleep apnea 04/19/2022   Migraine with aura and without status migrainosus, not intractable 04/13/2021    Endometriosis, unspecified 03/17/2021   PMDD (premenstrual dysphoric disorder) 03/17/2021   Essential hypertension 04/12/2020   Tachycardia 11/03/2018   Dizziness 08/19/2018   Paresthesia 08/19/2018   Shellfish allergy 06/11/2016   Dysfunction of both eustachian tubes 10/25/2015   Acute bronchitis 06/24/2015   Allergic reaction to food 06/24/2015   Moderate persistent asthma without complication 06/24/2015   Other seasonal allergic rhinitis 06/24/2015   Urticaria 06/24/2015   Musculoskeletal neck pain 11/10/2014   Referred otalgia 11/10/2014   Temporomandibular joint dysfunction 11/10/2014    Past Surgical History:  Procedure Laterality Date   EXTRACORPOREAL SHOCK WAVE LITHOTRIPSY Left 01/28/2022   Procedure: EXTRACORPOREAL SHOCK WAVE LITHOTRIPSY (ESWL);  Surgeon: Heloise Purpura, MD;  Location: [facility];  Service: Urology;  Laterality: Left;    OB History   No obstetric history on file.      Home Medications    Prior to Admission medications   Medication Sig Start Date End Date Taking? Authorizing Provider  naproxen (NAPROSYN) 500 MG tablet Take 1 tablet (500 mg total) by mouth 2 (two) times daily. 07/05/23  Yes Carlisle Beers, FNP  ABILIFY 5 MG tablet  03/13/23   [provider]  ALPRAZolam Prudy Feeler) 0.25 MG tablet Take 1 tablet (0.25 mg total) by mouth 2 (two) times daily as needed for anxiety. 03/14/23   Avanell Shackleton, NP-C  cetirizine (ZYRTEC) 10 MG tablet Take by mouth.    [provider]  cholecalciferol (VITAMIN D3) 25 MCG (1000 UNIT) tablet Take 1,000 Units by mouth daily.    [provider]  FLUoxetine (PROZAC) 40 MG capsule Take 40 mg by mouth daily. 02/06/22   [provider]  gabapentin (NEURONTIN) 100 MG capsule Take by mouth. Take 2 tablets 3 times daily 11/28/22   [provider]  hydrOXYzine (ATARAX) 25 MG tablet Take by mouth. 03/17/21   [provider]  mometasone (NASONEX) 50 MCG/ACT  nasal spray Place 2 sprays into the nose daily.    [provider]  ondansetron (ZOFRAN-ODT) 4 MG disintegrating tablet Take 4 mg by mouth every 12 (twelve) hours. 02/22/22   [provider]  PROAIR HFA 108 (90 Base) MCG/ACT inhaler Inhale into the lungs. 07/26/21   [provider]  rizatriptan (MAXALT) 10 MG tablet Take by mouth. 04/12/20   [provider]  tirzepatide (ZEPBOUND) 5 MG/0.5ML Pen Inject 5 mg into the skin once a week. 06/13/23   Henson, Vickie L, NP-C  verapamil (CALAN-SR) 120 MG CR tablet Take 120 mg by mouth at bedtime. 12/28/21   [provider]    Family History Family History  Problem Relation Age of Onset   Asthma Father     Social History Social History   Tobacco Use   Smoking status: Never   Smokeless tobacco: Never  Substance Use Topics   Alcohol use: Never   Drug use: Never     Allergies   Avocado, Betula alba oil, Kiwi extract, Shellfish allergy, Sulfa antibiotics, White birch, Latex, Sulfur, Avocado extract allergy skin test, Macrobid [nitrofurantoin], and Mango flavor   Review of Systems Review of Systems Per HPI  Physical Exam Triage Vital Signs ED Triage Vitals  Encounter Vitals Group     BP 07/05/23 1331 125/80     Systolic BP Percentile --      Diastolic BP Percentile --      Pulse Rate 07/05/23 1331 76     Resp 07/05/23 1331 18     Temp 07/05/23 1331 97.6 F (36.4 C)     Temp Source 07/05/23 1331 Oral     SpO2 07/05/23 1331 94 %     Weight --      Height --      Head Circumference --      Peak Flow --      Pain Score 07/05/23 1329 5     Pain Loc --      Pain Education --      Exclude from Growth Chart --    No data found.  Updated Vital Signs BP 125/80 (BP Location: Right Arm)   Pulse 76   Temp 97.6 F (36.4 C) (Oral)   Resp 18   LMP  (LMP Unknown)   SpO2 94%   Visual Acuity Right Eye Distance:   Left Eye Distance:   Bilateral Distance:    Right Eye Near:   Left Eye Near:     Bilateral Near:     Physical Exam Vitals and nursing note reviewed.  Constitutional:      Appearance: She is not ill-appearing or toxic-appearing.  HENT:     Head: Normocephalic and atraumatic.     Right Ear: Hearing and external ear normal.     Left Ear: Hearing and external ear normal.     Nose: Nose normal.     Mouth/Throat:     Lips: Pink.  Eyes:     General: Lids are normal. Vision grossly intact. Gaze aligned appropriately.     Extraocular Movements: Extraocular movements intact.     Conjunctiva/sclera: Conjunctivae normal.  Pulmonary:     Effort: Pulmonary effort is normal.  Musculoskeletal:     Cervical back: Neck supple.     Right ankle: Normal.     Left ankle: No swelling, deformity, ecchymosis or lacerations. Tenderness present over the lateral malleolus. Decreased range of motion. Anterior drawer test negative. Normal pulse.     Left Achilles Tendon: Normal.     Comments: Diffuse tenderness to palpation over the anterior lateral left ankle joint.  No signs of injury or overlying rash to skin.  5/5 strength against resistance with dorsiflexion and plantarflexion.  +2 bilateral dorsalis pedis pulses present with sensation intact distally.  Less than 2 cap refill distally.  Ambulatory with slight limp favoring the left ankle.  Skin:    General: Skin is warm and dry.     Capillary Refill: Capillary refill takes less than 2 seconds.     Findings: No rash.  Neurological:     General: No focal deficit present.     Mental Status: She is alert and oriented to person, place, and time. Mental status is at baseline.     Cranial Nerves: No dysarthria or facial asymmetry.  Psychiatric:        Mood and Affect: Mood normal.        Speech: Speech normal.        Behavior: Behavior normal.        Thought Content: Thought content normal.        Judgment: Judgment normal.      UC Treatments / Results  Labs (all labs ordered are listed, but only abnormal results are  displayed) Labs Reviewed - No data to display  EKG   Radiology No results found.  Procedures Procedures (including critical care time)  Medications Ordered in UC Medications - No data to display  Initial Impression / Assessment and Plan / UC Course  I have reviewed the triage vital signs and the nursing notes.  Pertinent labs & imaging results that were available during my care of the patient were reviewed by me and considered in my medical decision making (see chart for details).   1.  Left ankle tendinitis Presentation suspicious for tendinitis of the left ankle.  Deferred imaging given atraumatic mechanism of pain/injury and low suspicion for acute bony abnormality.  May purchase ankle brace over-the-counter and wear this for compression.  Advised to wear supportive shoes.  May use naproxen twice daily for the next 2 to 3 days, then as needed with food.  RICE advised.  May follow-up with [facility] in [place] or Ortho care here in [place] as needed.  Work note given.  Counseled patient on potential for adverse effects with medications prescribed/recommended today, strict ER and return-to-clinic precautions discussed, patient verbalized understanding.    Final Clinical Impressions(s) / UC Diagnoses   Final diagnoses:  Left ankle tendinitis     Discharge Instructions      You likely have a tendinitis to the ankle.  We treat this with naproxen twice a day for 2-3 days, then just as needed. Wear supportive shoes. Purchase an ankle brace and wear this for support. Ice 20 minutes on 20 minutes off, especially after work. Follow-up with either [facility] in [place] or [place] here in [place] if this doesn't get better.  If you develop any new or worsening symptoms or if your symptoms do not start to improve, please return here or follow-up with your primary care provider. If your symptoms are severe, please go to the emergency room.    ED  Prescriptions     Medication Sig Dispense Auth. Provider   naproxen (NAPROSYN) 500 MG tablet Take 1 tablet (500 mg total) by mouth 2 (two) times daily. 30 tablet Carlisle Beers, FNP      PDMP not reviewed this encounter.   Carlisle Beers, [place] 07/05/23 1407

## 2023-07-05 NOTE — Discharge Instructions (Addendum)
You likely have a tendinitis to the ankle.  We treat this with naproxen twice a day for 2-3 days, then just as needed. Wear supportive shoes. Purchase an ankle brace and wear this for support. Ice 20 minutes on 20 minutes off, especially after work. Follow-up with either [facility] in [place] or [place] here in [place] if this doesn't get better.  If you develop any new or worsening symptoms or if your symptoms do not start to improve, please return here or follow-up with your primary care provider. If your symptoms are severe, please go to the emergency room.

## 2023-07-05 NOTE — ED Triage Notes (Signed)
Pt reports she had a pulling pain in her left foot and ankle  x 1 day     Denies injury . States it hurts to apply pressure   Took tylenol

## 2023-07-18 ENCOUNTER — Encounter: Payer: Self-pay | Admitting: Family Medicine

## 2023-07-25 DIAGNOSIS — F330 Major depressive disorder, recurrent, mild: Secondary | ICD-10-CM | POA: Diagnosis not present

## 2023-07-28 NOTE — Telephone Encounter (Signed)
Pt attached screenshot of cost effective oral medications for weight loss

## 2023-08-05 DIAGNOSIS — Z0289 Encounter for other administrative examinations: Secondary | ICD-10-CM

## 2023-08-06 ENCOUNTER — Ambulatory Visit: Payer: BC Managed Care – PPO | Admitting: Bariatrics

## 2023-08-06 ENCOUNTER — Encounter: Payer: Self-pay | Admitting: Bariatrics

## 2023-08-06 VITALS — BP 116/69 | HR 72 | Temp 97.6°F | Ht 65.5 in | Wt 298.0 lb

## 2023-08-06 DIAGNOSIS — Z6841 Body Mass Index (BMI) 40.0 and over, adult: Secondary | ICD-10-CM | POA: Diagnosis not present

## 2023-08-06 DIAGNOSIS — E7800 Pure hypercholesterolemia, unspecified: Secondary | ICD-10-CM

## 2023-08-06 DIAGNOSIS — E282 Polycystic ovarian syndrome: Secondary | ICD-10-CM | POA: Diagnosis not present

## 2023-08-06 NOTE — Progress Notes (Signed)
Office: [phone]  /  Fax: [phone]   Initial Visit  Tara Singleton was seen in clinic today to evaluate for obesity. She is interested in losing weight to improve overall health and reduce the risk of weight related complications. She presents today to review program treatment options, initial physical assessment, and evaluation.     She was referred by: PCP  When asked what else they would like to accomplish? She states: Adopt healthier eating patterns, Improve energy levels and physical activity, and Improve quality of life  When asked how has your weight affected you? She states: Having fatigue and Having poor endurance  Some associated conditions: OSA and PCOS  Contributing factors: Family history of obesity  Weight promoting medications identified: Steroids  Current nutrition plan: Low-carb, High-protein, and Other: Keto  Current level of physical activity: None and NEAT  Current or previous pharmacotherapy: GLP-1 + GIP  Response to medication: Was cost prohibitive or lost coverage for AOM. She lost weight slowly but consistently.    Past medical history includes:   Past Medical History:  Diagnosis Date   Anxiety    Asthma    Depression    Endometriosis    GERD (gastroesophageal reflux disease)    Headache      Objective:   BP 116/69   Pulse 72   Temp 97.6 F (36.4 C)   Ht 5' 5.5" (1.664 m)   Wt 298 lb (135.2 kg)   SpO2 98%   BMI 48.84 kg/m  She was weighed on the bioimpedance scale: Body mass index is 48.84 kg/m.  Peak Weight:316 lbs , Body Fat%:56.4 %, Visceral Fat Rating:18, Weight trend over the last 12 months: Increasing  General:  Alert, oriented and cooperative. Patient is in no acute distress.  Respiratory: Normal respiratory effort, no problems with respiration noted  Extremities: Normal range of motion.    Mental Status: Normal mood and affect. Normal behavior. Normal judgment and thought content.   DIAGNOSTIC DATA  REVIEWED:  BMET    Component Value Date/Time   NA 140 03/14/2023 0834   K 3.8 03/14/2023 0834   CL 105 03/14/2023 0834   CO2 27 03/14/2023 0834   GLUCOSE 83 03/14/2023 0834   BUN 11 03/14/2023 0834   CREATININE 0.80 03/14/2023 0834   CALCIUM 9.6 03/14/2023 0834   GFRNONAA >60 02/27/2022 1115   Lab Results  Component Value Date   HGBA1C 5.4 07/26/2022   No results found for: "INSULIN" CBC    Component Value Date/Time   WBC 5.1 03/14/2023 0834   RBC 4.34 03/14/2023 0834   HGB 12.7 03/14/2023 0834   HCT 39.3 03/14/2023 0834   PLT 255.0 03/14/2023 0834   MCV 90.4 03/14/2023 0834   MCH 30.1 02/27/2022 1000   MCHC 32.3 03/14/2023 0834   RDW 14.2 03/14/2023 0834   Iron/TIBC/Ferritin/ %Sat    Component Value Date/Time   IRON 107 03/14/2023 0834   TIBC 370 03/14/2023 0834   FERRITIN 18 03/14/2023 0834   IRONPCTSAT 29 03/14/2023 0834   Lipid Panel     Component Value Date/Time   CHOL 218 (H) 03/14/2023 0834   TRIG 93.0 03/14/2023 0834   HDL 52.50 03/14/2023 0834   CHOLHDL 4 03/14/2023 0834   VLDL 18.6 03/14/2023 0834   LDLCALC 147 (H) 03/14/2023 0834   Hepatic Function Panel     Component Value Date/Time   PROT 6.9 03/14/2023 0834   ALBUMIN 4.0 03/14/2023 0834   AST 14 03/14/2023 0834   ALT 17  03/14/2023 0834   ALKPHOS 67 03/14/2023 0834   BILITOT 0.4 03/14/2023 0834      Component Value Date/Time   TSH 1.57 03/14/2023 0834     Assessment and Plan:    Polycystic Ovarian Syndrome (PCOS)  She was diagnosed with PCOS at the age of 65. She has always had difficulty with her weight. She has been on the following medications: IUD.    Plan:   Will discuss PCOS and insulin resistance. Will decrease her carbohydrates (starches and sweets).    Elevated cholesterol:  LDL is not at goal. Medication(s): none, working on diet and exercise.  Cardiovascular risk factors: obesity (BMI >= 30 kg/m2) and sedentary lifestyle  Lab Results  Component Value Date    CHOL 218 (H) 03/14/2023   HDL 52.50 03/14/2023   LDLCALC 147 (H) 03/14/2023   TRIG 93.0 03/14/2023   CHOLHDL 4 03/14/2023   Lab Results  Component Value Date   ALT 17 03/14/2023   AST 14 03/14/2023   ALKPHOS 67 03/14/2023   BILITOT 0.4 03/14/2023   The ASCVD Risk score (Arnett DK, et al., 2019) failed to calculate for the following reasons:   The 2019 ASCVD risk score is only valid for ages 67 to 41  Plan:   Will avoid all trans fats.  Will read labels Will minimize saturated fats except the following: low fat meats in moderation, diary, and limited dark chocolate.  Will begin our program.       Morbid Obesity: Current BMI 48.84    Obesity Treatment / Action Plan:  Patient will work on garnering support from family and friends to begin weight loss journey. Will work on eliminating or reducing the presence of highly palatable, calorie dense foods in the home. Will complete provided nutritional and psychosocial assessment questionnaire before the next appointment. Will be scheduled for indirect calorimetry to determine resting energy expenditure in a fasting state.  This will allow [place] to create a reduced calorie, high-protein meal plan to promote loss of fat mass while preserving muscle mass. Counseled on the health benefits of losing 5%-15% of total body weight. Was counseled on nutritional approaches to weight loss and benefits of reducing processed foods and consuming plant-based foods and high quality protein as part of nutritional weight management. Was counseled on pharmacotherapy and role as an adjunct in weight management.   Obesity Education Performed Today:  She was weighed on the bioimpedance scale and results were discussed and documented in the synopsis.  We discussed obesity as a disease and the importance of a more detailed evaluation of all the factors contributing to the disease.  We discussed the importance of long term lifestyle changes which include  nutrition, exercise and behavioral modifications as well as the importance of customizing this to her specific health and social needs.  We discussed the benefits of reaching a healthier weight to alleviate the symptoms of existing conditions and reduce the risks of the biomechanical, metabolic and psychological effects of obesity.  Discussed New Patient/Late Arrival, and Cancellation Policies. Patient voiced understanding and allowed to ask questions.   Eufelia Stepper appears to be in the action stage of change and states they are ready to start intensive lifestyle modifications and behavioral modifications.  30 minutes was spent today on this visit including the above counseling, pre-visit chart review, and post-visit documentation.  Reviewed by clinician on day of visit: allergies, medications, problem list, medical history, surgical history, family history, social history, and previous encounter notes.  Fahd Galea A. Lorretta HarpO.

## 2023-08-15 DIAGNOSIS — F330 Major depressive disorder, recurrent, mild: Secondary | ICD-10-CM | POA: Diagnosis not present

## 2023-08-22 DIAGNOSIS — F330 Major depressive disorder, recurrent, mild: Secondary | ICD-10-CM | POA: Diagnosis not present

## 2023-09-01 ENCOUNTER — Ambulatory Visit (INDEPENDENT_AMBULATORY_CARE_PROVIDER_SITE_OTHER): Payer: BC Managed Care – PPO | Admitting: Bariatrics

## 2023-09-01 ENCOUNTER — Encounter: Payer: Self-pay | Admitting: Bariatrics

## 2023-09-01 ENCOUNTER — Ambulatory Visit (HOSPITAL_COMMUNITY)
Admission: RE | Admit: 2023-09-01 | Discharge: 2023-09-01 | Disposition: A | Discharge status: Home / Self Care | Payer: BC Managed Care – PPO | Source: Ambulatory Visit | Attending: Internal Medicine | Admitting: Internal Medicine

## 2023-09-01 ENCOUNTER — Other Ambulatory Visit: Payer: Self-pay

## 2023-09-01 VITALS — BP 128/74 | HR 68 | Temp 97.8°F | Ht 65.5 in | Wt 300.0 lb

## 2023-09-01 DIAGNOSIS — R5383 Other fatigue: Secondary | ICD-10-CM | POA: Diagnosis not present

## 2023-09-01 DIAGNOSIS — E7800 Pure hypercholesterolemia, unspecified: Secondary | ICD-10-CM

## 2023-09-01 DIAGNOSIS — E282 Polycystic ovarian syndrome: Secondary | ICD-10-CM

## 2023-09-01 DIAGNOSIS — R0602 Shortness of breath: Secondary | ICD-10-CM | POA: Diagnosis not present

## 2023-09-01 DIAGNOSIS — E559 Vitamin D deficiency, unspecified: Secondary | ICD-10-CM

## 2023-09-01 DIAGNOSIS — Z1331 Encounter for screening for depression: Secondary | ICD-10-CM

## 2023-09-01 DIAGNOSIS — Z0000 Encounter for general adult medical examination without abnormal findings: Secondary | ICD-10-CM

## 2023-09-01 DIAGNOSIS — E66813 Obesity, class 3: Secondary | ICD-10-CM

## 2023-09-01 DIAGNOSIS — Z6841 Body Mass Index (BMI) 40.0 and over, adult: Secondary | ICD-10-CM

## 2023-09-01 NOTE — Progress Notes (Signed)
At a Glance:  Vitals Temp: 97.8 F (36.6 C) BP: 128/74 Pulse Rate: 68 SpO2: 100 %   Anthropometric Measurements Height: 5' 5.5" (1.664 m) Weight: 300 lb (136.1 kg) BMI (Calculated): 49.15 Starting Weight: 300lb Peak Weight: 316lb   Body Composition  Body Fat %: 57 % Fat Mass (lbs): 171.4 lbs Muscle Mass (lbs): 122.8 lbs Visceral Fat Rating : 19   Other Clinical Data RMR: 1987 Fasting: yes Labs: yes Today's Visit #: 1    EKG: Normal sinus rhythm, rate 69.  Indirect Calorimeter:   Resting Metabolic Rate ( RMR):  RMR (actual): 1987 kcal RMR (calculated): 1945 kcal The calculated basal metabolic rate is less thus her basal metabolic rate is  essentially the same as her   expected.  Plan:   Indirect calorimeter completed, interpreted and reviewed with patient today and allowed to ask questions.  Discussed the implications for the chosen plan and exercise based on the RMR reading.  Will consider repeating the RMR in the future based on weight loss.    Chief Complaint:  Obesity   Subjective:  Tara Singleton (MR# [id number]) is a 37 y.o. female who presents for evaluation and treatment of obesity and related comorbidities.   [name] is currently in the action stage of change and ready to dedicate time achieving and maintaining a healthier weight. [name] is interested in becoming our patient and working on intensive lifestyle modifications including (but not limited to) diet and exercise for weight loss.  [name] has been struggling with her weight. She has been unsuccessful in either losing weight, maintaining weight loss, or reaching her healthy weight goal.  [name]'s habits were reviewed today and are as follows: Her family eats meals together, she thinks her family will eat healthier with her, she started gaining weight as a child, her heaviest weight ever was 316 pounds, she has significant food cravings issues, she skips meals frequently, and she is  frequently drinking liquids with calories.   She started gaining weight as a child. She has dealt with weight issues for most of her life.   Current or previous pharmacotherapy: GLP-1 + GIP (Zepbound) local reaction, not systemic. She lost some weight.  Response to medication: Was cost prohibitive or lost coverage for AOM.  Other Fatigue [name] admits to daytime somnolence and admits to waking up still tired. Patient has a history of symptoms of daytime fatigue and morning fatigue. [name] generally gets about 5 to 8  hours of sleep per night, and states that she has difficulty falling asleep and difficulty falling back asleep if awakened. Snoring is present. Apneic episodes are present. Epworth Sleepiness Score is 5.   Shortness of Breath [name] notes increasing shortness of breath with exercising and seems to be worsening over time with weight gain. She notes getting out of breath sooner with activity than she used to. This has not gotten worse recently. [name] denies shortness of breath at rest or orthopnea.  Depression Screen [name]'s Food and Mood (modified PHQ-9) score was 15. 15-19 moderate severe depression     06/13/2023    8:27 AM  Depression screen PHQ 2/9  Decreased Interest 1  Down, Depressed, Hopeless 1  PHQ - 2 Score 2  Altered sleeping 1  Tired, decreased energy 1  Change in appetite 0  Feeling bad or failure about yourself  0  Trouble concentrating 1  Moving slowly or fidgety/restless 0  Suicidal thoughts 0  PHQ-9 Score 5  Difficult doing work/chores Somewhat difficult  Assessment and Plan:   Other Fatigue [name] does feel that her weight is causing her energy to be lower than it should be. Fatigue may be related to obesity, depression or many other causes. Labs will be ordered, and in the meanwhile, [name] will focus on self care including making healthy food choices, increasing physical activity and focusing on stress reduction.  Shortness of  Breath [name] does feel that she gets out of breath more easily that she used to when she exercises. [name]'s shortness of breath appears to be obesity related and exercise induced. She has agreed to work on weight loss and gradually increase exercise to treat her exercise induced shortness of breath. Will continue to monitor closely.  Health Maintenance:   Obesity   Plan: Will do EKG, indirect calorimetry, and labs.     Vitamin D Deficiency Vitamin D is at goal of 50.  Most recent vitamin D level was 49. She is at risk for vitamin D deficiency due to obesity.   Lab Results  Component Value Date   VD25OH 49.63 07/26/2022    Plan: Will check for vitamin D deficiency.   [name] had a positive depression screening. Depression is commonly associated with obesity and often results in emotional eating behaviors. We will monitor this closely and work on CBT to help improve the non-hunger eating patterns. Referral to Psychology may be required if no improvement is seen as she continues in our clinic.   Polycystic Ovarian Syndrome (PCOS)  She was diagnosed with PCOS at the age of 55. She has always had difficulty with her weight. She has been on the following medications:  IUD.   Plan: Will work on the plan and exercise.   Elevated cholesterol:  LDL is not at goal. Medication(s): none, age less than 40  Cardiovascular risk factors: obesity (BMI >= 30 kg/m2) and sedentary lifestyle  Lab Results  Component Value Date   CHOL 218 (H) 03/14/2023   HDL 52.50 03/14/2023   LDLCALC 147 (H) 03/14/2023   TRIG 93.0 03/14/2023   CHOLHDL 4 03/14/2023   Lab Results  Component Value Date   ALT 17 03/14/2023   AST 14 03/14/2023   ALKPHOS 67 03/14/2023   BILITOT 0.4 03/14/2023   The ASCVD Risk score (Arnett DK, et al., 2019) failed to calculate for the following reasons:   The 2019 ASCVD risk score is only valid for ages 64 to 90  Plan:  Will avoid all trans fats.  Will read  labels Will minimize saturated fats except the following: low fat meats in moderation, diary, and limited dark chocolate.      Plan:   Will discuss PCOS and insulin resistance. Will decrease her carbohydrates (starches and sweets).    Plan: Will begin work on the plan and exercise.   Previous labs reviewed today. Date: 03/14/2023 (   CMP, Lipids, and TSH, glucose, and CBC  Labs done today CMP, Lipids, Insulin, HgbA1c, and Vit D   Morbid Obesity: BMI (Calculated): 49.15   Shaquille is currently in the action stage of change and her goal is to begin weight loss efforts. I recommend Ainara begin the structured treatment plan as follows:  She has agreed to Category 3 Plan  Exercise goals: For substantial health benefits, adults should do at least 150 minutes (2 hours and 30 minutes) a week of moderate-intensity, or 75 minutes (1 hour and 15 minutes) a week of vigorous-intensity aerobic physical activity, or an equivalent combination of moderate- and vigorous-intensity aerobic activity.  Aerobic activity should be performed in episodes of at least 10 minutes, and preferably, it should be spread throughout the week.  Behavioral modification strategies:increasing lean protein intake, increasing vegetables, increase H2O intake, increase high fiber foods, no skipping meals, meal planning and cooking strategies, keeping healthy foods in the home, and avoiding temptations  She was informed of the importance of frequent follow-up visits to maximize her success with intensive lifestyle modifications for her multiple health conditions. She was informed we would discuss her lab results at her next visit unless there is a critical issue that needs to be addressed sooner. Jerni agreed to keep her next visit at the agreed upon time to discuss these results.  Objective:  General: Cooperative, alert, well developed, in no acute distress. HEENT: Conjunctivae and lids unremarkable. Cardiovascular:  Regular rhythm.  Lungs: Normal work of breathing. Neurologic: No focal deficits.   Lab Results  Component Value Date   CREATININE 0.80 03/14/2023   BUN 11 03/14/2023   NA 140 03/14/2023   K 3.8 03/14/2023   CL 105 03/14/2023   CO2 27 03/14/2023   Lab Results  Component Value Date   ALT 17 03/14/2023   AST 14 03/14/2023   ALKPHOS 67 03/14/2023   BILITOT 0.4 03/14/2023   Lab Results  Component Value Date   HGBA1C 5.4 07/26/2022   No results found for: "INSULIN" Lab Results  Component Value Date   TSH 1.57 03/14/2023   Lab Results  Component Value Date   CHOL 218 (H) 03/14/2023   HDL 52.50 03/14/2023   LDLCALC 147 (H) 03/14/2023   TRIG 93.0 03/14/2023   CHOLHDL 4 03/14/2023   Lab Results  Component Value Date   WBC 5.1 03/14/2023   HGB 12.7 03/14/2023   HCT 39.3 03/14/2023   MCV 90.4 03/14/2023   PLT 255.0 03/14/2023   Lab Results  Component Value Date   IRON 107 03/14/2023   TIBC 370 03/14/2023   FERRITIN 18 03/14/2023    Attestation Statements:  Applicable history such as the following:  allergies, medications, problem list, medical history, surgical history, family history, social history, and previous encounter notes reviewed by clinician on day of visit:  Time spent on visit including the items listed below was 42 minutes.  -preparing to see the patient (e.g., review of tests, history, previous notes) -obtaining and/or reviewing separately obtained history -counseling and educating the patient/family/caregiver -documenting clinical information in the electronic or other health record -ordering medications, tests, or procedures -independently interpreting results and communicating results to the patient/ family/caregiver -referring and communicating with other health care professionals  -care coordination   This may have been prepared with the assistance of Engineer, civil (consulting).  Occasional wrong-word or sound-a-like substitutions may have  occurred due to the inherent limitations of voice recognition software.   Corinna Capra, DO

## 2023-09-02 ENCOUNTER — Encounter: Payer: Self-pay | Admitting: Bariatrics

## 2023-09-02 DIAGNOSIS — E88819 Insulin resistance, unspecified: Secondary | ICD-10-CM | POA: Insufficient documentation

## 2023-09-02 LAB — LIPID PANEL WITH LDL/HDL RATIO
Cholesterol, Total: 252 mg/dL — ABNORMAL HIGH (ref 100–199)
HDL: 57 mg/dL (ref >39)
LDL Chol Calc (NIH): 177 mg/dL — ABNORMAL HIGH (ref 0–99)
LDL/HDL Ratio: 3.1 {ratio} (ref 0.0–3.2)
Triglycerides: 104 mg/dL (ref 0–149)
VLDL Cholesterol Cal: 18 mg/dL (ref 5–40)

## 2023-09-02 LAB — COMPREHENSIVE METABOLIC PANEL
ALT: 21 [IU]/L (ref 0–32)
AST: 16 [IU]/L (ref 0–40)
Albumin: 4.4 g/dL (ref 3.9–4.9)
Alkaline Phosphatase: 107 [IU]/L (ref 44–121)
BUN/Creatinine Ratio: 14 (ref 9–23)
BUN: 12 mg/dL (ref 6–20)
Bilirubin Total: 0.2 mg/dL (ref 0.0–1.2)
CO2: 22 mmol/L (ref 20–29)
Calcium: 9.8 mg/dL (ref 8.7–10.2)
Chloride: 102 mmol/L (ref 96–106)
Creatinine, Ser: 0.86 mg/dL (ref 0.57–1.00)
Globulin, Total: 2.5 g/dL (ref 1.5–4.5)
Glucose: 87 mg/dL (ref 70–99)
Potassium: 4.4 mmol/L (ref 3.5–5.2)
Sodium: 140 mmol/L (ref 134–144)
Total Protein: 6.9 g/dL (ref 6.0–8.5)
eGFR: 90 mL/min/{1.73_m2} (ref >59)

## 2023-09-02 LAB — VITAMIN D 25 HYDROXY (VIT D DEFICIENCY, FRACTURES): Vit D, 25-Hydroxy: 32.5 ng/mL (ref 30.0–100.0)

## 2023-09-02 LAB — HEMOGLOBIN A1C
Est. average glucose Bld gHb Est-mCnc: 111 mg/dL
Hgb A1c MFr Bld: 5.5 % (ref 4.8–5.6)

## 2023-09-02 LAB — INSULIN, RANDOM: INSULIN: 14 u[IU]/mL (ref 2.6–24.9)

## 2023-09-11 ENCOUNTER — Ambulatory Visit (INDEPENDENT_AMBULATORY_CARE_PROVIDER_SITE_OTHER): Payer: BC Managed Care – PPO

## 2023-09-11 ENCOUNTER — Other Ambulatory Visit: Payer: Self-pay

## 2023-09-11 ENCOUNTER — Ambulatory Visit
Admission: EM | Admit: 2023-09-11 | Discharge: 2023-09-11 | Disposition: A | Discharge status: Home / Self Care | Payer: BC Managed Care – PPO | Attending: Family Medicine | Admitting: Family Medicine

## 2023-09-11 DIAGNOSIS — M79672 Pain in left foot: Secondary | ICD-10-CM

## 2023-09-11 DIAGNOSIS — M7989 Other specified soft tissue disorders: Secondary | ICD-10-CM | POA: Diagnosis not present

## 2023-09-11 MED ORDER — PREDNISONE 20 MG PO TABS: 40.0000 mg | ORAL_TABLET | Freq: Every day | ORAL | 0 refills | Status: DC

## 2023-09-11 MED ORDER — CYCLOBENZAPRINE HCL 5 MG PO TABS: 5.0000 mg | ORAL_TABLET | Freq: Every evening | ORAL | 0 refills | Status: DC | PRN

## 2023-09-11 NOTE — ED Triage Notes (Signed)
 Pt reports left foot pain distal to fifth digit for last several days. Denies any known injury.

## 2023-09-11 NOTE — ED Provider Notes (Signed)
 [facility]    CSN: [id number] Arrival date & time: 09/11/23  1232      History   Chief Complaint Chief Complaint  Patient presents with   Foot Pain    HPI Tara Singleton is a 38 y.o. female.   Patient presenting today with several day history of left lateral foot pain at the base of the fifth toe along with some swelling.  States it hurts constantly, whether weightbearing or not.  Denies known injury, numbness, tingling, discoloration.  Recent strained tendon in this foot as she has been treating with naproxen , taking the naproxen  now with this pain and only getting slight relief temporarily with this.    Past Medical History:  Diagnosis Date   Anxiety    Asthma    B12 deficiency    Constipation    Depression    Endometriosis    GERD (gastroesophageal reflux disease)    Headache    Infertility, female    PCOS (polycystic ovarian syndrome)    Sleep apnea    Swelling of lower extremity     Patient Active Problem List   Diagnosis Date Noted   Insulin  resistance 09/02/2023   Anxiety and depression 04/25/2023   Panic attacks 03/16/2023   Severe obesity (BMI >= 40) (HCC) 03/16/2023   Mild sleep apnea 03/16/2023   Anemia 03/16/2023   Obesity (BMI 30-39.9) 08/02/2022   Weight gain 08/02/2022   Vitamin D  deficiency 08/02/2022   Snores 08/02/2022   Daytime somnolence 08/02/2022   Hyperlipidemia 08/02/2022   Superior glenoid labrum lesion of right shoulder 08/02/2022   PCOS (polycystic ovarian syndrome) 07/26/2022   Pelvic floor dysfunction 07/26/2022   Pituitary adenoma (HCC) 07/26/2022   At risk for sleep apnea 04/19/2022   Migraine with aura and without status migrainosus, not intractable 04/13/2021   Endometriosis, unspecified 03/17/2021   PMDD (premenstrual dysphoric disorder) 03/17/2021   Essential hypertension 04/12/2020   Tachycardia 11/03/2018   Dizziness 08/19/2018   Paresthesia 08/19/2018   Shellfish allergy 06/11/2016   Dysfunction of  both eustachian tubes 10/25/2015   Acute bronchitis 06/24/2015   Allergic reaction to food 06/24/2015   Moderate persistent asthma without complication 06/24/2015   Other seasonal allergic rhinitis 06/24/2015   Urticaria 06/24/2015   Musculoskeletal neck pain 11/10/2014   Referred otalgia 11/10/2014   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 Authorizing Provider  cyclobenzaprine  (FLEXERIL ) 5 MG tablet Take 1 tablet (5 mg total) by mouth at bedtime as needed for muscle spasms. Do not drink alcohol or drive while taking this medication.  May cause drowsiness. 09/11/23  Yes Stuart Vernell Norris, PA-C  predniSONE  (DELTASONE ) 20 MG tablet  Take 2 tablets (40 mg total) by mouth daily with breakfast. 09/11/23  Yes Stuart Vernell Norris, PA-C  ABILIFY 5 MG tablet  03/13/23   [provider]  ALPRAZolam  (XANAX ) 0.25 MG tablet Take 1 tablet (0.25 mg total) by mouth 2 (two) times daily as needed for anxiety. 03/14/23   Henson, Vickie L, NP-C  cetirizine (ZYRTEC) 10 MG tablet Take by mouth.    [provider]  cholecalciferol (VITAMIN D3) 25 MCG (1000 UNIT) tablet Take 1,000 Units by mouth daily. Patient not taking: Reported on 09/01/2023    [provider]  FLUoxetine (PROZAC) 40 MG capsule Take 40 mg by mouth daily. 02/06/22   [provider]  gabapentin (NEURONTIN) 100 MG capsule Take by mouth. Take 2 tablets 3  times daily Patient not taking: Reported on 09/01/2023 11/28/22   [provider]  hydrOXYzine (ATARAX) 25 MG tablet Take by mouth. Patient not taking: Reported on 09/01/2023 03/17/21   [provider]  mometasone (NASONEX) 50 MCG/ACT nasal spray Place 2 sprays into the nose daily.    [provider]  naproxen  (NAPROSYN ) 500 MG tablet Take 1 tablet (500 mg total) by mouth 2 (two) times daily. Patient not taking: Reported on 09/01/2023 07/05/23   Enedelia Dorna HERO, FNP  ondansetron  (ZOFRAN -ODT) 4 MG disintegrating tablet Take 4 mg by mouth every 12 (twelve) hours. 02/22/22   [provider]  PROAIR  HFA 108 (90 Base) MCG/ACT inhaler Inhale into the lungs. 07/26/21   [provider]  rizatriptan  (MAXALT ) 10 MG tablet Take by mouth. 04/12/20   [provider]  verapamil  (CALAN -SR) 120 MG CR tablet Take 120 mg by mouth at bedtime. 12/28/21   [provider]    Family History Family History  Problem Relation Age of Onset   High blood pressure Mother    High Cholesterol Mother    Stroke Mother    Thyroid disease Mother    Cancer Mother    Depression Mother    Anxiety disorder Mother    Obesity Mother    Asthma Father    Anxiety disorder Father    Sleep apnea Father    Alcoholism Father    Drug abuse Father    Obesity Father     Social History Social History   Tobacco Use   Smoking status: Never   Smokeless tobacco: Never  Substance Use Topics   Alcohol use: Never   Drug use: Never     Allergies   Avocado, Betula alba oil, Kiwi extract, Shellfish allergy, Sulfa antibiotics, White birch, Latex, Sulfur, Avocado extract allergy skin test, Macrobid [nitrofurantoin], and Mango flavoring agent (non-screening)   Review of Systems Review of Systems Per HPI  Physical Exam Triage Vital Signs ED Triage Vitals  Encounter Vitals Group     BP 09/11/23 1339 137/83     Systolic BP Percentile --      Diastolic BP Percentile  --      Pulse Rate 09/11/23 1339 67     Resp 09/11/23 1339 20     Temp 09/11/23 1339 98.1 F (36.7 C)     Temp Source 09/11/23 1339 Oral     SpO2 09/11/23 1339 97 %     Weight --      Height --      Head Circumference --      Peak Flow --      Pain Score 09/11/23 1337 4     Pain Loc --  Pain Education --      Exclude from Growth Chart --    No data found.  Updated Vital Signs BP 137/83 (BP Location: Right Arm)   Pulse 67   Temp 98.1 F (36.7 C) (Oral)   Resp 20   LMP  (Approximate)   SpO2 97%   Visual Acuity Right Eye Distance:   Left Eye Distance:   Bilateral Distance:    Right Eye Near:   Left Eye Near:    Bilateral Near:     Physical Exam Vitals and nursing note reviewed.  Constitutional:      Appearance: Normal appearance. She is not ill-appearing.  HENT:     Head: Atraumatic.  Eyes:     Extraocular Movements: Extraocular movements intact.     Conjunctiva/sclera: Conjunctivae normal.  Cardiovascular:     Rate and Rhythm: Normal rate and regular rhythm.     Heart sounds: Normal heart sounds.  Pulmonary:     Effort: Pulmonary effort is normal.     Breath sounds: Normal breath sounds.  Musculoskeletal:        General: Tenderness present. No swelling, deformity or signs of injury. Normal range of motion.     Cervical back: Normal range of motion and neck supple.     Comments: Tender to palpation at the base of the left fifth toe into fifth metatarsal.  Skin:    General: Skin is warm and dry.     Findings: No bruising or erythema.  Neurological:     Mental Status: She is alert and oriented to person, place, and time.     Comments: Left foot neurovascularly intact  Psychiatric:        Mood and Affect: Mood normal.        Thought Content: Thought content normal.        Judgment: Judgment normal.      UC Treatments / Results  Labs (all labs ordered are listed, but only abnormal results are displayed) Labs Reviewed - No data to  display  EKG   Radiology No results found.  Procedures Procedures (including critical care time)  Medications Ordered in UC Medications - No data to display  Initial Impression / Assessment and Plan / UC Course  I have reviewed the triage vital signs and the nursing notes.  Pertinent labs & imaging results that were available during my care of the patient were reviewed by me and considered in my medical decision making (see chart for details).     X-ray of the left foot showing no bony abnormalities.  Suspect inflammatory/soft tissue cause.  Will trial prednisone , muscle relaxers, Epsom salt soaks and elevation and follow-up with podiatry if not resolving.  Final Clinical Impressions(s) / UC Diagnoses   Final diagnoses:  Left foot pain     Discharge Instructions      In addition to the prescribed medications, you may do warm Epsom salt soaks, massage, and wear supportive shoes.  Follow-up with [facility] if not resolving.    ED Prescriptions     Medication Sig Dispense Auth. Provider   predniSONE  (DELTASONE ) 20 MG tablet Take 2 tablets (40 mg total) by mouth daily with breakfast. 10 tablet Stuart Vernell Norris, PA-C   cyclobenzaprine  (FLEXERIL ) 5 MG tablet Take 1 tablet (5 mg total) by mouth at bedtime as needed for muscle spasms. Do not drink alcohol or drive while taking this medication.  May cause drowsiness. 15 tablet Stuart Vernell Norris, PA-C  PDMP not reviewed this encounter.   Stuart Vernell Norris, [place] 09/11/23 1531

## 2023-09-11 NOTE — Discharge Instructions (Signed)
 In addition to the prescribed medications, you may do warm Epsom salt soaks, massage, and wear supportive shoes.  Follow-up with [facility] if not resolving.

## 2023-09-12 ENCOUNTER — Ambulatory Visit: Payer: BC Managed Care – PPO | Admitting: Family Medicine

## 2023-09-12 DIAGNOSIS — F330 Major depressive disorder, recurrent, mild: Secondary | ICD-10-CM | POA: Diagnosis not present

## 2023-09-16 ENCOUNTER — Ambulatory Visit: Payer: BC Managed Care – PPO | Admitting: Nurse Practitioner

## 2023-09-17 ENCOUNTER — Ambulatory Visit: Payer: BC Managed Care – PPO | Admitting: Family Medicine

## 2023-09-17 ENCOUNTER — Ambulatory Visit: Payer: BC Managed Care – PPO | Admitting: Bariatrics

## 2023-09-19 DIAGNOSIS — F330 Major depressive disorder, recurrent, mild: Secondary | ICD-10-CM | POA: Diagnosis not present

## 2023-09-20 DIAGNOSIS — F330 Major depressive disorder, recurrent, mild: Secondary | ICD-10-CM | POA: Diagnosis not present

## 2023-09-22 ENCOUNTER — Ambulatory Visit: Payer: BC Managed Care – PPO | Admitting: Bariatrics

## 2023-09-22 ENCOUNTER — Encounter: Payer: Self-pay | Admitting: Bariatrics

## 2023-09-22 VITALS — BP 111/73 | HR 78 | Temp 97.3°F | Ht 65.5 in | Wt 299.0 lb

## 2023-09-22 DIAGNOSIS — E7800 Pure hypercholesterolemia, unspecified: Secondary | ICD-10-CM | POA: Diagnosis not present

## 2023-09-22 DIAGNOSIS — R632 Polyphagia: Secondary | ICD-10-CM

## 2023-09-22 DIAGNOSIS — Z6841 Body Mass Index (BMI) 40.0 and over, adult: Secondary | ICD-10-CM

## 2023-09-22 DIAGNOSIS — E88819 Insulin resistance, unspecified: Secondary | ICD-10-CM | POA: Diagnosis not present

## 2023-09-22 DIAGNOSIS — E66813 Obesity, class 3: Secondary | ICD-10-CM

## 2023-09-22 MED ORDER — WEGOVY 0.25 MG/0.5ML ~~LOC~~ SOAJ: 0.2500 mg | SUBCUTANEOUS | 0 refills | Status: DC

## 2023-09-22 NOTE — Progress Notes (Signed)
 First follow-up after initial visit.        WEIGHT SUMMARY AND BIOMETRICS  Weight Lost Since Last Visit: 1lb  Weight Gained Since Last Visit: 0   Vitals Temp: (!) 97.3 F (36.3 C) BP: 111/73 Pulse Rate: 78 SpO2: 100 %   Anthropometric Measurements Height: 5' 5.5 (1.664 m) Weight: 299 lb (135.6 kg) BMI (Calculated): 48.98 Weight at Last Visit: 300lb Weight Lost Since Last Visit: 1lb Weight Gained Since Last Visit: 0 Starting Weight: 300lb Total Weight Loss (lbs): 1 lb (0.454 kg)   Body Composition  Body Fat %: 56.7 % Fat Mass (lbs): 170 lbs Muscle Mass (lbs): 123.2 lbs Visceral Fat Rating : 19   Other Clinical Data Fasting: yes Labs: no Today's Visit #: 2 Starting Date: 09/01/23    OBESITY Tara Singleton is here to discuss her progress with her obesity treatment plan along with follow-up of her obesity related diagnoses.    Nutrition Plan: the Category 3 plan - 50% adherence.  Current exercise: none  Interim History:  She is down 1 lb since her last visit.  Eating all of the food on the plan., Protein intake is as prescribed, Protein intake is less than prescribed., Is skipping meals, Water  intake is adequate., and Reports polyphagia  Initial positives regarding the dietary plan: fairly easy.  Initial challenges regarding  the dietary plan: need certain substitutions for protein.   Hunger is poorly controlled.  Cravings are moderately controlled.  Assessment/Plan:   Insulin  Resistance [name] has had elevated fasting insulin  readings. Goal is HgbA1c < 5.7, fasting insulin  at l0 or less, and preferably at 5.  She reports polyphagia. Medication(s): none.  She has been on Zepbound  in the past with good results, but had redness and itching at the site of her injection.  Lab Results  Component Value Date   HGBA1C 5.5 09/01/2023   Lab  Results  Component Value Date   INSULIN  14.0 09/01/2023    Plan Medication(s): Wegovy  0.25 mg SQ weekly Will work on the agreed upon plan. Will minimize refined carbohydrates ( sweets and starches), and focus more on complex carbohydrates.  Increase the micronutrients found in leafy greens, which include magnesium, polyphenols, and vitamin C which have been postulated to help with insulin  sensitivity. Minimize fast food and cook more meals at home.  Increase fiber to 25 to 30 grams daily.  Information sheet on  Insulin  Resistance and Prediabetes.   She will take an antihistamine the day before and the day of her Wegovy  injection and will use a hydrocortisone cream to the site.   Elevated Cholesterol:  LDL is not at goal. Her total cholesterol is not at goal.  Medication(s): none Cardiovascular risk factors: obesity (BMI >= 30 kg/m2) and sedentary lifestyle  Lab Results  Component Value Date   CHOL 252 (H) 09/01/2023   HDL 57 09/01/2023   LDLCALC 177 (H) 09/01/2023   TRIG 104 09/01/2023   CHOLHDL 4 03/14/2023   Lab Results  Component Value Date   ALT 21 09/01/2023   AST 16 09/01/2023   ALKPHOS 107 09/01/2023   BILITOT 0.2 09/01/2023   The ASCVD Risk score (Arnett DK, et al., 2019) failed to calculate for the following reasons:   The 2019 ASCVD risk score is only valid for ages 85 to 28  Plan:  Information sheet on healthy vs unhealthy fats.  Will avoid all trans fats.  Will read labels Will minimize saturated fats except the following: low fat meats in moderation, diary, and  limited dark chocolate.  Increase Omega 3 in foods, and consider an Omega 3 supplement.       Morbid Obesity: Current BMI BMI (Calculated): 48.98   Pharmacotherapy Plan Start  Wegovy  0.25 mg SQ weekly  Shana is currently in the action stage of change. As such, her goal is to continue with weight loss efforts.  She has agreed to the Category 3 plan.  Exercise goals: All adults should  avoid inactivity. Some physical activity is better than none, and adults who participate in any amount of physical activity gain some health benefits.  Behavioral modification strategies: increasing lean protein intake, decreasing simple carbohydrates , no meal skipping, decrease eating out, increase water  intake, planning for success, increasing fiber rich foods, keep healthy foods in the home, weigh protein portions, and mindful eating.  Victor has agreed to follow-up with our clinic in 2 weeks at the [facility] location.   No orders of the defined types were placed in this encounter.   Medications Discontinued During This Encounter  Medication Reason   gabapentin (NEURONTIN) 100 MG capsule Patient Preference   hydrOXYzine (ATARAX) 25 MG tablet Patient Preference     No orders of the defined types were placed in this encounter.     Labs reviewed today from last visit (CMP, Lipids, HgbA1c, insulin , vitamin D , CBC).   Objective:   VITALS: Per patient if applicable, see vitals. GENERAL: Alert and in no acute distress. CARDIOPULMONARY: No increased WOB. Speaking in clear sentences.  PSYCH: Pleasant and cooperative. Speech normal rate and rhythm. Affect is appropriate. Insight and judgement are appropriate. Attention is focused, linear, and appropriate.  NEURO: Oriented as arrived to appointment on time with no prompting.   Attestation Statements:    This was prepared with the assistance of Engineer, Civil (consulting).  Occasional wrong-word or sound-a-like substitutions may have occurred due to the inherent limitations of voice recognition software.

## 2023-10-01 ENCOUNTER — Telehealth (INDEPENDENT_AMBULATORY_CARE_PROVIDER_SITE_OTHER): Payer: Self-pay

## 2023-10-01 ENCOUNTER — Ambulatory Visit (INDEPENDENT_AMBULATORY_CARE_PROVIDER_SITE_OTHER): Payer: BC Managed Care – PPO | Admitting: Physician Assistant

## 2023-10-01 ENCOUNTER — Encounter (INDEPENDENT_AMBULATORY_CARE_PROVIDER_SITE_OTHER): Payer: Self-pay | Admitting: Physician Assistant

## 2023-10-01 VITALS — BP 108/74 | HR 75 | Temp 97.8°F | Ht 65.5 in | Wt 300.0 lb

## 2023-10-01 DIAGNOSIS — E7800 Pure hypercholesterolemia, unspecified: Secondary | ICD-10-CM | POA: Diagnosis not present

## 2023-10-01 DIAGNOSIS — R632 Polyphagia: Secondary | ICD-10-CM | POA: Diagnosis not present

## 2023-10-01 DIAGNOSIS — E282 Polycystic ovarian syndrome: Secondary | ICD-10-CM

## 2023-10-01 DIAGNOSIS — Z6841 Body Mass Index (BMI) 40.0 and over, adult: Secondary | ICD-10-CM

## 2023-10-01 DIAGNOSIS — E669 Obesity, unspecified: Secondary | ICD-10-CM

## 2023-10-01 DIAGNOSIS — E559 Vitamin D deficiency, unspecified: Secondary | ICD-10-CM

## 2023-10-01 DIAGNOSIS — E88819 Insulin resistance, unspecified: Secondary | ICD-10-CM | POA: Diagnosis not present

## 2023-10-01 MED ORDER — WEGOVY 0.25 MG/0.5ML ~~LOC~~ SOAJ: 0.2500 mg | SUBCUTANEOUS | 0 refills | Status: DC

## 2023-10-01 NOTE — Telephone Encounter (Signed)
PA started

## 2023-10-01 NOTE — Progress Notes (Signed)
SUBJECTIVE: Discussed the use of AI scribe software for clinical note transcription with the patient, who gave verbal consent to proceed.  Chief Complaint: Obesity  Interim History: She is up 1 lb since her last visit.  Tara Singleton, a 38 year old with a history of prediabetes/insulin resistance, hypercholesterolemia, polycystic ovary syndrome, and vitamin D deficiency, presents for a follow-up visit regarding her obesity treatment plan. She reports a previous trial of Zepbound for weight management, which was discontinued due to allergic reactions at the injection site. Despite these reactions, she experienced some weight loss during the treatment period.  The patient has a history of multiple allergies and carries EpiPens. She also reports constipation, for which she is taking a stool softener. The patient is currently not on any weight loss medications but is awaiting insurance authorization for [facility]. She expresses willingness to consider oral medications for weight management, despite acknowledging her potentially lower efficacy. She also reports a mild case of sleep apnea, but is not on CPAP therapy.  In terms of dietary habits, the patient is striving to meet a protein intake goal of 100 grams per day, but financial constraints and household circumstances are making this challenging. She reports that she is able to meet this goal about 50% of the time. She also notes that her hunger and cravings are not well controlled, particularly around the time of her menstrual cycle.  Tara Singleton is here to discuss her progress with her obesity treatment plan. She is on the Category 3 Plan and states she is following her eating plan approximately 70 % of the time. She states she is exercising 8K steps daily- 7 times per week.   OBJECTIVE: Visit Diagnoses: Problem List Items Addressed This Visit     Vitamin D deficiency   Insulin resistance   Other Visit Diagnoses       Elevated cholesterol     -  Primary     Polyphagia         Obesity (HCC)- Start BMI 49.16         Obesity with polyphagia Obesity management follow-up. Previous treatment with Zepbound was partially effective( thinks she lost 16 lbs over 3 months)  but caused significant allergic reactions. Plan to initiate [facility] pending prior authorization.Insulin resistance, hypercholesterolemia, PCOS support the need for [facility]. Patient lost approximately 16 pounds over 3-4 months on Zepbound before discontinuation due to allergic reactions. Discussed alternative medications like metformin and bupropion if [facility] is not approved. - Submit prior authorization for [employer] - Send prescription to [facility] in [place] - Advise antihistamine day before and day of injection, and apply cortisone to the injection site if needed - Recommend washing the injection site with soap and water after using an alcohol swab to prevent local reactions - Discuss importance of protein intake (100 grams/day) and hydration (80-100 ounces/day) - Provide recipes for high-protein, cost-effective meals - Encourage focus on protein and hydration, especially during stressful periods - Follow-up in four weeks  Allergic Reaction to Zepbound Significant allergic reactions to Zepbound, including large welts and severe itching at the injection site. Zepbound listed as an allergy. - Document Zepbound as an allergy   Insulin Resistance Lab Results  Component Value Date   HGBA1C 5.5 09/01/2023   HGBA1C 5.4 07/26/2022   Lab Results  Component Value Date   LDLCALC 177 (H) 09/01/2023   CREATININE 0.86 09/01/2023   INSULIN  Date Value Ref Range Status  09/01/2023 14.0 2.6 - 24.9 uIU/mL Final  insulin resistance. Discussed potential use  of metformin if [facility] is not approved. - Consider metformin if [facility] L[facility] is not approved Continue working on nutrition plan to decrease simple carbohydrates, increase lean proteins and exercise to promote weight loss, improve glycemic  control and prevent progression to Type 2 diabetes.   Hypercholesterolemia Lab Results  Component Value Date   LDLCALC 177 (H) 09/01/2023    Hypercholesterolemia. Supporting diagnosis for [facility] prior authorization. - Include hypercholesterolemia in [facility] prior authorization Continue to work on nutrition plan -decreasing simple carbohydrates, increasing lean proteins, decreasing saturated fats and cholesterol , avoiding trans fats and exercise as able to promote weight loss, improve lipids and decrease cardiovascular risks.  Polycystic Ovary Syndrome (PCOS) PCOS. Supporting diagnosis for [facility] prior authorization. - Include PCOS in [facility] prior authorization   General Health Maintenance Discussed importance of balanced diet with sufficient fiber and hydration. Encouraged regular physical activity. - Encourage high-fiber diet - Advise 80-100 ounces of fluid intake daily - Encourage regular physical activity, such as setting reminders to move  Follow-up - Schedule follow-up appointment in four weeks on February 19th at 8 AM.  Vitals Temp: 97.8 F (36.6 C) BP: 108/74 Pulse Rate: 75 SpO2: 97 %   Anthropometric Measurements Height: 5' 5.5" (1.664 m) Weight: 300 lb (136.1 kg) BMI (Calculated): 49.15 Weight at Last Visit: 299lb Weight Lost Since Last Visit: 0 Weight Gained Since Last Visit: 1lb Starting Weight: 300lb Total Weight Loss (lbs): 0 lb (0 kg) Peak Weight: 316lb   Body Composition  Body Fat %: 55.7 % Fat Mass (lbs): 167.4 lbs Muscle Mass (lbs): 126.4 lbs Visceral Fat Rating : 18   Other Clinical Data Fasting: no Labs: no Today's Visit #: 3 Starting Date: 09/01/23     ASSESSMENT AND PLAN:  Diet: Tara Singleton is currently in the action stage of change. As such, her goal is to continue with weight loss efforts. She has agreed to Category 3 Plan.  Exercise: Franka has been instructed to continue exercising as is for weight loss and overall health  benefits.   Behavior Modification:  We discussed the following Behavioral Modification Strategies today: increasing lean protein intake, decreasing simple carbohydrates, increasing vegetables, increase H2O intake, increase high fiber foods, no skipping meals, meal planning and cooking strategies, planning for success, and provided recipes for White chicken chili, Chicken enchiladas and chicken and bean soup . We discussed various medication options to help Shalla with her weight loss efforts and we both agreed to start [facility] for primary indication of hypercholesterolemia and PCOS as well as to promote medical weight loss.  Return in about 3 weeks (around 10/22/2023).Marland Kitchen She was informed of the importance of frequent follow up visits to maximize her success with intensive lifestyle modifications for her multiple health conditions.  Attestation Statements:   Reviewed by clinician on day of visit: allergies, medications, problem list, medical history, surgical history, family history, social history, and previous encounter notes.   Time spent on visit including pre-visit chart review and post-visit care and charting was 34 minutes.    Jovanie Verge, PA-C

## 2023-10-02 NOTE — Telephone Encounter (Signed)
Prior Authorization for [employer] from [employer] as below:  Your request has been denied Medical Denial  Other options can be discussed at the next visit.

## 2023-10-03 DIAGNOSIS — F330 Major depressive disorder, recurrent, mild: Secondary | ICD-10-CM | POA: Diagnosis not present

## 2023-10-05 ENCOUNTER — Encounter (INDEPENDENT_AMBULATORY_CARE_PROVIDER_SITE_OTHER): Payer: Self-pay | Admitting: Physician Assistant

## 2023-10-17 DIAGNOSIS — F330 Major depressive disorder, recurrent, mild: Secondary | ICD-10-CM | POA: Diagnosis not present

## 2023-10-29 ENCOUNTER — Ambulatory Visit (INDEPENDENT_AMBULATORY_CARE_PROVIDER_SITE_OTHER): Payer: BC Managed Care – PPO | Admitting: Physician Assistant

## 2023-10-31 DIAGNOSIS — J069 Acute upper respiratory infection, unspecified: Secondary | ICD-10-CM | POA: Diagnosis not present

## 2023-10-31 DIAGNOSIS — F330 Major depressive disorder, recurrent, mild: Secondary | ICD-10-CM | POA: Diagnosis not present

## 2023-10-31 DIAGNOSIS — R030 Elevated blood-pressure reading, without diagnosis of hypertension: Secondary | ICD-10-CM | POA: Diagnosis not present

## 2023-11-04 ENCOUNTER — Telehealth (INDEPENDENT_AMBULATORY_CARE_PROVIDER_SITE_OTHER): Payer: Self-pay | Admitting: *Deleted

## 2023-11-04 NOTE — Telephone Encounter (Signed)
  Authorization-[id number] request for the Wegovy 0.25 MG/0.5 ML [place] [facility] has been approved for 6 months. Called patient and updated thru voice message.

## 2023-11-05 DIAGNOSIS — R059 Cough, unspecified: Secondary | ICD-10-CM | POA: Diagnosis not present

## 2023-11-05 DIAGNOSIS — J189 Pneumonia, unspecified organism: Secondary | ICD-10-CM | POA: Diagnosis not present

## 2023-11-13 ENCOUNTER — Encounter (INDEPENDENT_AMBULATORY_CARE_PROVIDER_SITE_OTHER): Payer: Self-pay

## 2023-11-14 DIAGNOSIS — F330 Major depressive disorder, recurrent, mild: Secondary | ICD-10-CM | POA: Diagnosis not present

## 2023-11-28 DIAGNOSIS — F330 Major depressive disorder, recurrent, mild: Secondary | ICD-10-CM | POA: Diagnosis not present

## 2023-12-02 NOTE — Progress Notes (Unsigned)
   SUBJECTIVE: Discussed the use of AI scribe software for clinical note transcription with the patient, who gave verbal consent to proceed.  Chief Complaint: Obesity  Interim History: ***  Diahn is here to discuss her progress with her obesity treatment plan. She is on the {HWW Weight Loss Plan:210964005} and states she {CHL AMB IS/IS NOT:210130109} following her eating plan approximately *** % of the time. She states she {CHL AMB IS/IS NOT:210130109} exercising *** minutes *** times per week.   OBJECTIVE: Visit Diagnoses: Problem List Items Addressed This Visit     Hyperlipidemia - Primary   Insulin resistance   Other Visit Diagnoses       Polyphagia         Obesity (HCC)- Start BMI 49.16           No data recorded No data recorded No data recorded No data recorded   ASSESSMENT AND PLAN:  Diet: Hesper {CHL AMB IS/IS NOT:210130109} currently in the action stage of change. As such, her goal is to {HWW Weight Loss Efforts:210964006}. She {HAS HAS ZHY:86578} agreed to {HWW Weight Loss Plan:210964005}.  Exercise: Adline has been instructed {HWW Exercise:210964007} for weight loss and overall health benefits.   Behavior Modification:  We discussed the following Behavioral Modification Strategies today: {HWW Behavior Modification:210964008}. We discussed various medication options to help Ryian with her weight loss efforts and we both agreed to ***.  No follow-ups on file.Marland Kitchen She was informed of the importance of frequent follow up visits to maximize her success with intensive lifestyle modifications for her multiple health conditions.  Attestation Statements:   Reviewed by clinician on day of visit: allergies, medications, problem list, medical history, surgical history, family history, social history, and previous encounter notes.   Time spent on visit including pre-visit chart review and post-visit care and charting was *** minutes.    Nole Robey, PA-C

## 2023-12-04 ENCOUNTER — Ambulatory Visit (INDEPENDENT_AMBULATORY_CARE_PROVIDER_SITE_OTHER): Admitting: Physician Assistant

## 2023-12-04 DIAGNOSIS — R632 Polyphagia: Secondary | ICD-10-CM

## 2023-12-04 DIAGNOSIS — E7800 Pure hypercholesterolemia, unspecified: Secondary | ICD-10-CM

## 2023-12-04 DIAGNOSIS — E88819 Insulin resistance, unspecified: Secondary | ICD-10-CM

## 2023-12-12 DIAGNOSIS — F330 Major depressive disorder, recurrent, mild: Secondary | ICD-10-CM | POA: Diagnosis not present

## 2023-12-24 ENCOUNTER — Ambulatory Visit: Admitting: Family Medicine

## 2023-12-24 ENCOUNTER — Encounter: Payer: Self-pay | Admitting: Family Medicine

## 2023-12-24 ENCOUNTER — Other Ambulatory Visit: Payer: Self-pay | Admitting: Family Medicine

## 2023-12-24 VITALS — BP 122/84 | HR 80 | Temp 98.2°F | Ht 65.5 in | Wt 314.0 lb

## 2023-12-24 DIAGNOSIS — E559 Vitamin D deficiency, unspecified: Secondary | ICD-10-CM

## 2023-12-24 DIAGNOSIS — R202 Paresthesia of skin: Secondary | ICD-10-CM | POA: Diagnosis not present

## 2023-12-24 DIAGNOSIS — M7918 Myalgia, other site: Secondary | ICD-10-CM

## 2023-12-24 DIAGNOSIS — Z91018 Allergy to other foods: Secondary | ICD-10-CM

## 2023-12-24 DIAGNOSIS — R52 Pain, unspecified: Secondary | ICD-10-CM

## 2023-12-24 DIAGNOSIS — I10 Essential (primary) hypertension: Secondary | ICD-10-CM | POA: Diagnosis not present

## 2023-12-24 DIAGNOSIS — M25551 Pain in right hip: Secondary | ICD-10-CM

## 2023-12-24 DIAGNOSIS — M25562 Pain in left knee: Secondary | ICD-10-CM

## 2023-12-24 DIAGNOSIS — G8929 Other chronic pain: Secondary | ICD-10-CM

## 2023-12-24 DIAGNOSIS — M25649 Stiffness of unspecified hand, not elsewhere classified: Secondary | ICD-10-CM | POA: Diagnosis not present

## 2023-12-24 DIAGNOSIS — G4730 Sleep apnea, unspecified: Secondary | ICD-10-CM

## 2023-12-24 DIAGNOSIS — M25552 Pain in left hip: Secondary | ICD-10-CM

## 2023-12-24 DIAGNOSIS — M25561 Pain in right knee: Secondary | ICD-10-CM

## 2023-12-24 LAB — T4, FREE: Free T4: 0.71 ng/dL (ref 0.60–1.60)

## 2023-12-24 LAB — COMPREHENSIVE METABOLIC PANEL WITH GFR
ALT: 21 U/L (ref 0–35)
AST: 15 U/L (ref 0–37)
Albumin: 4.4 g/dL (ref 3.5–5.2)
Alkaline Phosphatase: 74 U/L (ref 39–117)
BUN: 17 mg/dL (ref 6–23)
CO2: 28 meq/L (ref 19–32)
Calcium: 9.5 mg/dL (ref 8.4–10.5)
Chloride: 101 meq/L (ref 96–112)
Creatinine, Ser: 0.77 mg/dL (ref 0.40–1.20)
GFR: 98.87 mL/min (ref >60.00)
Glucose, Bld: 87 mg/dL (ref 70–99)
Potassium: 4.1 meq/L (ref 3.5–5.1)
Sodium: 137 meq/L (ref 135–145)
Total Bilirubin: 0.3 mg/dL (ref 0.2–1.2)
Total Protein: 7.2 g/dL (ref 6.0–8.3)

## 2023-12-24 LAB — VITAMIN B12: Vitamin B-12: 526 pg/mL (ref 211–911)

## 2023-12-24 LAB — SEDIMENTATION RATE: Sed Rate: 30 mm/h — ABNORMAL HIGH (ref 0–20)

## 2023-12-24 LAB — C-REACTIVE PROTEIN: CRP: 1 mg/dL (ref 0.5–20.0)

## 2023-12-24 LAB — CBC WITH DIFFERENTIAL/PLATELET
Basophils Absolute: 0 10*3/uL (ref 0.0–0.1)
Basophils Relative: 0.7 % (ref 0.0–3.0)
Eosinophils Absolute: 0.1 10*3/uL (ref 0.0–0.7)
Eosinophils Relative: 1.5 % (ref 0.0–5.0)
HCT: 37.9 % (ref 36.0–46.0)
Hemoglobin: 12.8 g/dL (ref 12.0–15.0)
Lymphocytes Relative: 29.7 % (ref 12.0–46.0)
Lymphs Abs: 1.6 10*3/uL (ref 0.7–4.0)
MCHC: 33.7 g/dL (ref 30.0–36.0)
MCV: 90.7 fl (ref 78.0–100.0)
Monocytes Absolute: 0.3 10*3/uL (ref 0.1–1.0)
Monocytes Relative: 6.2 % (ref 3.0–12.0)
Neutro Abs: 3.4 10*3/uL (ref 1.4–7.7)
Neutrophils Relative %: 61.9 % (ref 43.0–77.0)
Platelets: 352 10*3/uL (ref 150.0–400.0)
RBC: 4.18 Mil/uL (ref 3.87–5.11)
RDW: 14.1 % (ref 11.5–15.5)
WBC: 5.5 10*3/uL (ref 4.0–10.5)

## 2023-12-24 LAB — FERRITIN: Ferritin: 23.5 ng/mL (ref 10.0–291.0)

## 2023-12-24 LAB — VITAMIN D 25 HYDROXY (VIT D DEFICIENCY, FRACTURES): VITD: 19.68 ng/mL — ABNORMAL LOW (ref 30.00–100.00)

## 2023-12-24 LAB — FOLATE: Folate: 23.1 ng/mL (ref >5.9)

## 2023-12-24 LAB — CK: Total CK: 56 U/L (ref 7–177)

## 2023-12-24 LAB — TSH: TSH: 2.4 u[IU]/mL (ref 0.35–5.50)

## 2023-12-24 LAB — MAGNESIUM: Magnesium: 1.8 mg/dL (ref 1.5–2.5)

## 2023-12-24 MED ORDER — KETOROLAC TROMETHAMINE 60 MG/2ML IM SOLN
60.0000 mg | Freq: Once | INTRAMUSCULAR | Status: AC
  Administered 2023-12-24: 60 mg via INTRAMUSCULAR

## 2023-12-24 MED ORDER — EPINEPHRINE 0.3 MG/0.3ML IJ SOAJ: 0.3000 mg | INTRAMUSCULAR | 0 refills | Status: AC | PRN

## 2023-12-24 MED ORDER — VITAMIN D (ERGOCALCIFEROL) 1.25 MG (50000 UNIT) PO CAPS: 50000.0000 [IU] | ORAL_CAPSULE | ORAL | 1 refills | Status: DC

## 2023-12-24 NOTE — Progress Notes (Signed)
 Subjective:     Patient ID: Tara Singleton, female    DOB: 1986-05-29, 38 y.o.   MRN: [id number]  Chief Complaint  Patient presents with   Pain    Wants to discuss getting back on gabapentin, does have nerve damage in left leg and overall pain, getting inflammation     HPI  History of Present Illness          Other providers:  Neurology- [facility] Dr Cathie Hoops Psych- Dr. Gaspar Skeeters  Therapist- [facility]  OB/GYN- [facility]  Orthopedist- in [place]    C/o  a new issue for the past month. "pain overall body". Feels like her body is "buzzing", vibratory sensation.  She denies having this issue before. States she cannot relax because "everything hurts". Having issues sleeping. Able to work and pain is not as severe as when she is at rest.  States even her cat laying on her is painful.   States she has joint pain and muscle aches. Hips and low back ache at times.   Hands and legs feel stiff in the morning.   Hx of endometriosis.   Denies tingling or weakness. States occasionally her feet fall asleep.   Started [place] 2 months ago. She is going to [employer].   Left knee arthritis. Right shoulder pain/injury but no issues now.   Sleep study showed mild OSA and she declined CPAP   States she is feeling more depressed due to her pain and symptoms.       12/24/2023    8:28 AM 06/13/2023    8:27 AM 03/14/2023    7:59 AM 09/04/2022    8:14 AM 07/26/2022    8:50 AM  Depression screen PHQ 2/9  Decreased Interest 2 1 0 0 1  Down, Depressed, Hopeless 2 1 1  0 1  PHQ - 2 Score 4 2 1  0 2  Altered sleeping 3 1  0 3  Tired, decreased energy 3 1  0 2  Change in appetite 2 0  0 2  Feeling bad or failure about yourself  2 0  0 0  Trouble concentrating 1 1  0 3  Moving slowly or fidgety/restless 1 0  0 0  Suicidal thoughts 0 0  0 0  PHQ-9 Score 16 5  0 12  Difficult doing work/chores  Somewhat difficult  Not difficult at all Somewhat difficult      Health Maintenance Due   Topic Date Due   HIV Screening  Never done   Hepatitis C Screening  Never done   Pneumococcal Vaccine 54-54 Years old (2 of 2 - PCV) 10/10/2017    Past Medical History:  Diagnosis Date   Anxiety    Asthma    B12 deficiency    Constipation    Depression    Endometriosis    GERD (gastroesophageal reflux disease)    Headache    Infertility, female    PCOS (polycystic ovarian syndrome)    Sleep apnea    Swelling of lower extremity     Past Surgical History:  Procedure Laterality Date   EXTRACORPOREAL SHOCK WAVE LITHOTRIPSY Left 01/28/2022   Procedure: EXTRACORPOREAL SHOCK WAVE LITHOTRIPSY (ESWL);  Surgeon: Heloise Purpura, MD;  Location: [facility];  Service: Urology;  Laterality: Left;    Family History  Problem Relation Age of Onset   High blood pressure Mother    High Cholesterol Mother    Stroke Mother    Thyroid disease Mother  Cancer Mother    Depression Mother    Anxiety disorder Mother    Obesity Mother    Asthma Father    Anxiety disorder Father    Sleep apnea Father    Alcoholism Father    Drug abuse Father    Obesity Father     Social History   Socioeconomic History   Marital status: Married    Spouse name: Not on file   Number of children: Not on file   Years of education: Not on file   Highest education level: Some college, no degree  Occupational History   Not on file  Tobacco Use   Smoking status: Never   Smokeless tobacco: Never  Substance and Sexual Activity   Alcohol use: Never   Drug use: Never   Sexual activity: Not on file  Other Topics Concern   Not on file  Social History Narrative   Not on file   Social Drivers of Health   Financial Resource Strain: Low Risk  (09/11/2023)   Overall Financial Resource Strain (CARDIA)    Difficulty of Paying Living Expenses: Not very hard  Food Insecurity: Food Insecurity Present (09/11/2023)   Hunger Vital Sign    Worried About Running Out of Food in the Last Year: Sometimes  true    Ran Out of Food in the Last Year: Never true  Transportation Needs: No Transportation Needs (09/11/2023)   PRAPARE - Administrator, Civil Service (Medical): No    Lack of Transportation (Non-Medical): No  Physical Activity: Insufficiently Active (09/11/2023)   Exercise Vital Sign    Days of Exercise per Week: 2 days    Minutes of Exercise per Session: 20 min  Stress: Stress Concern Present (09/11/2023)   [employer] of Occupational Health - Occupational Stress Questionnaire    Feeling of Stress : To some extent  Social Connections: Moderately Isolated (09/11/2023)   Social Connection and Isolation Panel [NHANES]    Frequency of Communication with Friends and Family: More than three times a week    Frequency of Social Gatherings with Friends and Family: More than three times a week    Attends Religious Services: Never    Database administrator or Organizations: No    Attends Engineer, structural: Not on file    Marital Status: Married  Catering manager Violence: Not At Risk (10/31/2022)   [facility]   HITS    Over the last 12 months how often did your partner physically hurt you?: Never    Over the last 12 months how often did your partner insult you or talk down to you?: Never    Over the last 12 months how often did your partner threaten you with physical harm?: Never    Over the last 12 months how often did your partner scream or curse at you?: Never    Outpatient Medications Prior to Visit  Medication Sig Dispense Refill   ABILIFY 5 MG tablet      ALPRAZolam (XANAX) 0.25 MG tablet Take 1 tablet (0.25 mg total) by mouth 2 (two) times daily as needed for anxiety. 20 tablet 0   cetirizine (ZYRTEC) 10 MG tablet Take by mouth.     cholecalciferol (VITAMIN D3) 25 MCG (1000 UNIT) tablet Take 1,000 Units by mouth daily.     FLUoxetine (PROZAC) 40 MG capsule Take 40 mg by mouth daily.     mometasone (NASONEX) 50 MCG/ACT nasal  spray Place 2  sprays into the nose daily.     naproxen (NAPROSYN) 500 MG tablet Take 1 tablet (500 mg total) by mouth 2 (two) times daily. 30 tablet 0   ondansetron (ZOFRAN-ODT) 4 MG disintegrating tablet Take 4 mg by mouth every 12 (twelve) hours.     PROAIR HFA 108 (90 Base) MCG/ACT inhaler Inhale into the lungs.     rizatriptan (MAXALT) 10 MG tablet Take by mouth.     Semaglutide-Weight Management (WEGOVY) 0.25 MG/0.5ML SOAJ Inject 0.25 mg into the skin once a week. 2 mL 0   verapamil (CALAN-SR) 120 MG CR tablet Take 120 mg by mouth at bedtime.     No facility-administered medications prior to visit.    Allergies  Allergen Reactions   Avocado Hives, Nausea And Vomiting, Rash, Shortness Of Breath and Swelling   Betula Alba Oil Anaphylaxis   Kiwi Extract Dermatitis, Hives, Rash and Shortness Of Breath   Shellfish Allergy Anaphylaxis   Sulfa Antibiotics Hives, Other (See Comments) and Rash    Upset stomach    White Birch Anaphylaxis    Birch trees   Latex Hives   Sulfur Other (See Comments)    Welts   Avocado Extract Allergy Skin Test Hives   Macrobid [Nitrofurantoin] Hives   Mango Flavoring Agent (Non-Screening) Hives    Review of Systems  Constitutional:  Negative for chills, fever and weight loss.  Eyes:  Negative for blurred vision and double vision.  Respiratory:  Negative for shortness of breath.   Cardiovascular:  Negative for chest pain, palpitations and leg swelling.  Gastrointestinal:  Negative for abdominal pain, constipation, diarrhea, nausea and vomiting.  Genitourinary:  Negative for dysuria, frequency and urgency.  Musculoskeletal:  Positive for back pain, joint pain and myalgias.  Skin:  Negative for rash.  Neurological:  Positive for tingling and headaches. Negative for dizziness and focal weakness.       Headaches are not new or changed   Psychiatric/Behavioral:  Positive for depression. Negative for suicidal ideas. The patient has insomnia.         Objective:    Physical Exam Constitutional:      General: She is not in acute distress.    Appearance: She is not ill-appearing.  HENT:     Mouth/Throat:     Mouth: Mucous membranes are moist.  Eyes:     Extraocular Movements: Extraocular movements intact.     Conjunctiva/sclera: Conjunctivae normal.  Cardiovascular:     Rate and Rhythm: Normal rate and regular rhythm.  Pulmonary:     Effort: Pulmonary effort is normal.     Breath sounds: Normal breath sounds.  Musculoskeletal:     Right wrist: No swelling or tenderness. Normal range of motion. Normal pulse.     Left wrist: No swelling or tenderness. Normal range of motion. Normal pulse.     Right hand: No swelling or tenderness. Normal strength. Normal sensation. Normal capillary refill. Normal pulse.     Left hand: No swelling or tenderness. Normal strength. Normal sensation. Normal capillary refill. Normal pulse.     Cervical back: Normal range of motion and neck supple.  Skin:    General: Skin is warm and dry.  Neurological:     General: No focal deficit present.     Mental Status: She is alert and oriented to person, place, and time.     Cranial Nerves: No cranial nerve deficit.     Motor: No weakness.     Coordination: Coordination normal.  Gait: Gait normal.  Psychiatric:        Mood and Affect: Mood normal.        Behavior: Behavior normal.        Thought Content: Thought content normal.      BP 122/84 (BP Location: Left Arm, Patient Position: Sitting)   Pulse 80   Temp 98.2 F (36.8 C) (Temporal)   Ht 5' 5.5" (1.664 m)   Wt (!) 314 lb (142.4 kg)   SpO2 97%   BMI 51.46 kg/m  Wt Readings from Last 3 Encounters:  12/24/23 (!) 314 lb (142.4 kg)  10/01/23 300 lb (136.1 kg)  09/22/23 299 lb (135.6 kg)       Assessment & Plan:   Problem List Items Addressed This Visit     Essential hypertension   Relevant Medications   EPINEPHrine 0.3 mg/0.3 mL IJ SOAJ injection   Other Relevant Orders   CBC with  Differential/Platelet   Comprehensive metabolic panel with GFR   Mild sleep apnea   Severe obesity (BMI >= 40) (HCC)   Vitamin D deficiency   Relevant Orders   VITAMIN D 25 Hydroxy (Vit-D Deficiency, Fractures)   Other Visit Diagnoses       Myalgia, multiple sites    -  Primary   Relevant Medications   ketorolac (TORADOL) injection 60 mg (Completed) (Start on 12/24/2023  9:30 AM)   Other Relevant Orders   CBC with Differential/Platelet   Comprehensive metabolic panel with GFR   Ferritin   Folate   Magnesium   Vitamin B12   TSH   T4, free   CK (Creatine Kinase)   ANA   C-reactive protein   Sedimentation rate   Rheumatoid factor     Pain       Relevant Medications   ketorolac (TORADOL) injection 60 mg (Completed) (Start on 12/24/2023  9:30 AM)   Other Relevant Orders   ANA     Paresthesias       Relevant Orders   CBC with Differential/Platelet   Comprehensive metabolic panel with GFR   Ferritin   Folate   Magnesium   Vitamin B12   TSH   T4, free   ANA   C-reactive protein   Sedimentation rate     Food allergy         Chronic arthralgias of knees and hips       Relevant Medications   ketorolac (TORADOL) injection 60 mg (Completed) (Start on 12/24/2023  9:30 AM)   Other Relevant Orders   ANA   C-reactive protein   Sedimentation rate   Rheumatoid factor     Morning joint stiffness of hand, unspecified laterality       Relevant Medications   ketorolac (TORADOL) injection 60 mg (Completed) (Start on 12/24/2023  9:30 AM)   Other Relevant Orders   ANA   C-reactive protein   Sedimentation rate   Rheumatoid factor       Here with complaints of a 4-week history of myalgias and arthralgias.  This is new.  History of arthritis in her knee and shoulder injury but she has never dealt with pain all over in addition to a vibratory sensation at rest.  She is losing sleep and feeling more depressed due to the pain.  Check labs including autoimmune workup.  Look for  deficiencies. Toradol 60 mg IM given in office today.  She will report back via MyChart if the shot was effective or not. Consider treatment with short course  of steroids versus gabapentin.  She reports taking gabapentin in the past and it was effective for shoulder pain at that time. Refilled EpiPen's due to allergies.  Her previous EpiPen's have expired. She is working with [facility] for weight loss and recently started [employer].  Apparently the pain started prior to [facility].  Consider as a underlying etiology. Continue follow-up with psychiatrist and therapist. I will see her back in 4 weeks for fasting labs.  Previous LDL 177.   I am having Jakelin Karasik "Vinny" start on EPINEPHrine. I am also having her maintain her ProAir HFA, cetirizine, verapamil, rizatriptan, cholecalciferol, FLUoxetine, ondansetron, mometasone, Abilify, ALPRAZolam, naproxen, and Wegovy. We administered ketorolac.  Meds ordered this encounter  Medications   EPINEPHrine 0.3 mg/0.3 mL IJ SOAJ injection    Sig: Inject 0.3 mg into the muscle as needed for anaphylaxis.    Dispense:  1 each    Refill:  0    Supervising Provider:   Bambi Lever A [[id number]]   ketorolac (TORADOL) injection 60 mg

## 2023-12-24 NOTE — Telephone Encounter (Signed)
 fyi

## 2023-12-24 NOTE — Patient Instructions (Addendum)
 You received a Toradol 60 mg IM injection in our office today.  This is for inflammation and pain.  Please let me know tomorrow how the effect of the shot was for your pain.  Will be in touch with your lab results.  I would like to see you back in 4 weeks fasting to recheck your cholesterol.  Nothing to eat or drink except water for at least 8 hours.

## 2023-12-25 ENCOUNTER — Ambulatory Visit (INDEPENDENT_AMBULATORY_CARE_PROVIDER_SITE_OTHER): Admitting: Family Medicine

## 2023-12-25 VITALS — BP 136/85 | HR 89 | Temp 98.2°F | Ht 65.5 in | Wt 313.0 lb

## 2023-12-25 DIAGNOSIS — Z6841 Body Mass Index (BMI) 40.0 and over, adult: Secondary | ICD-10-CM

## 2023-12-25 DIAGNOSIS — E559 Vitamin D deficiency, unspecified: Secondary | ICD-10-CM

## 2023-12-25 DIAGNOSIS — R632 Polyphagia: Secondary | ICD-10-CM

## 2023-12-25 DIAGNOSIS — E669 Obesity, unspecified: Secondary | ICD-10-CM

## 2023-12-25 LAB — RHEUMATOID FACTOR: Rheumatoid fact SerPl-aCnc: <10 [IU]/mL (ref <14)

## 2023-12-25 LAB — ANA: Anti Nuclear Antibody (ANA): NEGATIVE

## 2023-12-25 MED ORDER — VITAMIN D (ERGOCALCIFEROL) 1.25 MG (50000 UNIT) PO CAPS: 50000.0000 [IU] | ORAL_CAPSULE | ORAL | 1 refills | Status: DC

## 2023-12-25 MED ORDER — WEGOVY 0.5 MG/0.5ML ~~LOC~~ SOAJ: 0.5000 mg | SUBCUTANEOUS | 0 refills | Status: DC

## 2023-12-25 NOTE — Progress Notes (Signed)
 Office: [phone]  /  Fax: [phone]  WEIGHT SUMMARY AND BIOMETRICS  Anthropometric Measurements Height: 5' 5.5" (1.664 m) Weight: (!) 313 lb (142 kg) BMI (Calculated): 51.28 Weight at Last Visit: 314lb Weight Lost Since Last Visit: 0lb Weight Gained Since Last Visit: 13lb Starting Weight: 300lb Total Weight Loss (lbs): 0 lb (0 kg)   Body Composition  Body Fat %: 58.3 % Fat Mass (lbs): 182.4 lbs Muscle Mass (lbs): 124 lbs Visceral Fat Rating : 20   Other Clinical Data Fasting: No Labs: No Today's Visit #: 4 Starting Date: 09/01/23    Chief Complaint: OBESITY   History of Present Illness Tara Singleton "Colbert Coyer" is a 38 year old female with obesity who presents for a follow-up on her treatment plan.  She has difficulty adhering to her category three plan, managing to follow it only about 25% of the time. Despite efforts to increase her physical activity, averaging approximately 7,000 steps five days a week, she has gained 13 pounds since her last visit three months ago.  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 She attempts to eat healthily, focusing on high protein intake, but finds it challenging due to her unpredictable schedule and frequent travel.  Her current diet includes protein-rich foods such as chicken and a specific milk product providing 30 grams of protein per serving. She is mindful of her protein intake, aiming for high protein consumption to support her weight management goals.  She is currently on Wegovy 0.25 mg for weight management and reports no gastrointestinal issues or constipation, as she regularly takes a stool softener. She has been on this dose for two months.  She has a history of vitamin D deficiency, with a recent level of 19 ng/mL, and has been  prescribed a high-dose vitamin D supplement, 50,000 IU weekly, to address this deficiency. She has discontinued her over-the-counter vitamin D supplement in favor of the prescription.      PHYSICAL EXAM:  Blood pressure 136/85, pulse 89, temperature 98.2 F (36.8 C), height 5' 5.5" (1.664 m), weight (!) 313 lb (142 kg), SpO2 98%. Body mass index is 51.29 kg/m.  DIAGNOSTIC DATA REVIEWED:  BMET    Component Value Date/Time   NA 137 12/24/2023 0930   NA 140 09/01/2023 1036   K 4.1 12/24/2023 0930   CL 101 12/24/2023 0930   CO2 28 12/24/2023 0930   GLUCOSE 87 12/24/2023 0930   BUN 17 12/24/2023 0930   BUN 12 09/01/2023 1036   CREATININE 0.77 12/24/2023 0930   CALCIUM 9.5 12/24/2023 0930   GFRNONAA >60 02/27/2022 1115   Lab Results  Component Value Date   HGBA1C 5.5 09/01/2023   HGBA1C 5.4 07/26/2022   Lab Results  Component Value Date   INSULIN 14.0 09/01/2023   Lab Results  Component Value Date   TSH 2.40 12/24/2023   CBC    Component Value Date/Time   WBC 5.5 12/24/2023 0930   RBC 4.18 12/24/2023 0930   HGB 12.8 12/24/2023 0930   HCT 37.9 12/24/2023 0930   PLT 352.0 12/24/2023 0930   MCV 90.7 12/24/2023 0930   MCH 30.1 02/27/2022 1000   MCHC 33.7 12/24/2023 0930   RDW 14.1 12/24/2023 0930   Iron Studies    Component Value Date/Time   IRON 107 03/14/2023 0834   TIBC 370 03/14/2023 0834   FERRITIN  23.5 12/24/2023 0930   IRONPCTSAT 29 03/14/2023 0834   Lipid Panel     Component Value Date/Time   CHOL 252 (H) 09/01/2023 1036   TRIG 104 09/01/2023 1036   HDL 57 09/01/2023 1036   CHOLHDL 4 03/14/2023 0834   VLDL 18.6 03/14/2023 0834   LDLCALC 177 (H) 09/01/2023 1036   Hepatic Function Panel     Component Value Date/Time   PROT 7.2 12/24/2023 0930   PROT 6.9 09/01/2023 1036   ALBUMIN 4.4 12/24/2023 0930   ALBUMIN 4.4 09/01/2023 1036   AST 15 12/24/2023 0930   ALT 21 12/24/2023 0930   ALKPHOS 74 12/24/2023 0930   BILITOT 0.3 12/24/2023 0930    BILITOT 0.2 09/01/2023 1036      Component Value Date/Time   TSH 2.40 12/24/2023 0930   Nutritional Lab Results  Component Value Date   VD25OH 19.68 (L) 12/24/2023   VD25OH 32.5 09/01/2023   VD25OH 49.63 07/26/2022     Assessment and Plan Assessment & Plan Obesity She has adhered to her previous weight management plan only 25% of the time, resulting in a 13-pound weight gain over the past three months. Her lifestyle, involving significant travel and irregular work hours, complicates maintaining a structured eating and exercise routine. A new weight management approach was discussed, focusing on a sustainable caloric deficit and adequate protein intake to prevent muscle loss and metabolic slowdown. Her resting metabolic rate is 1610 calories, and a caloric intake of 1500 calories is recommended to achieve a weight loss of approximately one pound per week, with a minimum intake of 1400 calories to avoid metabolic slowdown. - Implement a journaling eating plan with a caloric intake of 1400 to 1700 calories per day and at least 100 grams of protein. - Provide handouts on food choices, including options for dining out. - Encourage the use of MyFitnessPal for tracking food intake. - Advise against starting any new exercise regimen; continue current activity level. - Schedule follow-up in 2-3 weeks.  Polyphagia She experiences excessive hunger, managed with Wegovy. She has been on the starter dose of 0.25 mg for two months without gastrointestinal issues. The dose will be increased to 0.5 mg to enhance the therapeutic effect. Monthly follow-up is necessary to monitor effectiveness and adjust treatment as needed. - Increase Wegovy dose from 0.25 mg to 0.5 mg. - Ensure monthly follow-up to monitor the effectiveness and adjust treatment as necessary.  Vitamin D deficiency She has low vitamin D levels, with a recent measurement of 19 ng/mL, and experiences pain potentially related to the  deficiency. A high-dose vitamin D supplement has been prescribed. - Continue prescription vitamin D at 50,000 IU per week. - Recheck vitamin D levels in 2 months.      She was informed of the importance of frequent follow up visits to maximize her success with intensive lifestyle modifications for her multiple health conditions.    Jasmine Mesi, MD

## 2024-01-09 DIAGNOSIS — F330 Major depressive disorder, recurrent, mild: Secondary | ICD-10-CM | POA: Diagnosis not present

## 2024-01-16 ENCOUNTER — Encounter (HOSPITAL_COMMUNITY): Payer: Self-pay

## 2024-01-16 ENCOUNTER — Other Ambulatory Visit: Payer: Self-pay | Admitting: Medical Genetics

## 2024-01-19 ENCOUNTER — Other Ambulatory Visit (INDEPENDENT_AMBULATORY_CARE_PROVIDER_SITE_OTHER): Payer: Self-pay | Admitting: Family Medicine

## 2024-01-19 DIAGNOSIS — R632 Polyphagia: Secondary | ICD-10-CM

## 2024-01-21 ENCOUNTER — Encounter: Payer: Self-pay | Admitting: Family Medicine

## 2024-01-23 ENCOUNTER — Encounter: Payer: Self-pay | Admitting: Family Medicine

## 2024-01-23 ENCOUNTER — Ambulatory Visit: Admitting: Family Medicine

## 2024-01-23 ENCOUNTER — Other Ambulatory Visit (HOSPITAL_COMMUNITY)
Admission: RE | Admit: 2024-01-23 | Discharge: 2024-01-23 | Disposition: A | Discharge status: Home / Self Care | Payer: Self-pay | Source: Ambulatory Visit | Attending: Medical Genetics | Admitting: Medical Genetics

## 2024-01-23 VITALS — BP 112/82 | HR 94 | Temp 97.6°F | Ht 65.5 in | Wt 311.0 lb

## 2024-01-23 DIAGNOSIS — F330 Major depressive disorder, recurrent, mild: Secondary | ICD-10-CM | POA: Diagnosis not present

## 2024-01-23 DIAGNOSIS — E7800 Pure hypercholesterolemia, unspecified: Secondary | ICD-10-CM

## 2024-01-23 LAB — LIPID PANEL
Cholesterol: 229 mg/dL — ABNORMAL HIGH (ref 0–200)
HDL: 47.4 mg/dL (ref >39.00)
LDL Cholesterol: 163 mg/dL — ABNORMAL HIGH (ref 0–99)
NonHDL: 181.39
Total CHOL/HDL Ratio: 5
Triglycerides: 91 mg/dL (ref 0.0–149.0)
VLDL: 18.2 mg/dL (ref 0.0–40.0)

## 2024-01-23 NOTE — Progress Notes (Signed)
 Subjective:     Patient ID: Tara Singleton, female    DOB: Jan 12, 1986, 38 y.o.   MRN: [id number]  Chief Complaint  Patient presents with   Medical Management of Chronic Issues    4 week f/u, had only an energy drink today.  Wants to discuss IBS-C diagnosis as sx relate to her    HPI  History of Present Illness         She is here for fasting follow-up.  History of elevated LDL. When this provider walked into the room, patient reported being late for a virtual appointment and would like to return on her fasting labs and to discuss possible IBS.     Health Maintenance Due  Topic Date Due   HIV Screening  Never done   Hepatitis C Screening  Never done   Pneumococcal Vaccine 30-46 Years old (2 of 2 - PCV) 10/10/2017    Past Medical History:  Diagnosis Date   Anxiety    Asthma    B12 deficiency    Constipation    Depression    Endometriosis    GERD (gastroesophageal reflux disease)    Headache    Infertility, female    PCOS (polycystic ovarian syndrome)    Sleep apnea    Swelling of lower extremity     Past Surgical History:  Procedure Laterality Date   EXTRACORPOREAL SHOCK WAVE LITHOTRIPSY Left 01/28/2022   Procedure: EXTRACORPOREAL SHOCK WAVE LITHOTRIPSY (ESWL);  Surgeon: Florencio Hunting, MD;  Location: [facility];  Service: Urology;  Laterality: Left;    Family History  Problem Relation Age of Onset   High blood pressure Mother    High Cholesterol Mother    Stroke Mother    Thyroid disease Mother    Cancer Mother    Depression Mother    Anxiety disorder Mother    Obesity Mother    Asthma Father    Anxiety disorder Father    Sleep apnea Father    Alcoholism Father    Drug abuse Father    Obesity Father     Social History   Socioeconomic History   Marital status: Married    Spouse name: Not on file   Number of children: Not on file   Years of education: Not on file   Highest education level: Some college, no degree   Occupational History   Not on file  Tobacco Use   Smoking status: Never   Smokeless tobacco: Never  Substance and Sexual Activity   Alcohol use: Never   Drug use: Never   Sexual activity: Not on file  Other Topics Concern   Not on file  Social History Narrative   Not on file   Social Drivers of Health   Financial Resource Strain: Low Risk  (09/11/2023)   Overall Financial Resource Strain (CARDIA)    Difficulty of Paying Living Expenses: Not very hard  Food Insecurity: Food Insecurity Present (09/11/2023)   Hunger Vital Sign    Worried About Running Out of Food in the Last Year: Sometimes true    Ran Out of Food in the Last Year: Never true  Transportation Needs: No Transportation Needs (09/11/2023)   PRAPARE - Administrator, Civil Service (Medical): No    Lack of Transportation (Non-Medical): No  Physical Activity: Insufficiently Active (09/11/2023)   Exercise Vital Sign    Days of Exercise per Week: 2 days    Minutes of Exercise per Session: 20 min  Stress: Stress  Concern Present (09/11/2023)   [employer] of Occupational Health - Occupational Stress Questionnaire    Feeling of Stress : To some extent  Social Connections: Moderately Isolated (09/11/2023)   Social Connection and Isolation Panel [NHANES]    Frequency of Communication with Friends and Family: More than three times a week    Frequency of Social Gatherings with Friends and Family: More than three times a week    Attends Religious Services: Never    Database administrator or Organizations: No    Attends Engineer, structural: Not on file    Marital Status: Married  Catering manager Violence: Not At Risk (10/31/2022)   Received from [facility]   HITS    Over the last 12 months how often did your partner physically hurt you?: Never    Over the last 12 months how often did your partner insult you or talk down to you?: Never    Over the last 12 months how often did your partner  threaten you with physical harm?: Never    Over the last 12 months how often did your partner scream or curse at you?: Never    Outpatient Medications Prior to Visit  Medication Sig Dispense Refill   ABILIFY 5 MG tablet      ALPRAZolam  (XANAX ) 0.25 MG tablet Take 1 tablet (0.25 mg total) by mouth 2 (two) times daily as needed for anxiety. 20 tablet 0   cetirizine (ZYRTEC) 10 MG tablet Take by mouth.     cholecalciferol (VITAMIN D3) 25 MCG (1000 UNIT) tablet Take 1,000 Units by mouth daily.     EPINEPHrine  0.3 mg/0.3 mL IJ SOAJ injection Inject 0.3 mg into the muscle as needed for anaphylaxis. 1 each 0   FLUoxetine (PROZAC) 40 MG capsule Take 40 mg by mouth daily.     mometasone (NASONEX) 50 MCG/ACT nasal spray Place 2 sprays into the nose daily.     ondansetron  (ZOFRAN -ODT) 4 MG disintegrating tablet Take 4 mg by mouth every 12 (twelve) hours.     PROAIR HFA 108 (90 Base) MCG/ACT inhaler Inhale into the lungs.     rizatriptan (MAXALT) 10 MG tablet Take by mouth.     Semaglutide -Weight Management (WEGOVY ) 0.5 MG/0.5ML SOAJ Inject 0.5 mg into the skin once a week. 2 mL 0   verapamil (CALAN-SR) 120 MG CR tablet Take 120 mg by mouth at bedtime.     Vitamin D , Ergocalciferol , (DRISDOL ) 1.25 MG (50000 UNIT) CAPS capsule Take 1 capsule (50,000 Units total) by mouth every 7 (seven) days. 6 capsule 1   naproxen  (NAPROSYN ) 500 MG tablet Take 1 tablet (500 mg total) by mouth 2 (two) times daily. (Patient not taking: Reported on 01/23/2024) 30 tablet 0   No facility-administered medications prior to visit.    Allergies  Allergen Reactions   Avocado Hives, Nausea And Vomiting, Rash, Shortness Of Breath and Swelling   Betula Alba Oil Anaphylaxis   Kiwi Extract Dermatitis, Hives, Rash and Shortness Of Breath   Shellfish Allergy Anaphylaxis   Sulfa Antibiotics Hives, Other (See Comments) and Rash    Upset stomach    White Birch Anaphylaxis    Birch trees   Latex Hives   Sulfur Other (See Comments)     Welts   Avocado Extract Allergy Skin Test Hives   Macrobid [Nitrofurantoin] Hives   Mango Flavoring Agent (Non-Screening) Hives    ROS     Objective:     Physical Exam   BP  112/82 (BP Location: Left Arm, Patient Position: Sitting)   Pulse 94   Temp 97.6 F (36.4 C) (Temporal)   Ht 5' 5.5" (1.664 m)   Wt (!) 311 lb (141.1 kg)   SpO2 96%   BMI 50.97 kg/m  Wt Readings from Last 3 Encounters:  01/23/24 (!) 311 lb (141.1 kg)  12/25/23 (!) 313 lb (142 kg)  12/24/23 (!) 314 lb (142.4 kg)       Assessment & Plan:   Problem List Items Addressed This Visit     Hyperlipidemia - Primary   Relevant Orders   Lipid panel   Patient will return for fasting labs due to history of hyperlipidemia.  Visit was interrupted and not completed due to patient having another visit scheduled and needed to leave.  I am having Tara Singleton "Vinny" maintain her ProAir HFA, cetirizine, verapamil, rizatriptan, cholecalciferol, FLUoxetine, ondansetron , mometasone, Abilify, ALPRAZolam , naproxen , EPINEPHrine , Wegovy , and Vitamin D  (Ergocalciferol ).  No orders of the defined types were placed in this encounter.

## 2024-01-26 ENCOUNTER — Ambulatory Visit: Payer: Self-pay | Admitting: Family Medicine

## 2024-01-27 ENCOUNTER — Ambulatory Visit (INDEPENDENT_AMBULATORY_CARE_PROVIDER_SITE_OTHER): Admitting: Family Medicine

## 2024-01-27 ENCOUNTER — Encounter (INDEPENDENT_AMBULATORY_CARE_PROVIDER_SITE_OTHER): Payer: Self-pay | Admitting: Family Medicine

## 2024-01-27 VITALS — BP 110/72 | HR 79 | Temp 97.9°F | Ht 65.5 in | Wt 304.0 lb

## 2024-01-27 DIAGNOSIS — F419 Anxiety disorder, unspecified: Secondary | ICD-10-CM

## 2024-01-27 DIAGNOSIS — R632 Polyphagia: Secondary | ICD-10-CM

## 2024-01-27 DIAGNOSIS — F32A Depression, unspecified: Secondary | ICD-10-CM

## 2024-01-27 DIAGNOSIS — Z6841 Body Mass Index (BMI) 40.0 and over, adult: Secondary | ICD-10-CM

## 2024-01-27 MED ORDER — WEGOVY 0.5 MG/0.5ML ~~LOC~~ SOAJ: 0.5000 mg | SUBCUTANEOUS | 0 refills | Status: DC

## 2024-01-27 NOTE — Assessment & Plan Note (Signed)
 Has psychiatrist that monitors medication and response.  Feels well managed at this time.  No SI or HI.  Continue current treatment plan at this time.

## 2024-01-27 NOTE — Progress Notes (Unsigned)
 SUBJECTIVE:  Chief Complaint: Obesity  Interim History: Patient here for follow up, she went to [place]  to visit family and relaxed.  Planning to work for [employer] Day weekend.  She has a variable schedule and location so her life is not as consistent as it could be.  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  Has been using My Fitness Pal and has been keeping track on her calculator and adding it up daily.  Getting about 45-60 grams of protein in daily.   Tara Singleton is here to discuss her progress with her obesity treatment plan. She is on the keeping a food journal and adhering to recommended goals of 1400-1700 calories and 100 grams of protein and states she is following her eating plan approximately 90 % of the time. She states she is walking 7,000-10,000 steps 5 times per week.   OBJECTIVE: Visit Diagnoses: Problem List Items Addressed This Visit       Other   Morbid obesity (HCC)   Anthropometric Measurements Height: 5' 5.5" (1.664 m) Weight: (!) 304 lb (137.9 kg) BMI (Calculated): 49.8 Weight at Last Visit: 313 lb Weight Lost Since Last Visit: 9 Weight Gained Since Last Visit: 0 Starting Weight: 300 lb Total Weight Loss (lbs): 0 lb (0 kg) Body Composition  Body Fat %: 56.2 % Fat Mass (lbs): 171 lbs Muscle Mass (lbs): 126.6 lbs Visceral Fat Rating : 19 Other Clinical Data Fasting: no Labs: no Today's Visit #: 5 Starting Date: 09/01/23 Comments: 1400-1700/100       Relevant Medications   Semaglutide -Weight Management (WEGOVY ) 0.5 MG/0.5ML SOAJ   Anxiety and depression - Primary   Has psychiatrist that monitors medication and response.  Feels well managed at this time.  No SI or HI.  Continue current treatment plan at this time.      Polyphagia   Patient is doing well on Wegovy  with no GI side effects.  She needs a refill today.  Will continue on current dose as patient is getting good satiety and still able to get total  quantity of macronutrients and calories in.      Relevant Medications   Semaglutide -Weight Management (WEGOVY ) 0.5 MG/0.5ML SOAJ   Other Visit Diagnoses       BMI 45.0-49.9, adult (HCC) Current BMI 49.3       Relevant Medications   Semaglutide -Weight Management (WEGOVY ) 0.5 MG/0.5ML SOAJ       No data recorded       01/27/2024   10:00 AM 01/23/2024    8:34 AM 12/25/2023    3:00 PM  Vitals with BMI  Height 5' 5.5" 5' 5.5" 5' 5.5"  Weight 304 lbs 311 lbs 313 lbs  BMI 49.8 50.95 51.28  Systolic 110 112 098  Diastolic 72 82 85  Pulse 79 94 89      ASSESSMENT AND PLAN:  Diet: Tara Singleton is currently in the action stage of change. As such, her goal is to continue with weight loss efforts and has agreed to keeping a food journal and adhering to recommended goals of 1400-1700 calories and 100 or more grams of protein. Patient to start food log or journaling meal plan.  The initial goal will be to habitually log or journal for at least 4 days a week.  The expectation it that patient may not initially meet calorie or protein goals as the nutritional understanding of food intake is begun.  We discussed the 10:1 ratio when reading a food label.  Patient agrees to keep a food log either electronically or on paper and bring to the next appointment to be able to dissect and discuss it with provider.    Exercise:  All adults should avoid inactivity. Some activity is better than none, and adults who participate in any amount of physical activity, gain some health benefits.  Behavior Modification:  We discussed the following Behavioral Modification Strategies today: increasing lean protein intake, decreasing simple carbohydrates, increasing vegetables, meal planning and cooking strategies, keeping healthy foods in the home, avoiding temptations, and planning for success.  Will continue Wegovy  to 0.5mg  weekly.   Return in about 3 weeks (around 02/17/2024).   She was informed of the importance  of frequent follow up visits to maximize her success with intensive lifestyle modifications for her multiple health conditions.  Attestation Statements:   Reviewed by clinician on day of visit: allergies, medications, problem list, medical history, surgical history, family history, social history, and previous encounter notes.   Donaciano Frizzle, MD

## 2024-02-02 DIAGNOSIS — R632 Polyphagia: Secondary | ICD-10-CM | POA: Insufficient documentation

## 2024-02-02 NOTE — Assessment & Plan Note (Signed)
 Anthropometric Measurements Height: 5' 5.5" (1.664 m) Weight: (!) 304 lb (137.9 kg) BMI (Calculated): 49.8 Weight at Last Visit: 313 lb Weight Lost Since Last Visit: 9 Weight Gained Since Last Visit: 0 Starting Weight: 300 lb Total Weight Loss (lbs): 0 lb (0 kg) Body Composition  Body Fat %: 56.2 % Fat Mass (lbs): 171 lbs Muscle Mass (lbs): 126.6 lbs Visceral Fat Rating : 19 Other Clinical Data Fasting: no Labs: no Today's Visit #: 5 Starting Date: 09/01/23 Comments: 1400-1700/100

## 2024-02-02 NOTE — Assessment & Plan Note (Signed)
 Patient is doing well on Wegovy  with no GI side effects.  She needs a refill today.  Will continue on current dose as patient is getting good satiety and still able to get total quantity of macronutrients and calories in.

## 2024-02-03 LAB — GENECONNECT MOLECULAR SCREEN: Genetic Analysis Overall Interpretation: NEGATIVE

## 2024-02-11 ENCOUNTER — Ambulatory Visit (INDEPENDENT_AMBULATORY_CARE_PROVIDER_SITE_OTHER): Admitting: Family Medicine

## 2024-02-12 ENCOUNTER — Encounter (HOSPITAL_BASED_OUTPATIENT_CLINIC_OR_DEPARTMENT_OTHER): Payer: Self-pay | Admitting: Emergency Medicine

## 2024-02-12 ENCOUNTER — Emergency Department (HOSPITAL_BASED_OUTPATIENT_CLINIC_OR_DEPARTMENT_OTHER)

## 2024-02-12 ENCOUNTER — Emergency Department (HOSPITAL_BASED_OUTPATIENT_CLINIC_OR_DEPARTMENT_OTHER)
Admission: EM | Admit: 2024-02-12 | Discharge: 2024-02-13 | Disposition: A | Discharge status: Home / Self Care | Attending: Emergency Medicine | Admitting: Emergency Medicine

## 2024-02-12 ENCOUNTER — Other Ambulatory Visit: Payer: Self-pay

## 2024-02-12 ENCOUNTER — Ambulatory Visit (INDEPENDENT_AMBULATORY_CARE_PROVIDER_SITE_OTHER): Admitting: Internal Medicine

## 2024-02-12 DIAGNOSIS — Z91040 Latex allergy status: Secondary | ICD-10-CM | POA: Diagnosis not present

## 2024-02-12 DIAGNOSIS — R102 Pelvic and perineal pain: Secondary | ICD-10-CM | POA: Diagnosis not present

## 2024-02-12 DIAGNOSIS — N739 Female pelvic inflammatory disease, unspecified: Secondary | ICD-10-CM | POA: Diagnosis not present

## 2024-02-12 DIAGNOSIS — R109 Unspecified abdominal pain: Secondary | ICD-10-CM | POA: Diagnosis not present

## 2024-02-12 DIAGNOSIS — N730 Acute parametritis and pelvic cellulitis: Secondary | ICD-10-CM | POA: Insufficient documentation

## 2024-02-12 DIAGNOSIS — J45909 Unspecified asthma, uncomplicated: Secondary | ICD-10-CM | POA: Insufficient documentation

## 2024-02-12 DIAGNOSIS — R1032 Left lower quadrant pain: Secondary | ICD-10-CM | POA: Diagnosis not present

## 2024-02-12 DIAGNOSIS — K5792 Diverticulitis of intestine, part unspecified, without perforation or abscess without bleeding: Secondary | ICD-10-CM | POA: Diagnosis not present

## 2024-02-12 LAB — CBC
HCT: 42 % (ref 36.0–46.0)
Hemoglobin: 13.5 g/dL (ref 12.0–15.0)
MCH: 29.9 pg (ref 26.0–34.0)
MCHC: 32.1 g/dL (ref 30.0–36.0)
MCV: 93.1 fL (ref 80.0–100.0)
Platelets: 327 10*3/uL (ref 150–400)
RBC: 4.51 MIL/uL (ref 3.87–5.11)
RDW: 13.2 % (ref 11.5–15.5)
WBC: 6.4 10*3/uL (ref 4.0–10.5)
nRBC: 0 % (ref 0.0–0.2)

## 2024-02-12 LAB — URINALYSIS, ROUTINE W REFLEX MICROSCOPIC
Bilirubin Urine: NEGATIVE
Glucose, UA: NEGATIVE mg/dL
Ketones, ur: 15 mg/dL — AB
Nitrite: NEGATIVE
RBC / HPF: >50 RBC/hpf (ref 0–5)
Specific Gravity, Urine: 1.029 (ref 1.005–1.030)
pH: 5.5 (ref 5.0–8.0)

## 2024-02-12 LAB — COMPREHENSIVE METABOLIC PANEL WITH GFR
ALT: 18 U/L (ref 0–44)
AST: 16 U/L (ref 15–41)
Albumin: 4.3 g/dL (ref 3.5–5.0)
Alkaline Phosphatase: 104 U/L (ref 38–126)
Anion gap: 13 (ref 5–15)
BUN: 11 mg/dL (ref 6–20)
CO2: 23 mmol/L (ref 22–32)
Calcium: 9.6 mg/dL (ref 8.9–10.3)
Chloride: 105 mmol/L (ref 98–111)
Creatinine, Ser: 0.84 mg/dL (ref 0.44–1.00)
GFR, Estimated: >60 mL/min (ref >60)
Glucose, Bld: 93 mg/dL (ref 70–99)
Potassium: 4.5 mmol/L (ref 3.5–5.1)
Sodium: 141 mmol/L (ref 135–145)
Total Bilirubin: 0.2 mg/dL (ref 0.0–1.2)
Total Protein: 7.5 g/dL (ref 6.5–8.1)

## 2024-02-12 LAB — LIPASE, BLOOD: Lipase: 28 U/L (ref 11–51)

## 2024-02-12 LAB — PREGNANCY, URINE: Preg Test, Ur: NEGATIVE

## 2024-02-12 MED ORDER — ONDANSETRON HCL 4 MG/2ML IJ SOLN
4.0000 mg | Freq: Once | INTRAMUSCULAR | Status: AC
  Administered 2024-02-12: 4 mg via INTRAVENOUS
  Filled 2024-02-12: qty 2

## 2024-02-12 MED ORDER — DICYCLOMINE HCL 10 MG/ML IM SOLN
20.0000 mg | Freq: Once | INTRAMUSCULAR | Status: AC
  Administered 2024-02-12: 20 mg via INTRAMUSCULAR
  Filled 2024-02-12: qty 2

## 2024-02-12 MED ORDER — IOHEXOL 300 MG/ML  SOLN
100.0000 mL | Freq: Once | INTRAMUSCULAR | Status: AC | PRN
Start: 2024-02-12 — End: 2024-02-12
  Administered 2024-02-12: 100 mL via INTRAVENOUS

## 2024-02-12 NOTE — ED Provider Notes (Signed)
 [place] EMERGENCY DEPARTMENT AT [facility] Provider Note   CSN: [id number] Arrival date & time: 02/12/24  1820     History {Add pertinent medical, surgical, social history, OB history to HPI:1} Chief Complaint  Patient presents with   Abdominal Pain    Tara Singleton is a 38 y.o. female.  HPI     Asthma, PCOS, endometriosis   On menses, spotting for 4-5 days Today bleeding heavier Then started to have tearing pain, like hot poker, felt like this the last time had a cyst 2nd IUD and the second time this has happened Pain on the left side, towards center Nausea No vomiting Stayed in bed, the more activity they do the more it hurts If straightens out more pain, feels better with bent on side No diarrhea but did have soft stool Typically have constipation over the last day or two has been more soft stool No fever or chills No dysuria, does have abdominal pain when urinating but is not worse with that Earlier did radiate to the back No vaginal discharge  Kidney stone hx but does not feel like that    Past Medical History:  Diagnosis Date   Anxiety    Asthma    B12 deficiency    Constipation    Depression    Endometriosis    GERD (gastroesophageal reflux disease)    Headache    Infertility, female    PCOS (polycystic ovarian syndrome)    Sleep apnea    Swelling of lower extremity     Home Medications Prior to Admission medications   Medication Sig Start Date End Date Taking? Authorizing Provider  ABILIFY 5 MG tablet  03/13/23   [provider]  ALPRAZolam  (XANAX ) 0.25 MG tablet Take 1 tablet (0.25 mg total) by mouth 2 (two) times daily as needed for anxiety. 03/14/23   Henson, Vickie L, NP-C  cetirizine (ZYRTEC) 10 MG tablet Take by mouth.    [provider]  cholecalciferol (VITAMIN D3) 25 MCG (1000 UNIT) tablet Take 1,000 Units by mouth daily.    [provider]  EPINEPHrine  0.3 mg/0.3 mL IJ SOAJ injection Inject 0.3 mg  into the muscle as needed for anaphylaxis. 12/24/23   Henson, Vickie L, NP-C  FLUoxetine (PROZAC) 40 MG capsule Take 40 mg by mouth daily. 02/06/22   [provider]  lamoTRIgine (LAMICTAL) 25 MG tablet Take 25 mg by mouth daily.    [provider]  mometasone (NASONEX) 50 MCG/ACT nasal spray Place 2 sprays into the nose daily.    [provider]  ondansetron  (ZOFRAN -ODT) 4 MG disintegrating tablet Take 4 mg by mouth every 12 (twelve) hours. 02/22/22   [provider]  PROAIR HFA 108 (90 Base) MCG/ACT inhaler Inhale into the lungs. 07/26/21   [provider]  rizatriptan (MAXALT) 10 MG tablet Take by mouth. 04/12/20   [provider]  Semaglutide -Weight Management (WEGOVY ) 0.5 MG/0.5ML SOAJ Inject 0.5 mg into the skin once a week. 01/27/24   Jenean Minus, MD  verapamil (CALAN-SR) 120 MG CR tablet Take 120 mg by mouth at bedtime. 12/28/21   [provider]  Vitamin D , Ergocalciferol , (DRISDOL ) 1.25 MG (50000 UNIT) CAPS capsule Take 1 capsule (50,000 Units total) by mouth every 7 (seven) days. 12/25/23   Jasmine Mesi D, MD      Allergies    Avocado, Betula alba oil, Kiwi extract, Shellfish allergy, Sulfa antibiotics, White birch, Latex, Sulfur, Avocado extract allergy skin test, Macrobid [nitrofurantoin], and  Mango flavoring agent (non-screening)    Review of Systems   Review of Systems  Physical Exam Updated Vital Signs BP (!) 126/49   Pulse 73   Temp 98.4 F (36.9 C) (Oral)   Resp 18   LMP 01/06/2024   SpO2 98%  Physical Exam  ED Results / Procedures / Treatments   Labs (all labs ordered are listed, but only abnormal results are displayed) Labs Reviewed  URINALYSIS, ROUTINE W REFLEX MICROSCOPIC - Abnormal; Notable for the following components:      Result Value   Hgb urine dipstick LARGE (*)    Ketones, ur 15 (*)    Protein, ur TRACE (*)    Leukocytes,Ua TRACE (*)    Bacteria, UA RARE (*)    All other components  within normal limits  LIPASE, BLOOD  COMPREHENSIVE METABOLIC PANEL WITH GFR  CBC  PREGNANCY, URINE    EKG None  Radiology US  Pelvis Complete Result Date: 02/12/2024 CLINICAL DATA:  Pelvic pain. Unsure LMP. An intrauterine device is present. EXAM: TRANSABDOMINAL AND TRANSVAGINAL ULTRASOUND OF PELVIS DOPPLER ULTRASOUND OF OVARIES TECHNIQUE: Both transabdominal and transvaginal ultrasound examinations of the pelvis were performed. Transabdominal technique was performed for global imaging of the pelvis including uterus, ovaries, adnexal regions, and pelvic cul-de-sac. It was necessary to proceed with endovaginal exam following the transabdominal exam to visualize the ovaries and endometrium. Color and duplex Doppler ultrasound was utilized to evaluate blood flow to the ovaries. COMPARISON:  CT abdomen and pelvis 02/27/2022 FINDINGS: Uterus Measurements: 6.1 x 3.3 x 4.1 cm = volume: 43 mL. No fibroids or other mass visualized. Endometrium Thickness: 3 mm. Echogenic stripe within the endometrium consistent with intrauterine device. Positioning appears appropriate. Right ovary Measurements: 3.8 x 2.9 x 2.7 cm = volume: 15 mL. Normal appearance/no adnexal mass. Left ovary Measurements: 2.8 x 1.9 x 2.2 cm = volume: 6 mL. Normal appearance/no adnexal mass. Pulsed Doppler evaluation of both ovaries demonstrates normal low-resistance arterial and venous waveforms. Other findings Trace free fluid is demonstrated, most likely physiologic. IMPRESSION: 1. Intrauterine device appears in place. 2. Otherwise normal appearance of the uterus and ovaries. No evidence of ovarian mass or torsion. Electronically Signed   By: Boyce Byes M.D.   On: 02/12/2024 22:20   US  Transvaginal Non-OB Result Date: 02/12/2024 CLINICAL DATA:  Pelvic pain. Unsure LMP. An intrauterine device is present. EXAM: TRANSABDOMINAL AND TRANSVAGINAL ULTRASOUND OF PELVIS DOPPLER ULTRASOUND OF OVARIES TECHNIQUE: Both transabdominal and transvaginal  ultrasound examinations of the pelvis were performed. Transabdominal technique was performed for global imaging of the pelvis including uterus, ovaries, adnexal regions, and pelvic cul-de-sac. It was necessary to proceed with endovaginal exam following the transabdominal exam to visualize the ovaries and endometrium. Color and duplex Doppler ultrasound was utilized to evaluate blood flow to the ovaries. COMPARISON:  CT abdomen and pelvis 02/27/2022 FINDINGS: Uterus Measurements: 6.1 x 3.3 x 4.1 cm = volume: 43 mL. No fibroids or other mass visualized. Endometrium Thickness: 3 mm. Echogenic stripe within the endometrium consistent with intrauterine device. Positioning appears appropriate. Right ovary Measurements: 3.8 x 2.9 x 2.7 cm = volume: 15 mL. Normal appearance/no adnexal mass. Left ovary Measurements: 2.8 x 1.9 x 2.2 cm = volume: 6 mL. Normal appearance/no adnexal mass. Pulsed Doppler evaluation of both ovaries demonstrates normal low-resistance arterial and venous waveforms. Other findings Trace free fluid is demonstrated, most likely physiologic. IMPRESSION: 1. Intrauterine device appears in place. 2. Otherwise normal appearance of the uterus and ovaries. No evidence of ovarian mass  or torsion. Electronically Signed   By: Boyce Byes M.D.   On: 02/12/2024 22:20   US  Art/Ven Flow Abd Pelv Doppler Result Date: 02/12/2024 CLINICAL DATA:  Pelvic pain. Unsure LMP. An intrauterine device is present. EXAM: TRANSABDOMINAL AND TRANSVAGINAL ULTRASOUND OF PELVIS DOPPLER ULTRASOUND OF OVARIES TECHNIQUE: Both transabdominal and transvaginal ultrasound examinations of the pelvis were performed. Transabdominal technique was performed for global imaging of the pelvis including uterus, ovaries, adnexal regions, and pelvic cul-de-sac. It was necessary to proceed with endovaginal exam following the transabdominal exam to visualize the ovaries and endometrium. Color and duplex Doppler ultrasound was utilized to evaluate  blood flow to the ovaries. COMPARISON:  CT abdomen and pelvis 02/27/2022 FINDINGS: Uterus Measurements: 6.1 x 3.3 x 4.1 cm = volume: 43 mL. No fibroids or other mass visualized. Endometrium Thickness: 3 mm. Echogenic stripe within the endometrium consistent with intrauterine device. Positioning appears appropriate. Right ovary Measurements: 3.8 x 2.9 x 2.7 cm = volume: 15 mL. Normal appearance/no adnexal mass. Left ovary Measurements: 2.8 x 1.9 x 2.2 cm = volume: 6 mL. Normal appearance/no adnexal mass. Pulsed Doppler evaluation of both ovaries demonstrates normal low-resistance arterial and venous waveforms. Other findings Trace free fluid is demonstrated, most likely physiologic. IMPRESSION: 1. Intrauterine device appears in place. 2. Otherwise normal appearance of the uterus and ovaries. No evidence of ovarian mass or torsion. Electronically Signed   By: Boyce Byes M.D.   On: 02/12/2024 22:20    Procedures Procedures  {Document cardiac monitor, telemetry assessment procedure when appropriate:1}  Medications Ordered in ED Medications - No data to display  ED Course/ Medical Decision Making/ A&P   {   Click here for ABCD2, HEART and other calculatorsREFRESH Note before signing :1}                              Medical Decision Making Amount and/or Complexity of Data Reviewed Labs: ordered. Radiology: ordered.   ***  {Document critical care time when appropriate:1} {Document review of labs and clinical decision tools ie heart score, Chads2Vasc2 etc:1}  {Document your independent review of radiology images, and any outside records:1} {Document your discussion with family members, caretakers, and with consultants:1} {Document social determinants of health affecting pt's care:1} {Document your decision making why or why not admission, treatments were needed:1} Final Clinical Impression(s) / ED Diagnoses Final diagnoses:  None    Rx / DC Orders ED Discharge Orders     None

## 2024-02-12 NOTE — ED Triage Notes (Signed)
 Left side lower abdo cramping/ ripping stabbing pain Hx cysts

## 2024-02-12 NOTE — ED Notes (Signed)
 Patient transported to Ultrasound

## 2024-02-13 LAB — WET PREP, GENITAL
Clue Cells Wet Prep HPF POC: NONE SEEN
Sperm: NONE SEEN
Trich, Wet Prep: NONE SEEN
WBC, Wet Prep HPF POC: <10 (ref <10)
Yeast Wet Prep HPF POC: NONE SEEN

## 2024-02-13 MED ORDER — DOXYCYCLINE HYCLATE 100 MG PO CAPS: 100.0000 mg | ORAL_CAPSULE | Freq: Two times a day (BID) | ORAL | 0 refills | Status: DC

## 2024-02-13 MED ORDER — LIDOCAINE HCL (PF) 1 % IJ SOLN
INTRAMUSCULAR | Status: AC
  Administered 2024-02-13: 1 mL via INTRAMUSCULAR
  Filled 2024-02-13: qty 5

## 2024-02-13 MED ORDER — DICYCLOMINE HCL 20 MG PO TABS: 20.0000 mg | ORAL_TABLET | Freq: Two times a day (BID) | ORAL | 0 refills | Status: DC

## 2024-02-13 MED ORDER — METRONIDAZOLE 500 MG PO TABS: 500.0000 mg | ORAL_TABLET | Freq: Two times a day (BID) | ORAL | 0 refills | Status: DC

## 2024-02-13 MED ORDER — CEFTRIAXONE SODIUM 500 MG IJ SOLR
500.0000 mg | Freq: Once | INTRAMUSCULAR | Status: AC
  Administered 2024-02-13: 500 mg via INTRAMUSCULAR
  Filled 2024-02-13: qty 500

## 2024-02-16 ENCOUNTER — Telehealth: Payer: Self-pay | Admitting: Family Medicine

## 2024-02-16 LAB — GC/CHLAMYDIA PROBE AMP (~~LOC~~) NOT AT ARMC
Chlamydia: NEGATIVE
Comment: NEGATIVE
Comment: NORMAL
Neisseria Gonorrhea: NEGATIVE

## 2024-02-16 NOTE — Telephone Encounter (Unsigned)
 Copied from CRM [id number]. Topic: General - Other >> Feb 16, 2024  4:08 PM Howard Macho wrote: Reason for CRM: Kim from [facility] called stating the patient is looking for a medication called neffy 2mg . Burdette Carolin stated their pharmacy is called  [facility] and there are not the patient primary pharmacy but they do have discount program where the patient can get the medication CB [phone] Fax [phone]

## 2024-02-18 ENCOUNTER — Ambulatory Visit
Admission: EM | Admit: 2024-02-18 | Discharge: 2024-02-18 | Disposition: A | Discharge status: Home / Self Care | Attending: Nurse Practitioner | Admitting: Nurse Practitioner

## 2024-02-18 ENCOUNTER — Ambulatory Visit

## 2024-02-18 DIAGNOSIS — T7840XA Allergy, unspecified, initial encounter: Secondary | ICD-10-CM

## 2024-02-18 DIAGNOSIS — Z8742 Personal history of other diseases of the female genital tract: Secondary | ICD-10-CM | POA: Diagnosis not present

## 2024-02-18 MED ORDER — CLINDAMYCIN HCL 150 MG PO CAPS: 450.0000 mg | ORAL_CAPSULE | Freq: Three times a day (TID) | ORAL | 0 refills | Status: AC

## 2024-02-18 MED ORDER — DEXAMETHASONE SODIUM PHOSPHATE 10 MG/ML IJ SOLN
10.0000 mg | INTRAMUSCULAR | Status: AC
  Administered 2024-02-18: 10 mg via INTRAMUSCULAR

## 2024-02-18 NOTE — ED Triage Notes (Signed)
 Pt reports possible allergic reaction to antibiotics that she received on Friday, itching and rash present.

## 2024-02-18 NOTE — ED Provider Notes (Signed)
 [facility]    CSN: [id number] Arrival date & time: 02/18/24  0802      History   Chief Complaint No chief complaint on file.   HPI Tara Singleton is a 38 y.o. female.   The history is provided by the patient.   Patient presents for complaints of itching that has been present for the past 3 days.  Patient reports she was seen in the emergency department on 02/12/2024 and treated for PID with ceftriaxone  IM, doxycycline , and metronidazole .  Patient reports that she has been taking doxycycline  and metronidazole , but developed itching over the past 24 hours.  Patient reports that the itching is pretty bad.  States that she has taken doxycycline  in the past, but cannot recall if she had an allergic reaction or not, states that she has never taking metronidazole .  Patient was also prescribed Bentyl , but she never started the medication.  Patient denies fever, chills, chest pain, shortness of breath, difficulty breathing, tongue swelling, lip swelling, throat swelling, wheezing, or cough.  Patient states that she does have scratchiness in her throat.  States that she did take Benadryl  for her symptoms.  She has an underlying history of seasonal allergies, states that she takes Zyrtec daily.  She is scheduled for follow-up with her PCP on 02/27/2024. Past Medical History:  Diagnosis Date   Anxiety    Asthma    B12 deficiency    Constipation    Depression    Endometriosis    GERD (gastroesophageal reflux disease)    Headache    Infertility, female    PCOS (polycystic ovarian syndrome)    Sleep apnea    Swelling of lower extremity     Patient Active Problem List   Diagnosis Date Noted   Polyphagia 02/02/2024   Insulin  resistance 09/02/2023   Anxiety and depression 04/25/2023   Panic attacks 03/16/2023   Morbid obesity (HCC) 03/16/2023   Mild sleep apnea 03/16/2023   Anemia 03/16/2023   Obesity (BMI 30-39.9) 08/02/2022   Weight gain 08/02/2022   Vitamin D  deficiency  08/02/2022   Snores 08/02/2022   Daytime somnolence 08/02/2022   Hyperlipidemia 08/02/2022   Superior glenoid labrum lesion of right shoulder 08/02/2022   PCOS (polycystic ovarian syndrome) 07/26/2022   Pelvic floor dysfunction 07/26/2022   Pituitary adenoma (HCC) 07/26/2022   At risk for sleep apnea 04/19/2022   Migraine with aura and without status migrainosus, not intractable 04/13/2021   Endometriosis, unspecified 03/17/2021   PMDD (premenstrual dysphoric disorder) 03/17/2021   Essential hypertension 04/12/2020   Tachycardia 11/03/2018   Dizziness 08/19/2018   Paresthesia 08/19/2018   Shellfish allergy 06/11/2016   Dysfunction of both eustachian tubes 10/25/2015   Acute bronchitis 06/24/2015   Allergic reaction to food 06/24/2015   Moderate persistent asthma without complication 06/24/2015   Other seasonal allergic rhinitis 06/24/2015   Urticaria 06/24/2015   Musculoskeletal neck pain 11/10/2014   Referred otalgia 11/10/2014   Temporomandibular joint dysfunction 11/10/2014    Past Surgical History:  Procedure Laterality Date   EXTRACORPOREAL SHOCK WAVE LITHOTRIPSY Left 01/28/2022   Procedure: EXTRACORPOREAL SHOCK WAVE LITHOTRIPSY (ESWL);  Surgeon: Florencio Hunting, MD;  Location: [facility];  Service: Urology;  Laterality: Left;    OB History     Gravida  5   Para      Term      Preterm      AB      Living         SAB  IAB      Ectopic      Multiple      Live Births               Home Medications    Prior to Admission medications   Medication Sig Start Date End Date Taking? Authorizing Provider  clindamycin (CLEOCIN) 150 MG capsule Take 3 capsules (450 mg total) by mouth 3 (three) times daily for 7 days. 02/18/24 02/25/24 Yes Leath-Warren, Belen Bowers, NP  ABILIFY 5 MG tablet  03/13/23   [provider]  ALPRAZolam  (XANAX ) 0.25 MG tablet Take 1 tablet (0.25 mg total) by mouth 2 (two) times daily as needed for anxiety.  03/14/23   Henson, Vickie L, NP-C  cetirizine (ZYRTEC) 10 MG tablet Take by mouth.    [provider]  cholecalciferol (VITAMIN D3) 25 MCG (1000 UNIT) tablet Take 1,000 Units by mouth daily.    [provider]  dicyclomine  (BENTYL ) 20 MG tablet Take 1 tablet (20 mg total) by mouth 2 (two) times daily. 02/13/24   Scarlette Currier, MD  doxycycline  (VIBRAMYCIN ) 100 MG capsule Take 1 capsule (100 mg total) by mouth 2 (two) times daily for 14 days. 02/13/24 02/27/24  Scarlette Currier, MD  EPINEPHrine  0.3 mg/0.3 mL IJ SOAJ injection Inject 0.3 mg into the muscle as needed for anaphylaxis. 12/24/23   Henson, Vickie L, NP-C  FLUoxetine (PROZAC) 40 MG capsule Take 40 mg by mouth daily. 02/06/22   [provider]  lamoTRIgine (LAMICTAL) 25 MG tablet Take 25 mg by mouth daily.    [provider]  metroNIDAZOLE  (FLAGYL ) 500 MG tablet Take 1 tablet (500 mg total) by mouth 2 (two) times daily for 14 days. 02/13/24 02/27/24  Scarlette Currier, MD  mometasone (NASONEX) 50 MCG/ACT nasal spray Place 2 sprays into the nose daily.    [provider]  ondansetron  (ZOFRAN -ODT) 4 MG disintegrating tablet Take 4 mg by mouth every 12 (twelve) hours. 02/22/22   [provider]  PROAIR HFA 108 (90 Base) MCG/ACT inhaler Inhale into the lungs. 07/26/21   [provider]  rizatriptan (MAXALT) 10 MG tablet Take by mouth. 04/12/20   [provider]  Semaglutide -Weight Management (WEGOVY ) 0.5 MG/0.5ML SOAJ Inject 0.5 mg into the skin once a week. 01/27/24   Jenean Minus, MD  verapamil (CALAN-SR) 120 MG CR tablet Take 120 mg by mouth at bedtime. 12/28/21   [provider]  Vitamin D , Ergocalciferol , (DRISDOL ) 1.25 MG (50000 UNIT) CAPS capsule Take 1 capsule (50,000 Units total) by mouth every 7 (seven) days. 12/25/23   Glenora Laos, MD    Family History Family History  Problem Relation Age of Onset   High blood pressure Mother    High Cholesterol Mother     Stroke Mother    Thyroid disease Mother    Cancer Mother    Depression Mother    Anxiety disorder Mother    Obesity Mother    Asthma Father    Anxiety disorder Father    Sleep apnea Father    Alcoholism Father    Drug abuse Father    Obesity Father     Social History Social History   Tobacco Use   Smoking status: Never   Smokeless tobacco: Never  Substance Use Topics   Alcohol use: Never   Drug use: Never     Allergies   Avocado, Betula alba oil, Kiwi extract, Shellfish allergy, Sulfa antibiotics, White birch, Latex, Sulfur, Avocado extract allergy skin test,  Macrobid [nitrofurantoin], and Mango flavoring agent (non-screening)   Review of Systems Review of Systems Per HPI  Physical Exam Triage Vital Signs ED Triage Vitals  Encounter Vitals Group     BP 02/18/24 0828 126/80     Systolic BP Percentile --      Diastolic BP Percentile --      Pulse Rate 02/18/24 0828 71     Resp 02/18/24 0828 18     Temp 02/18/24 0828 98 F (36.7 C)     Temp Source 02/18/24 0828 Oral     SpO2 02/18/24 0828 98 %     Weight --      Height --      Head Circumference --      Peak Flow --      Pain Score 02/18/24 0831 0     Pain Loc --      Pain Education --      Exclude from Growth Chart --    No data found.  Updated Vital Signs BP 126/80 (BP Location: Right Arm)   Pulse 71   Temp 98 F (36.7 C) (Oral)   Resp 18   LMP 01/06/2024   SpO2 98%   Visual Acuity Right Eye Distance:   Left Eye Distance:   Bilateral Distance:    Right Eye Near:   Left Eye Near:    Bilateral Near:     Physical Exam Vitals and nursing note reviewed.  Constitutional:      General: She is not in acute distress.    Appearance: Normal appearance.  HENT:     Head: Normocephalic.     Nose: Nose normal.     Mouth/Throat:     Lips: Pink.     Mouth: Mucous membranes are moist.     Pharynx: Postnasal drip present. No pharyngeal swelling, oropharyngeal exudate, posterior oropharyngeal  erythema or uvula swelling.     Tonsils: No tonsillar exudate or tonsillar abscesses.     Comments: Airway is clear and patent, no obstruction present.Cobblestoning present to posterior oropharynx  Eyes:     Extraocular Movements: Extraocular movements intact.     Conjunctiva/sclera: Conjunctivae normal.     Pupils: Pupils are equal, round, and reactive to light.  Cardiovascular:     Rate and Rhythm: Normal rate and regular rhythm.     Pulses: Normal pulses.     Heart sounds: Normal heart sounds.  Pulmonary:     Effort: Pulmonary effort is normal.     Breath sounds: Normal breath sounds.  Abdominal:     General: Bowel sounds are normal.     Palpations: Abdomen is soft.  Musculoskeletal:     Cervical back: Normal range of motion.  Skin:    General: Skin is warm and dry.  Neurological:     General: No focal deficit present.     Mental Status: She is alert and oriented to person, place, and time.  Psychiatric:        Mood and Affect: Mood normal.        Behavior: Behavior normal.      UC Treatments / Results  Labs (all labs ordered are listed, but only abnormal results are displayed) Labs Reviewed - No data to display  EKG   Radiology No results found.  Procedures Procedures (including critical care time)  Medications Ordered in UC Medications  dexamethasone (DECADRON) injection 10 mg (10 mg Intramuscular Given 02/18/24 0848)    Initial Impression / Assessment and Plan / UC Course  I have reviewed the triage vital signs and the nursing notes.  Pertinent labs & imaging results that were available during my care of the patient were reviewed by me and considered in my medical decision making (see chart for details).  Will have patient discontinue metronidazole  and doxycycline  as there is no clear evidence as to which antibiotic may be causing her to have symptoms.  Decadron 10 mg IM administered for inflammation, itching, and allergic reaction.  Will start clindamycin  450 mg 3 times daily for the next 7 days until patient can follow-up with her PCP.  Will also prescribe prednisone  40 mg for the next 5 days for allergic reaction.  Supportive care recommendations were provided and discussed with the patient to include fluids, rest, continuing over-the-counter antihistamines, and to monitor for worsening.  Follow-up with PCP as scheduled.  Patient was given strict ER follow-up precautions.  Patient was in agreement with this plan of care and verbalizes understanding.  All questions were answered.  Patient stable for discharge.   Final Clinical Impressions(s) / UC Diagnoses   Final diagnoses:  Allergic reaction, initial encounter  History of PID     Discharge Instructions      Discontinue the doxycycline  and metronidazole . Take medication as prescribed.  Recommend taking this medication with yogurt or an over-the-counter probiotic. Continue over-the-counter Benadryl  and cetirizine daily. Increase fluids and allow for plenty of rest. Go to the emergency department immediately if you experience worsening itching, shortness of breath, difficulty breathing, tongue swelling, facial swelling, wheezing, or other concerns. Follow-up with your PCP as scheduled. Follow-up as needed.   ED Prescriptions     Medication Sig Dispense Auth. Provider   clindamycin (CLEOCIN) 150 MG capsule Take 3 capsules (450 mg total) by mouth 3 (three) times daily for 7 days. 63 capsule Leath-Warren, Belen Bowers, NP      PDMP not reviewed this encounter.   Hardy Lia, NP 02/18/24 [phone]

## 2024-02-18 NOTE — Discharge Instructions (Addendum)
 Discontinue the doxycycline  and metronidazole . Take medication as prescribed.  Recommend taking this medication with yogurt or an over-the-counter probiotic. Continue over-the-counter Benadryl  and cetirizine daily. Increase fluids and allow for plenty of rest. Go to the emergency department immediately if you experience worsening itching, shortness of breath, difficulty breathing, tongue swelling, facial swelling, wheezing, or other concerns. Follow-up with your PCP as scheduled. Follow-up as needed.

## 2024-02-19 NOTE — Telephone Encounter (Signed)
 Called and advised pharmacy pt will need an appt for this to be prescribed as this has not been discussed

## 2024-02-20 DIAGNOSIS — F330 Major depressive disorder, recurrent, mild: Secondary | ICD-10-CM | POA: Diagnosis not present

## 2024-02-22 ENCOUNTER — Telehealth: Admitting: Family Medicine

## 2024-02-22 DIAGNOSIS — N730 Acute parametritis and pelvic cellulitis: Secondary | ICD-10-CM

## 2024-02-22 DIAGNOSIS — B3731 Acute candidiasis of vulva and vagina: Secondary | ICD-10-CM

## 2024-02-22 MED ORDER — FLUCONAZOLE 150 MG PO TABS: 150.0000 mg | ORAL_TABLET | Freq: Every day | ORAL | 0 refills | Status: AC

## 2024-02-22 MED ORDER — AZITHROMYCIN 250 MG PO TABS: ORAL_TABLET | ORAL | 0 refills | Status: AC

## 2024-02-22 NOTE — Progress Notes (Signed)
 Virtual Visit Consent   Tara Singleton, you are scheduled for a virtual visit with a [facility] provider today. Just as with appointments in the office, your consent must be obtained to participate. Your consent will be active for this visit and any virtual visit you may have with one of our providers in the next 365 days. If you have a MyChart account, a copy of this consent can be sent to you electronically.  As this is a virtual visit, video technology does not allow for your provider to perform a traditional examination. This may limit your provider's ability to fully assess your condition. If your provider identifies any concerns that need to be evaluated in person or the need to arrange testing (such as labs, EKG, etc.), we will make arrangements to do so. Although advances in technology are sophisticated, we cannot ensure that it will always work on either your end or our end. If the connection with a video visit is poor, the visit may have to be switched to a telephone visit. With either a video or telephone visit, we are not always able to ensure that we have a secure connection.  By engaging in this virtual visit, you consent to the provision of healthcare and authorize for your insurance to be billed (if applicable) for the services provided during this visit. Depending on your insurance coverage, you may receive a charge related to this service.  I need to obtain your verbal consent now. Are you willing to proceed with your visit today? Tara Singleton has provided verbal consent on 02/22/2024 for a virtual visit (video or telephone). Tara Huger, FNP  Date: 02/22/2024 5:59 PM   Virtual Visit via Video Note   I, Tara Singleton, connected with  Tara Singleton  ([id number], 17-Sep-1985) on 02/22/24 at  6:00 PM EDT by a video-enabled telemedicine application and verified that I am speaking with the correct person using two identifiers.  Location: Patient: Virtual Visit Location Patient:  Home Provider: Virtual Visit Location Provider: Home Office   I discussed the limitations of evaluation and management by telemedicine and the availability of in person appointments. The patient expressed understanding and agreed to proceed.    History of Present Illness: Tara Singleton is a 38 y.o. who identifies as a female who was assigned female at birth, and is being seen today for vaginal discharge with itching and thick white discharge. She has also been treated for PID with metronidazole  and doxycycline  that she had a reaction to then they started clindamycin . Having stomach cramps and severe diarrhea. No fever. Unable to tolerate the new antibiotic. Tara AasHas apptmt with pcp Friday.   HPI: HPI  Problems:  Patient Active Problem List   Diagnosis Date Noted   Polyphagia 02/02/2024   Insulin  resistance 09/02/2023   Anxiety and depression 04/25/2023   Panic attacks 03/16/2023   Morbid obesity (HCC) 03/16/2023   Mild sleep apnea 03/16/2023   Anemia 03/16/2023   Obesity (BMI 30-39.9) 08/02/2022   Weight gain 08/02/2022   Vitamin D  deficiency 08/02/2022   Snores 08/02/2022   Daytime somnolence 08/02/2022   Hyperlipidemia 08/02/2022   Superior glenoid labrum lesion of right shoulder 08/02/2022   PCOS (polycystic ovarian syndrome) 07/26/2022   Pelvic floor dysfunction 07/26/2022   Pituitary adenoma (HCC) 07/26/2022   At risk for sleep apnea 04/19/2022   Migraine with aura and without status migrainosus, not intractable 04/13/2021   Endometriosis, unspecified 03/17/2021   PMDD (premenstrual dysphoric disorder) 03/17/2021   Essential hypertension 04/12/2020  Tachycardia 11/03/2018   Dizziness 08/19/2018   Paresthesia 08/19/2018   Shellfish allergy 06/11/2016   Dysfunction of both eustachian tubes 10/25/2015   Acute bronchitis 06/24/2015   Allergic reaction to food 06/24/2015   Moderate persistent asthma without complication 06/24/2015   Other seasonal allergic rhinitis  06/24/2015   Urticaria 06/24/2015   Musculoskeletal neck pain 11/10/2014   Referred otalgia 11/10/2014   Temporomandibular joint dysfunction 11/10/2014    Allergies:  Allergies  Allergen Reactions   Avocado Hives, Nausea And Vomiting, Rash, Shortness Of Breath and Swelling   Betula Alba Oil Anaphylaxis   Kiwi Extract Dermatitis, Hives, Rash and Shortness Of Breath   Shellfish Allergy Anaphylaxis   Sulfa Antibiotics Hives, Other (See Comments) and Rash    Upset stomach    White Birch Anaphylaxis    Birch trees   Latex Hives   Sulfur Other (See Comments)    Welts   Avocado Extract Allergy Skin Test Hives   Macrobid [Nitrofurantoin] Hives   Mango Flavoring Agent (Non-Screening) Hives   Medications:  Current Outpatient Medications:    ABILIFY 5 MG tablet, , Disp: , Rfl:    ALPRAZolam  (XANAX ) 0.25 MG tablet, Take 1 tablet (0.25 mg total) by mouth 2 (two) times daily as needed for anxiety., Disp: 20 tablet, Rfl: 0   cetirizine (ZYRTEC) 10 MG tablet, Take by mouth., Disp: , Rfl:    cholecalciferol (VITAMIN D3) 25 MCG (1000 UNIT) tablet, Take 1,000 Units by mouth daily., Disp: , Rfl:    clindamycin  (CLEOCIN ) 150 MG capsule, Take 3 capsules (450 mg total) by mouth 3 (three) times daily for 7 days., Disp: 63 capsule, Rfl: 0   dicyclomine  (BENTYL ) 20 MG tablet, Take 1 tablet (20 mg total) by mouth 2 (two) times daily., Disp: 20 tablet, Rfl: 0   doxycycline  (VIBRAMYCIN ) 100 MG capsule, Take 1 capsule (100 mg total) by mouth 2 (two) times daily for 14 days., Disp: 28 capsule, Rfl: 0   EPINEPHrine  0.3 mg/0.3 mL IJ SOAJ injection, Inject 0.3 mg into the muscle as needed for anaphylaxis., Disp: 1 each, Rfl: 0   FLUoxetine (PROZAC) 40 MG capsule, Take 40 mg by mouth daily., Disp: , Rfl:    lamoTRIgine (LAMICTAL) 25 MG tablet, Take 25 mg by mouth daily., Disp: , Rfl:    metroNIDAZOLE  (FLAGYL ) 500 MG tablet, Take 1 tablet (500 mg total) by mouth 2 (two) times daily for 14 days., Disp: 28 tablet,  Rfl: 0   mometasone (NASONEX) 50 MCG/ACT nasal spray, Place 2 sprays into the nose daily., Disp: , Rfl:    ondansetron  (ZOFRAN -ODT) 4 MG disintegrating tablet, Take 4 mg by mouth every 12 (twelve) hours., Disp: , Rfl:    PROAIR HFA 108 (90 Base) MCG/ACT inhaler, Inhale into the lungs., Disp: , Rfl:    rizatriptan (MAXALT) 10 MG tablet, Take by mouth., Disp: , Rfl:    Semaglutide -Weight Management (WEGOVY ) 0.5 MG/0.5ML SOAJ, Inject 0.5 mg into the skin once a week., Disp: 2 mL, Rfl: 0   verapamil (CALAN-SR) 120 MG CR tablet, Take 120 mg by mouth at bedtime., Disp: , Rfl:    Vitamin D , Ergocalciferol , (DRISDOL ) 1.25 MG (50000 UNIT) CAPS capsule, Take 1 capsule (50,000 Units total) by mouth every 7 (seven) days., Disp: 6 capsule, Rfl: 1  Observations/Objective: Patient is well-developed, well-nourished in no acute distress.  Resting comfortably  at home.  Head is normocephalic, atraumatic.  No labored breathing.  Speech is clear and coherent with logical content.  Patient is alert  and oriented at baseline.    Assessment and Plan: 1. Candidiasis of vagina (Primary)  In no distress, change to zithromax and keep apptmt with pcp Friday. If symptoms worsen go back to ED.  Follow Up Instructions: I discussed the assessment and treatment plan with the patient. The patient was provided an opportunity to ask questions and all were answered. The patient agreed with the plan and demonstrated an understanding of the instructions.  A copy of instructions were sent to the patient via MyChart unless otherwise noted below.     The patient was advised to call back or seek an in-person evaluation if the symptoms worsen or if the condition fails to improve as anticipated.    Anola Mcgough, FNP

## 2024-02-22 NOTE — Patient Instructions (Signed)
Pelvic Inflammatory Disease  Pelvic inflammatory disease (PID) is an infection in some or all of the female reproductive organs. The infection can be in the uterus, ovaries, fallopian tubes, or the surrounding tissues in the pelvis. PID can cause abdominal or pelvic pain that comes on suddenly (acute pelvic pain). PID is a serious infection because it can lead to lasting (chronic) pelvic pain or the inability to have children (infertility). What are the causes? This condition is most often caused by bacteria that is spread during sexual contact. It can also be caused by a bacterial infection of the vagina (bacterial vaginosis) that is not spread by sexual contact. This condition occurs when the infection is not treated and the bacteria travel upward from the vagina or cervix into the reproductive organs. Bacteria may also be introduced into the reproductive organs following: The birth of a baby. A miscarriage. An abortion. Pelvic procedures or surgery. The insertion of an intrauterine device (IUD). A sexual assault. What increases the risk? You are more likely to develop this condition if you: Are younger than 37 years of age. Are sexually active at a young age. Have a history of STI (sexually transmitted infection) or PID. Have unprotected sex or do not use contraceptive barrier methods, such as condoms. Have multiple sexual partners. Have sex with someone who has symptoms of an STI. Use a douche. What are the signs or symptoms? Symptoms of this condition include: Abdominal or pelvic pain. Fever or chills. Abnormal vaginal discharge. Abnormal uterine bleeding. Unusual pain shortly after the end of a menstrual period. Pain with urination or sex. Feeling nauseous or vomiting. How is this diagnosed? This condition may be diagnosed based on: Your medical history. A pelvic exam. This exam can reveal signs of infection, inflammation, and discharge in the vagina and the surrounding  tissues. It can also help to identify painful areas. You may also have tests, including: A pregnancy test. Blood tests. A urine test. Culture tests of the vagina and cervix to check for an STI. Ultrasound. A laparoscopic procedure to look inside the pelvis. Biopsies of the lining of the uterus (endometrial biopsy). How is this treated? This condition may be treated with: Antibiotic medicines taken by mouth (orally). For more severe cases, antibiotics may be given through an IV at the hospital. Efforts to stop the spread of the infection. Sexual partners may need to be treated if the infection is caused by an STI. Surgery. This is rare. Surgery may be needed if other treatments do not help. It may take weeks until you are completely well. Your health care provider may test you for infection again 3 months after treatment. If you are diagnosed with PID, you should also be checked for HIV (human immunodeficiency virus). Follow these instructions at home: Take over-the-counter and prescription medicines only as told by your health care provider. If you were prescribed an antibiotic medicine, take it as told by your health care provider. Do not stop using the antibiotic even if you start to feel better. Do not have sex until treatment is completed or as told by your health care provider. If PID is confirmed, your recent sexual partners will need treatment, especially if you had unprotected sex. Keep all follow-up visits. This is important. Contact a health care provider if: You have increased or abnormal vaginal discharge. Your pain does not improve. You have a fever or chills. You cannot tolerate your medicines. Your partner has an STI. You have pain when you urinate. Get help  right away if: You have increased abdominal or pelvic pain. Your symptoms are getting worse. Your symptoms are not better in 72 hours with treatment. Summary Pelvic inflammatory disease (PID) is caused by an  infection in some or all of the female reproductive organs. PID is a serious infection because it can lead to lasting (chronic) pelvic pain or the inability to have children (infertility). This infection is usually treated with antibiotic medicines. Do not have sex until treatment is completed or as told by your health care provider. This information is not intended to replace advice given to you by your health care provider. Make sure you discuss any questions you have with your health care provider. Document Revised: 09/19/2021 Document Reviewed: 01/04/2021 Elsevier Patient Education  2024 [employer].

## 2024-02-26 ENCOUNTER — Encounter: Payer: Self-pay | Admitting: Family Medicine

## 2024-02-27 ENCOUNTER — Ambulatory Visit: Admitting: Family Medicine

## 2024-02-27 ENCOUNTER — Encounter: Payer: Self-pay | Admitting: Family Medicine

## 2024-02-27 VITALS — BP 120/82 | HR 85 | Temp 98.5°F | Ht 65.5 in | Wt 303.2 lb

## 2024-02-27 DIAGNOSIS — G43109 Migraine with aura, not intractable, without status migrainosus: Secondary | ICD-10-CM

## 2024-02-27 DIAGNOSIS — I10 Essential (primary) hypertension: Secondary | ICD-10-CM

## 2024-02-27 DIAGNOSIS — Z8249 Family history of ischemic heart disease and other diseases of the circulatory system: Secondary | ICD-10-CM | POA: Insufficient documentation

## 2024-02-27 DIAGNOSIS — F32A Depression, unspecified: Secondary | ICD-10-CM

## 2024-02-27 DIAGNOSIS — N739 Female pelvic inflammatory disease, unspecified: Secondary | ICD-10-CM | POA: Diagnosis not present

## 2024-02-27 DIAGNOSIS — E282 Polycystic ovarian syndrome: Secondary | ICD-10-CM

## 2024-02-27 DIAGNOSIS — F419 Anxiety disorder, unspecified: Secondary | ICD-10-CM

## 2024-02-27 DIAGNOSIS — N809 Endometriosis, unspecified: Secondary | ICD-10-CM | POA: Diagnosis not present

## 2024-02-27 DIAGNOSIS — E7800 Pure hypercholesterolemia, unspecified: Secondary | ICD-10-CM

## 2024-02-27 DIAGNOSIS — D352 Benign neoplasm of pituitary gland: Secondary | ICD-10-CM

## 2024-02-27 MED ORDER — ROSUVASTATIN CALCIUM 10 MG PO TABS: 10.0000 mg | ORAL_TABLET | Freq: Every day | ORAL | 1 refills | Status: DC

## 2024-02-27 NOTE — Patient Instructions (Signed)
Rosuvastatin Tablets What is this medication? ROSUVASTATIN (roe SOO va sta tin) treats high cholesterol and reduces the risk of heart attack and stroke. It works by decreasing bad cholesterol and fats (such as LDL, triglycerides) and increasing good cholesterol (HDL) in your blood. It belongs to a group of medications called statins. Changes to diet and exercise are often combined with this medication. This medicine may be used for other purposes; ask your health care provider or pharmacist if you have questions. COMMON BRAND NAME(S): Crestor What should I tell my care team before I take this medication? They need to know if you have any of these conditions: Diabetes Frequently drink alcohol Kidney disease Liver disease Muscle cramps, pain Stroke Thyroid disease An unusual or allergic reaction to rosuvastatin, other medications, foods, dyes, or preservatives Pregnant or trying to get pregnant Breastfeeding How should I use this medication? Take this medication by mouth with a glass of water. Follow the directions on the prescription label. You can take it with or without food. If it upsets your stomach, take it with food. Do not cut, crush or chew this medication. Swallow the tablets whole. Take your medication at regular intervals. Do not take it more often than directed. Take antacids that have a combination of aluminum and magnesium hydroxide in them at a different time of day than this medication. Take these products 2 hours AFTER this medication. Talk to your care team about the use of this medication in children. While this medication may be prescribed for children as young as 7 for selected conditions, precautions do apply. Overdosage: If you think you have taken too much of this medicine contact a poison control center or emergency room at once. NOTE: This medicine is only for you. Do not share this medicine with others. What if I miss a dose? If you miss a dose, take it as soon as  you can. If your next dose is to be taken in less than 12 hours, then do not take the missed dose. Take the next dose at your regular time. Do not take double or extra doses. What may interact with this medication? Do not take this medication with any of the following: Supplements like red yeast rice This medication may also interact with the following: Alcohol Antacids containing aluminum hydroxide and magnesium hydroxide Cyclosporine Other medications for high cholesterol Some medications for HIV infection Warfarin This list may not describe all possible interactions. Give your health care provider a list of all the medicines, herbs, non-prescription drugs, or dietary supplements you use. Also tell them if you smoke, drink alcohol, or use illegal drugs. Some items may interact with your medicine. What should I watch for while using this medication? Visit your care team for regular checks on your progress. Tell your care team if your symptoms do not start to get better or if they get worse. Your care team may tell you to stop taking this medication if you develop muscle problems. If your muscle problems do not go away after stopping this medication, contact your care team. Talk to your care team if you may be pregnant. Serious birth defects can occur if you take this medication during pregnancy. Talk to your care team before breastfeeding. Changes to your treatment plan may be needed. This medication may increase blood sugar. Ask your care team if changes in diet or medications are needed if you have diabetes. If you are going to need surgery or other procedure, tell your care team that you  are using this medication. Taking this medication is only part of a total heart healthy program. Ask your care team if there are other changes you can make to improve your overall health. What side effects may I notice from receiving this medication? Side effects that you should report to your care team as  soon as possible: Allergic reactions--skin rash, itching, hives, swelling of the face, lips, tongue, or throat High blood sugar (hyperglycemia)--increased thirst or amount of urine, unusual weakness, fatigue, blurry vision Liver injury--right upper belly pain, loss of appetite, nausea, light-colored stool, dark yellow or brown urine, yellowing skin or eyes, unusual weakness, fatigue Muscle injury--unusual weakness, fatigue, muscle pain, dark yellow or brown urine, decrease in amount of urine Redness, blistering, peeling or loosening of the skin, including inside the mouth Side effects that usually do not require medical attention (report to your care team if they continue or are bothersome): Fatigue Headache Nausea Stomach pain This list may not describe all possible side effects. Call your doctor for medical advice about side effects. You may report side effects to FDA at 1-800-FDA-1088. Where should I keep my medication? Keep out of the reach of children and pets. Store between 20 and 25 degrees C (68 and 77 degrees F). Get rid of any unused medication after the expiration date. To get rid of medications that are no longer needed or have expired: Take the medication to a medication take-back program. Check with your pharmacy or law enforcement to find a location. If you cannot return the medication, check the label or package insert to see if the medication should be thrown out in the garbage or flushed down the toilet. If you are not sure, ask your care team. If it is safe to put it in the trash, take the medication out of the container. Mix the medication with cat litter, dirt, coffee grounds, or other unwanted substance. Seal the mixture in a bag or container. Put it in the trash. NOTE: This sheet is a summary. It may not cover all possible information. If you have questions about this medicine, talk to your doctor, pharmacist, or health care provider.  2024 Elsevier/Gold Standard  (2022-01-25 00:00:00)

## 2024-02-27 NOTE — Telephone Encounter (Signed)
Addressed during patient's visit today

## 2024-02-27 NOTE — Progress Notes (Unsigned)
 Subjective:     Patient ID: Tara Singleton, female    DOB: Jan 21, 1986, 38 y.o.   MRN: [id number]  No chief complaint on file.   HPI   History of Present Illness          She has been going to [facility] for neurology and gynecology. She is requesting to establish with these specialists here in town.   She sees neurology for pituitary tumor and migraines  Last at her [facility] neurologist August 2024   PID- gynecology  ?hysterectomy and get IUD out   Hx of miscarriages   Dr. Bert Britain through [facility] is her psychiatrist   HLD and she would prefer to start on statin therapy.  Parents with high cholesterol  Mother with heart attack in her 83s and stroke at age 82    Health Maintenance Due  Topic Date Due   HIV Screening  Never done   Hepatitis C Screening  Never done   HPV VACCINES (1 - 3-dose SCDM series) Never done   Pneumococcal Vaccine 50-60 Years old (2 of 2 - PCV) 10/10/2017   COVID-19 Vaccine (4 - 2024-25 season) 05/11/2023    Past Medical History:  Diagnosis Date   Allergy    Age 51   Anxiety    Arthritis    Age 68 via MRI knee   Asthma    B12 deficiency    Constipation    Depression    Endometriosis    GERD (gastroesophageal reflux disease)    Headache    Infertility, female    PCOS (polycystic ovarian syndrome)    Sleep apnea    Swelling of lower extremity     Past Surgical History:  Procedure Laterality Date   EXTRACORPOREAL SHOCK WAVE LITHOTRIPSY Left 01/28/2022   Procedure: EXTRACORPOREAL SHOCK WAVE LITHOTRIPSY (ESWL);  Surgeon: Florencio Hunting, MD;  Location: [facility];  Service: Urology;  Laterality: Left;    Family History  Problem Relation Age of Onset   High blood pressure Mother    High Cholesterol Mother    Stroke Mother    Thyroid disease Mother    Cancer Mother    Depression Mother    Anxiety disorder Mother    Obesity Mother    Hypertension Mother    Asthma Father    Anxiety disorder Father    Sleep apnea Father     Alcoholism Father    Drug abuse Father    Obesity Father    Alcohol abuse Father    Cancer Maternal Grandmother    Obesity Maternal Grandmother    Obesity Paternal Grandfather    COPD Paternal Grandmother    Obesity Paternal Grandmother     Social History   Socioeconomic History   Marital status: Married    Spouse name: Not on file   Number of children: Not on file   Years of education: Not on file   Highest education level: Some college, no degree  Occupational History   Not on file  Tobacco Use   Smoking status: Never   Smokeless tobacco: Never  Substance and Sexual Activity   Alcohol use: Never   Drug use: Never   Sexual activity: Not on file  Other Topics Concern   Not on file  Social History Narrative   Not on file   Social Drivers of Health   Financial Resource Strain: Medium Risk (02/26/2024)   Overall Financial Resource Strain (CARDIA)    Difficulty of Paying Living Expenses: Somewhat hard  Food  Insecurity: No Food Insecurity (02/26/2024)   Hunger Vital Sign    Worried About Running Out of Food in the Last Year: Never true    Ran Out of Food in the Last Year: Never true  Transportation Needs: No Transportation Needs (02/26/2024)   PRAPARE - Administrator, Civil Service (Medical): No    Lack of Transportation (Non-Medical): No  Physical Activity: Insufficiently Active (02/26/2024)   Exercise Vital Sign    Days of Exercise per Week: 3 days    Minutes of Exercise per Session: 10 min  Stress: Stress Concern Present (02/26/2024)   [employer] of Occupational Health - Occupational Stress Questionnaire    Feeling of Stress: Rather much  Social Connections: Moderately Isolated (02/26/2024)   Social Connection and Isolation Panel    Frequency of Communication with Friends and Family: Three times a week    Frequency of Social Gatherings with Friends and Family: Never    Attends Religious Services: Never    Database administrator or Organizations:  No    Attends Engineer, structural: Not on file    Marital Status: Married  Catering manager Violence: Not At Risk (10/31/2022)   Received from [facility]   HITS    Over the last 12 months how often did your partner physically hurt you?: Never    Over the last 12 months how often did your partner insult you or talk down to you?: Never    Over the last 12 months how often did your partner threaten you with physical harm?: Never    Over the last 12 months how often did your partner scream or curse at you?: Never    Outpatient Medications Prior to Visit  Medication Sig Dispense Refill   ABILIFY 5 MG tablet      azithromycin (ZITHROMAX) 250 MG tablet Take 2 tablets on day 1, then 1 tablet daily on days 2 through 5 6 tablet 0   cetirizine (ZYRTEC) 10 MG tablet Take by mouth.     dicyclomine  (BENTYL ) 20 MG tablet Take 1 tablet (20 mg total) by mouth 2 (two) times daily. 20 tablet 0   EPINEPHrine  0.3 mg/0.3 mL IJ SOAJ injection Inject 0.3 mg into the muscle as needed for anaphylaxis. 1 each 0   FLUoxetine (PROZAC) 40 MG capsule Take 40 mg by mouth daily.     lamoTRIgine (LAMICTAL) 25 MG tablet Take 25 mg by mouth daily.     mometasone (NASONEX) 50 MCG/ACT nasal spray Place 2 sprays into the nose daily.     ondansetron  (ZOFRAN -ODT) 4 MG disintegrating tablet Take 4 mg by mouth every 12 (twelve) hours.     PROAIR HFA 108 (90 Base) MCG/ACT inhaler Inhale into the lungs.     rizatriptan (MAXALT) 10 MG tablet Take by mouth.     Semaglutide -Weight Management (WEGOVY ) 0.5 MG/0.5ML SOAJ Inject 0.5 mg into the skin once a week. 2 mL 0   verapamil (CALAN-SR) 120 MG CR tablet Take 120 mg by mouth at bedtime.     Vitamin D , Ergocalciferol , (DRISDOL ) 1.25 MG (50000 UNIT) CAPS capsule Take 1 capsule (50,000 Units total) by mouth every 7 (seven) days. 6 capsule 1   ALPRAZolam  (XANAX ) 0.25 MG tablet Take 1 tablet (0.25 mg total) by mouth 2 (two) times daily as needed for anxiety. 20 tablet 0    cholecalciferol (VITAMIN D3) 25 MCG (1000 UNIT) tablet Take 1,000 Units by mouth daily.     doxycycline  (VIBRAMYCIN ) 100 MG  capsule Take 1 capsule (100 mg total) by mouth 2 (two) times daily for 14 days. 28 capsule 0   metroNIDAZOLE  (FLAGYL ) 500 MG tablet Take 1 tablet (500 mg total) by mouth 2 (two) times daily for 14 days. 28 tablet 0   No facility-administered medications prior to visit.    Allergies  Allergen Reactions   Avocado Hives, Nausea And Vomiting, Rash, Shortness Of Breath and Swelling   Betula Alba Oil Anaphylaxis   Kiwi Extract Dermatitis, Hives, Rash and Shortness Of Breath   Shellfish Allergy Anaphylaxis   Sulfa Antibiotics Hives, Other (See Comments) and Rash    Upset stomach    White Birch Anaphylaxis    Birch trees   Latex Hives   Sulfur Other (See Comments)    Welts   Avocado Extract Allergy Skin Test Hives   Macrobid [Nitrofurantoin] Hives   Mango Flavoring Agent (Non-Screening) Hives    ROS     Objective:    Physical Exam   BP 120/82 (BP Location: Left Arm, Patient Position: Sitting)   Pulse 85   Temp 98.5 F (36.9 C) (Temporal)   Ht 5' 5.5 (1.664 m)   Wt (!) 303 lb 3.2 oz (137.5 kg)   SpO2 98%   BMI 49.69 kg/m  Wt Readings from Last 3 Encounters:  02/27/24 (!) 303 lb 3.2 oz (137.5 kg)  01/27/24 (!) 304 lb (137.9 kg)  01/23/24 (!) 311 lb (141.1 kg)       Assessment & Plan:   Problem List Items Addressed This Visit     Anxiety and depression   Endometriosis, unspecified   Relevant Orders   Ambulatory referral to Obstetrics / Gynecology   Essential hypertension   Relevant Medications   rosuvastatin (CRESTOR) 10 MG tablet   Family history of early CAD   Relevant Medications   rosuvastatin (CRESTOR) 10 MG tablet   Hyperlipidemia   Relevant Medications   rosuvastatin (CRESTOR) 10 MG tablet   Migraine with aura and without status migrainosus, not intractable   Relevant Medications   rosuvastatin (CRESTOR) 10 MG tablet   Other  Relevant Orders   Ambulatory referral to Neurology   PCOS (polycystic ovarian syndrome)   Relevant Orders   Ambulatory referral to Obstetrics / Gynecology   PID (pelvic inflammatory disease) - Primary   Relevant Orders   Ambulatory referral to Obstetrics / Gynecology   Pituitary adenoma [facility])   Relevant Orders   Ambulatory referral to Neurology   Severe obesity (BMI >= 40) (HCC)    I have discontinued Tara Singleton's cholecalciferol, ALPRAZolam , doxycycline , and metroNIDAZOLE . I am also having her start on rosuvastatin. Additionally, I am having her maintain her ProAir HFA, cetirizine, verapamil, rizatriptan, FLUoxetine, ondansetron , mometasone, Abilify, EPINEPHrine , Vitamin D  (Ergocalciferol ), lamoTRIgine, Wegovy , dicyclomine , and azithromycin.  Meds ordered this encounter  Medications   rosuvastatin (CRESTOR) 10 MG tablet    Sig: Take 1 tablet (10 mg total) by mouth daily.    Dispense:  90 tablet    Refill:  1    Supervising Provider:   Bambi Lever A [[id number]]

## 2024-03-01 NOTE — Assessment & Plan Note (Signed)
 Seen on imaging of brain in 2019.  Previously followed by neurologist in [facility].  Referral to local neurologist per patient request.

## 2024-03-01 NOTE — Assessment & Plan Note (Signed)
 Continue seeing [facility] and Wegovy .  Work on [employer] and exercise.

## 2024-03-01 NOTE — Assessment & Plan Note (Signed)
 Start statin therapy.  Follow-up for fasting lipids in approximately 2 months.

## 2024-03-01 NOTE — Assessment & Plan Note (Signed)
 Previously managed by neurologist in [place].  She would like to establish with a local neurologist.  Referral made.

## 2024-03-01 NOTE — Assessment & Plan Note (Signed)
 Has psychiatrist that monitors medication and response.  Feels well managed at this time.  No SI or HI.  Continue current treatment plan

## 2024-03-01 NOTE — Assessment & Plan Note (Signed)
Controlled. On Verapamil. Last renal function normal

## 2024-03-01 NOTE — Assessment & Plan Note (Signed)
 Family history significant for hyperlipidemia and early CAD.  She prefers to start on statin therapy.  Rosuvastatin  10 mg sent to her pharmacy.  Recommend low-fat, low-cholesterol diet.  Continue weight loss.  Continue Wegovy  per [facility].  check fasting lipids at her follow-up in 2 months.

## 2024-03-01 NOTE — Assessment & Plan Note (Signed)
 Referral to local OB/GYN.  She was previously going to someone in [place].

## 2024-03-01 NOTE — Assessment & Plan Note (Signed)
 Referral to OB/GYN

## 2024-03-05 DIAGNOSIS — F330 Major depressive disorder, recurrent, mild: Secondary | ICD-10-CM | POA: Diagnosis not present

## 2024-03-08 DIAGNOSIS — F330 Major depressive disorder, recurrent, mild: Secondary | ICD-10-CM | POA: Diagnosis not present

## 2024-03-09 ENCOUNTER — Encounter (INDEPENDENT_AMBULATORY_CARE_PROVIDER_SITE_OTHER): Payer: Self-pay | Admitting: Family Medicine

## 2024-03-09 ENCOUNTER — Ambulatory Visit (INDEPENDENT_AMBULATORY_CARE_PROVIDER_SITE_OTHER): Admitting: Family Medicine

## 2024-03-09 VITALS — BP 104/70 | HR 91 | Temp 97.9°F | Ht 65.5 in | Wt 297.0 lb

## 2024-03-09 DIAGNOSIS — R102 Pelvic and perineal pain: Secondary | ICD-10-CM | POA: Diagnosis not present

## 2024-03-09 DIAGNOSIS — N739 Female pelvic inflammatory disease, unspecified: Secondary | ICD-10-CM

## 2024-03-09 DIAGNOSIS — Z6841 Body Mass Index (BMI) 40.0 and over, adult: Secondary | ICD-10-CM | POA: Diagnosis not present

## 2024-03-09 DIAGNOSIS — N730 Acute parametritis and pelvic cellulitis: Secondary | ICD-10-CM

## 2024-03-09 MED ORDER — WEGOVY 1 MG/0.5ML ~~LOC~~ SOAJ: 1.0000 mg | SUBCUTANEOUS | 0 refills | Status: DC

## 2024-03-09 NOTE — Progress Notes (Signed)
 SUBJECTIVE:  Chief Complaint: Obesity  Interim History: Since last appointment patient has restarted school and has occasionally had to eat what was available vs following plan.  Occasionally has had to eat at work and eat what is available.  Hasn't done logging due to interface. Does report having to take care of a few people in her life.  In the next few weeks she is starting a semester.  She will be in [place] in a few weeks and is hopeful to make more mindful choices when there. She is walking 8k during work.   Tara Singleton is here to discuss her progress with her obesity treatment plan. She is on the keeping a food journal and adhering to recommended goals of 1400-1700 calories and 100 grams of protein and states she is following her eating plan approximately 80 % of the time. She states she is walking 8,000 steps 5 times per week.   OBJECTIVE: Visit Diagnoses: Problem List Items Addressed This Visit       Genitourinary   PID (pelvic inflammatory disease)   Patient recently seen and evaluated for pelvic pain.  Given antibiotics for possible PID; all tests negative.  Discussed further evaluation for pelvic pain and discussed gyn options in th neighborhood.        Other   Morbid obesity (HCC)   Anthropometric Measurements Height: 5' 5.5 (1.664 m) Weight: 297 lb (134.7 kg) BMI (Calculated): 48.65 Weight at Last Visit: 304 lb Weight Lost Since Last Visit: 7 Weight Gained Since Last Visit: 0 Starting Weight: 300 lb Total Weight Loss (lbs): 3 lb (1.361 kg) Body Composition  Body Fat %: 55.7 % Fat Mass (lbs): 165.8 lbs Muscle Mass (lbs): 125.2 lbs Visceral Fat Rating : 18 Other Clinical Data Fasting: yes Labs: no Today's Visit #: 3 Starting Date: 09/01/23 Comments: 1400-1700/100       Relevant Medications   Semaglutide -Weight Management (WEGOVY ) 1 MG/0.5ML SOAJ   Other Visit Diagnoses       PID (acute pelvic inflammatory disease)    -  Primary     BMI 45.0-49.9,  adult (HCC) Current BMI 49.3       Relevant Medications   Semaglutide -Weight Management (WEGOVY ) 1 MG/0.5ML SOAJ       No data recorded       03/09/2024    8:00 AM 02/27/2024    1:18 PM 02/18/2024    8:28 AM  Vitals with BMI  Height 5' 5.5 5' 5.5   Weight 297 lbs 303 lbs 3 oz   BMI 48.65 49.67   Systolic 104 120 873  Diastolic 70 82 80  Pulse 91 85 71      ASSESSMENT AND PLAN:  Diet: Tara Singleton is currently in the action stage of change. As such, her goal is to continue with weight loss efforts and has agreed to keeping a food journal and adhering to recommended goals of 1500-1700 calories and 100 or more grams of protein.   Exercise:  For substantial health benefits, adults should do at least 150 minutes (2 hours and 30 minutes) a week of moderate-intensity, or 75 minutes (1 hour and 15 minutes) a week of vigorous-intensity aerobic physical activity, or an equivalent combination of moderate- and vigorous-intensity aerobic activity. Aerobic activity should be performed in episodes of at least 10 minutes, and preferably, it should be spread throughout the week.  Behavior Modification:  We discussed the following Behavioral Modification Strategies today: increasing lean protein intake, decreasing simple carbohydrates, increasing vegetables, planning for success,  and keep a strict food journal. We discussed various medication options to help Tara Singleton with her weight loss efforts and we both agreed to increase Wegovy  to 1mg  weekly.  Return in about 2 weeks (around 03/23/2024).   She was informed of the importance of frequent follow up visits to maximize her success with intensive lifestyle modifications for her multiple health conditions.  Attestation Statements:   Reviewed by clinician on day of visit: allergies, medications, problem list, medical history, surgical history, family history, social history, and previous encounter notes.     Tara Cho, MD

## 2024-03-19 DIAGNOSIS — F330 Major depressive disorder, recurrent, mild: Secondary | ICD-10-CM | POA: Diagnosis not present

## 2024-03-20 NOTE — Assessment & Plan Note (Signed)
 Patient recently seen and evaluated for pelvic pain.  Given antibiotics for possible PID; all tests negative.  Discussed further evaluation for pelvic pain and discussed gyn options in th neighborhood.

## 2024-03-20 NOTE — Assessment & Plan Note (Signed)
 Anthropometric Measurements Height: 5' 5.5 (1.664 m) Weight: 297 lb (134.7 kg) BMI (Calculated): 48.65 Weight at Last Visit: 304 lb Weight Lost Since Last Visit: 7 Weight Gained Since Last Visit: 0 Starting Weight: 300 lb Total Weight Loss (lbs): 3 lb (1.361 kg) Body Composition  Body Fat %: 55.7 % Fat Mass (lbs): 165.8 lbs Muscle Mass (lbs): 125.2 lbs Visceral Fat Rating : 18 Other Clinical Data Fasting: yes Labs: no Today's Visit #: 3 Starting Date: 09/01/23 Comments: 1400-1700/100

## 2024-03-22 ENCOUNTER — Ambulatory Visit (INDEPENDENT_AMBULATORY_CARE_PROVIDER_SITE_OTHER): Admitting: Internal Medicine

## 2024-03-25 ENCOUNTER — Encounter (INDEPENDENT_AMBULATORY_CARE_PROVIDER_SITE_OTHER): Payer: Self-pay | Admitting: Family Medicine

## 2024-03-25 ENCOUNTER — Ambulatory Visit (INDEPENDENT_AMBULATORY_CARE_PROVIDER_SITE_OTHER): Admitting: Family Medicine

## 2024-03-25 VITALS — BP 115/72 | HR 76 | Temp 97.8°F | Ht 65.5 in | Wt 295.0 lb

## 2024-03-25 DIAGNOSIS — Z6841 Body Mass Index (BMI) 40.0 and over, adult: Secondary | ICD-10-CM | POA: Diagnosis not present

## 2024-03-25 DIAGNOSIS — E559 Vitamin D deficiency, unspecified: Secondary | ICD-10-CM

## 2024-03-25 DIAGNOSIS — E7800 Pure hypercholesterolemia, unspecified: Secondary | ICD-10-CM | POA: Diagnosis not present

## 2024-03-25 MED ORDER — WEGOVY 1 MG/0.5ML ~~LOC~~ SOAJ: 1.0000 mg | SUBCUTANEOUS | 0 refills | Status: DC

## 2024-03-25 MED ORDER — VITAMIN D (ERGOCALCIFEROL) 1.25 MG (50000 UNIT) PO CAPS: 50000.0000 [IU] | ORAL_CAPSULE | ORAL | 0 refills | Status: AC

## 2024-03-25 NOTE — Progress Notes (Signed)
 SUBJECTIVE:  Chief Complaint: Obesity  Interim History: Patient has been working on staying within her calorie budget daily and getting around 50g of protein daily.  This is a total that includes protein bars or a shake.  She hasn't really thought about substitution options for the foods she is making.  She is thinking she may be taking in less nutritious calories.  She is going to [facility] this week- she thinks she this may provide additional challenges.    Tara Singleton is here to discuss her progress with her obesity treatment plan. She is on the keeping a food journal and adhering to recommended goals of 1500-1700 calories and 100 grams of protein and states she is following her eating plan approximately 100 % of the time. She states she is walking more.   OBJECTIVE: Visit Diagnoses: Problem List Items Addressed This Visit       Other   Vitamin D  deficiency - Primary   Relevant Medications   Vitamin D , Ergocalciferol , (DRISDOL ) 1.25 MG (50000 UNIT) CAPS capsule   Morbid obesity (HCC)   Relevant Medications   Semaglutide -Weight Management (WEGOVY ) 1 MG/0.5ML SOAJ   Other Visit Diagnoses       Elevated cholesterol         BMI 45.0-49.9, adult (HCC) Current BMI 49.3       Relevant Medications   Semaglutide -Weight Management (WEGOVY ) 1 MG/0.5ML SOAJ       Vitals Temp: 97.8 F (36.6 C) BP: 115/72 Pulse Rate: 76 SpO2: 99 %   Anthropometric Measurements Height: 5' 5.5 (1.664 m) Weight: 295 lb (133.8 kg) BMI (Calculated): 48.33 Weight at Last Visit: 297 lb Weight Lost Since Last Visit: 2 Weight Gained Since Last Visit: 0 Starting Weight: 300 lb Total Weight Loss (lbs): 5 lb (2.268 kg)   Body Composition  Body Fat %: 55.6 % Fat Mass (lbs): 164.2 lbs Muscle Mass (lbs): 124.6 lbs Visceral Fat Rating : 18   Other Clinical Data Today's Visit #: 4 Starting Date: 09/01/23 Comments: 1400-1700/100     ASSESSMENT AND PLAN: Assessment & Plan Vitamin D   deficiency On prescription strength vitamin D .  Doing well with no nausea, vomiting or muscle weakness.  Needs a refill today. Elevated cholesterol Elevated LDL in May with controlled triglycerides.  Patient working on controlling saturated fat to 20% or less of total intake.  Will redraw labs in 3 months. Morbid obesity (HCC)  BMI 45.0-49.9, adult (HCC) Current BMI 49.3    Diet: Tara Singleton is currently in the action stage of change. As such, her goal is to continue with weight loss efforts and has agreed to keeping a food journal and adhering to recommended goals of 1500-1700 calories and 100 or more grams of protein daily.   Exercise:  For substantial health benefits, adults should do at least 150 minutes (2 hours and 30 minutes) a week of moderate-intensity, or 75 minutes (1 hour and 15 minutes) a week of vigorous-intensity aerobic physical activity, or an equivalent combination of moderate- and vigorous-intensity aerobic activity. Aerobic activity should be performed in episodes of at least 10 minutes, and preferably, it should be spread throughout the week.  Behavior Modification:  We discussed the following Behavioral Modification Strategies today: increasing lean protein intake, decreasing simple carbohydrates, meal planning and cooking strategies, planning for success, and keep a strict food journal. We discussed various medication options to help Tara Singleton with her weight loss efforts and we both agreed to continue Wegovy .  Return in about 4 weeks (around 04/22/2024).  She was informed of the importance of frequent follow up visits to maximize her success with intensive lifestyle modifications for her multiple health conditions.  Attestation Statements:   Reviewed by clinician on day of visit: allergies, medications, problem list, medical history, surgical history, family history, social history, and previous encounter notes.     Adelita Cho, MD

## 2024-03-26 ENCOUNTER — Ambulatory Visit: Admitting: Family Medicine

## 2024-03-26 ENCOUNTER — Encounter: Payer: Self-pay | Admitting: Family Medicine

## 2024-03-26 VITALS — BP 116/72 | HR 88 | Temp 97.8°F | Ht 65.5 in | Wt 298.0 lb

## 2024-03-26 DIAGNOSIS — G4730 Sleep apnea, unspecified: Secondary | ICD-10-CM | POA: Diagnosis not present

## 2024-03-26 DIAGNOSIS — D352 Benign neoplasm of pituitary gland: Secondary | ICD-10-CM | POA: Diagnosis not present

## 2024-03-26 DIAGNOSIS — G43109 Migraine with aura, not intractable, without status migrainosus: Secondary | ICD-10-CM | POA: Diagnosis not present

## 2024-03-26 DIAGNOSIS — I10 Essential (primary) hypertension: Secondary | ICD-10-CM

## 2024-03-26 DIAGNOSIS — E7800 Pure hypercholesterolemia, unspecified: Secondary | ICD-10-CM

## 2024-03-26 MED ORDER — RIZATRIPTAN BENZOATE 10 MG PO TABS: 10.0000 mg | ORAL_TABLET | ORAL | 0 refills | Status: DC | PRN

## 2024-03-26 MED ORDER — ONDANSETRON 4 MG PO TBDP
4.0000 mg | ORAL_TABLET | Freq: Two times a day (BID) | ORAL | 1 refills | Status: AC
Start: 2024-03-26 — End: ?

## 2024-03-26 MED ORDER — VERAPAMIL HCL ER 120 MG PO TBCR: 120.0000 mg | EXTENDED_RELEASE_TABLET | Freq: Every day | ORAL | 4 refills | Status: DC

## 2024-03-26 NOTE — Progress Notes (Signed)
 Subjective:     Patient ID: Tara Singleton, female    DOB: [birth year], 38 y.o.   MRN: [id number]  Chief Complaint  Patient presents with   fmla    HPI   History of Present Illness          Currently does not have a neurologist and has migraines. More frequent lately with increased stress. 5 per month.  Needs refill of medications.  She is also requesting FMLA forms due to migraine headaches which prevents her from being able to work when she has them.   Mild osa and is not using CPAP  HLD and family hx of cardiac disease. She is taking a statin.       Health Maintenance Due  Topic Date Due   HIV Screening  Never done   Hepatitis C Screening  Never done   Hepatitis B Vaccines (1 of 3 - 19+ 3-dose series) Never done   HPV VACCINES (1 - 3-dose SCDM series) Never done   Pneumococcal Vaccine 43-11 Years old (2 of 2 - PCV) 10/10/2017   COVID-19 Vaccine (4 - 2024-25 season) 05/11/2023    Past Medical History:  Diagnosis Date   Allergy    Age 38   Anxiety    Arthritis    Age 29 via MRI knee   Asthma    B12 deficiency    Constipation    Depression    Endometriosis    GERD (gastroesophageal reflux disease)    Headache    Infertility, female    PCOS (polycystic ovarian syndrome)    Sleep apnea    Swelling of lower extremity     Past Surgical History:  Procedure Laterality Date   EXTRACORPOREAL SHOCK WAVE LITHOTRIPSY Left 01/28/2022   Procedure: EXTRACORPOREAL SHOCK WAVE LITHOTRIPSY (ESWL);  Surgeon: Renda Glance, MD;  Location: [facility];  Service: Urology;  Laterality: Left;    Family History  Problem Relation Age of Onset   High blood pressure Mother    High Cholesterol Mother    Stroke Mother    Thyroid disease Mother    Cancer Mother    Depression Mother    Anxiety disorder Mother    Obesity Mother    Hypertension Mother    Asthma Father    Anxiety disorder Father    Sleep apnea Father    Alcoholism Father    Drug abuse  Father    Obesity Father    Alcohol abuse Father    Cancer Maternal Grandmother    Obesity Maternal Grandmother    Obesity Paternal Grandfather    COPD Paternal Grandmother    Obesity Paternal Grandmother     Social History   Socioeconomic History   Marital status: Married    Spouse name: Not on file   Number of children: Not on file   Years of education: Not on file   Highest education level: Some college, no degree  Occupational History   Not on file  Tobacco Use   Smoking status: Never   Smokeless tobacco: Never  Substance and Sexual Activity   Alcohol use: Never   Drug use: Never   Sexual activity: Not on file  Other Topics Concern   Not on file  Social History Narrative   Not on file   Social Drivers of Health   Financial Resource Strain: Medium Risk (02/26/2024)   Overall Financial Resource Strain (CARDIA)    Difficulty of Paying Living Expenses: Somewhat hard  Food Insecurity: No Food  Insecurity (02/26/2024)   Hunger Vital Sign    Worried About Running Out of Food in the Last Year: Never true    Ran Out of Food in the Last Year: Never true  Transportation Needs: No Transportation Needs (02/26/2024)   PRAPARE - Administrator, Civil Service (Medical): No    Lack of Transportation (Non-Medical): No  Physical Activity: Insufficiently Active (02/26/2024)   Exercise Vital Sign    Days of Exercise per Week: 3 days    Minutes of Exercise per Session: 10 min  Stress: Stress Concern Present (02/26/2024)   [employer] of Occupational Health - Occupational Stress Questionnaire    Feeling of Stress: Rather much  Social Connections: Moderately Isolated (02/26/2024)   Social Connection and Isolation Panel    Frequency of Communication with Friends and Family: Three times a week    Frequency of Social Gatherings with Friends and Family: Never    Attends Religious Services: Never    Database administrator or Organizations: No    Attends Hospital doctor: Not on file    Marital Status: Married  Catering manager Violence: Not At Risk (10/31/2022)   Received from [facility]   HITS    Over the last 12 months how often did your partner physically hurt you?: Never    Over the last 12 months how often did your partner insult you or talk down to you?: Never    Over the last 12 months how often did your partner threaten you with physical harm?: Never    Over the last 12 months how often did your partner scream or curse at you?: Never    Outpatient Medications Prior to Visit  Medication Sig Dispense Refill   ABILIFY 5 MG tablet      cetirizine (ZYRTEC) 10 MG tablet Take by mouth.     EPINEPHrine  0.3 mg/0.3 mL IJ SOAJ injection Inject 0.3 mg into the muscle as needed for anaphylaxis. 1 each 0   FLUoxetine (PROZAC) 40 MG capsule Take 40 mg by mouth daily.     lamoTRIgine (LAMICTAL) 100 MG tablet Take 100 mg by mouth daily.     mometasone (NASONEX) 50 MCG/ACT nasal spray Place 2 sprays into the nose daily.     PROAIR HFA 108 (90 Base) MCG/ACT inhaler Inhale into the lungs.     rosuvastatin  (CRESTOR ) 10 MG tablet Take 1 tablet (10 mg total) by mouth daily. 90 tablet 1   Semaglutide -Weight Management (WEGOVY ) 1 MG/0.5ML SOAJ Inject 1 mg into the skin once a week. 2 mL 0   Vitamin D , Ergocalciferol , (DRISDOL ) 1.25 MG (50000 UNIT) CAPS capsule Take 1 capsule (50,000 Units total) by mouth every 7 (seven) days. 12 capsule 0   ondansetron  (ZOFRAN -ODT) 4 MG disintegrating tablet Take 4 mg by mouth every 12 (twelve) hours.     rizatriptan (MAXALT) 10 MG tablet Take by mouth.     verapamil (CALAN-SR) 120 MG CR tablet Take 120 mg by mouth at bedtime.     lamoTRIgine (LAMICTAL) 25 MG tablet Take 25 mg by mouth daily. (Patient not taking: Reported on 03/26/2024)     No facility-administered medications prior to visit.    Allergies  Allergen Reactions   Avocado Hives, Nausea And Vomiting, Rash, Shortness Of Breath and Swelling   Betula Alba Oil  Anaphylaxis   Kiwi Extract Dermatitis, Hives, Rash and Shortness Of Breath   Shellfish Allergy Anaphylaxis   Sulfa Antibiotics Hives, Other (See Comments) and Rash  Upset stomach    White Birch Anaphylaxis    [place] trees   Latex Hives   Sulfur Other (See Comments)    Welts   Avocado Extract Allergy Skin Test Hives   Macrobid [Nitrofurantoin] Hives   Mango Flavoring Agent (Non-Screening) Hives    Review of Systems  Constitutional:  Negative for chills and fever.  HENT:  Negative for congestion and tinnitus.   Eyes:  Negative for blurred vision and double vision.  Respiratory:  Negative for cough and shortness of breath.   Cardiovascular:  Negative for chest pain, palpitations and leg swelling.  Gastrointestinal:  Positive for nausea. Negative for abdominal pain, constipation, diarrhea and vomiting.  Genitourinary:  Negative for dysuria, frequency and urgency.  Neurological:  Positive for headaches. Negative for dizziness, focal weakness and loss of consciousness.       Objective:    Physical Exam Constitutional:      General: She is not in acute distress.    Appearance: She is not ill-appearing.  Eyes:     Extraocular Movements: Extraocular movements intact.     Conjunctiva/sclera: Conjunctivae normal.  Cardiovascular:     Rate and Rhythm: Normal rate.  Pulmonary:     Effort: Pulmonary effort is normal.  Musculoskeletal:     Cervical back: Normal range of motion and neck supple.  Skin:    General: Skin is warm and dry.  Neurological:     General: No focal deficit present.     Mental Status: She is alert and oriented to person, place, and time.     Motor: No weakness.     Coordination: Coordination normal.     Gait: Gait normal.  Psychiatric:        Mood and Affect: Mood normal.        Behavior: Behavior normal.        Thought Content: Thought content normal.      BP 116/72 (BP Location: Left Arm, Patient Position: Sitting)   Pulse 88   Temp 97.8 F (36.6  C) (Temporal)   Ht 5' 5.5 (1.664 m)   Wt 298 lb (135.2 kg)   LMP 03/25/2024   SpO2 97%   BMI 48.84 kg/m  Wt Readings from Last 3 Encounters:  03/26/24 298 lb (135.2 kg)  03/25/24 295 lb (133.8 kg)  03/09/24 297 lb (134.7 kg)       Assessment & Plan:   Problem List Items Addressed This Visit     Essential hypertension   Relevant Medications   verapamil (CALAN-SR) 120 MG CR tablet   Hyperlipidemia   Relevant Medications   verapamil (CALAN-SR) 120 MG CR tablet   Migraine with aura and without status migrainosus, not intractable - Primary   Relevant Medications   verapamil (CALAN-SR) 120 MG CR tablet   rizatriptan (MAXALT) 10 MG tablet   Mild sleep apnea   Pituitary adenoma (HCC)   Reviewed notes and discussed migraine headaches today in order to fill out FMLA paperwork. She has a neurology appt but not until November. Previously seeing a neurologist at [facility].  Refilled medications and filled out forms.  She will follow up as scheduled fasting next month.   I have changed Tara Singleton's verapamil, ondansetron , and rizatriptan. I am also having her maintain her ProAir HFA, cetirizine, FLUoxetine, mometasone, Abilify, EPINEPHrine , lamoTRIgine, rosuvastatin , lamoTRIgine, Wegovy , and Vitamin D  (Ergocalciferol ).  Meds ordered this encounter  Medications   verapamil (CALAN-SR) 120 MG CR tablet    Sig: Take 1 tablet (120 mg  total) by mouth at bedtime.    Dispense:  30 tablet    Refill:  4    Supervising Provider:   ROLLENE NORRIS A [[id number]]   ondansetron  (ZOFRAN -ODT) 4 MG disintegrating tablet    Sig: Take 1 tablet (4 mg total) by mouth every 12 (twelve) hours.    Dispense:  20 tablet    Refill:  1    Supervising Provider:   ROLLENE NORRIS A [[id number]]   rizatriptan (MAXALT) 10 MG tablet    Sig: Take 1 tablet (10 mg total) by mouth as needed for migraine.    Dispense:  10 tablet    Refill:  0    Supervising Provider:   ROLLENE NORRIS A [[id number]]

## 2024-04-02 DIAGNOSIS — F330 Major depressive disorder, recurrent, mild: Secondary | ICD-10-CM | POA: Diagnosis not present

## 2024-04-06 ENCOUNTER — Other Ambulatory Visit (HOSPITAL_COMMUNITY)
Admission: RE | Admit: 2024-04-06 | Discharge: 2024-04-06 | Disposition: A | Discharge status: Home / Self Care | Source: Ambulatory Visit | Attending: Obstetrics & Gynecology | Admitting: Obstetrics & Gynecology

## 2024-04-06 ENCOUNTER — Ambulatory Visit: Admitting: Obstetrics & Gynecology

## 2024-04-06 ENCOUNTER — Encounter: Payer: Self-pay | Admitting: Obstetrics & Gynecology

## 2024-04-06 VITALS — BP 129/74 | HR 93 | Ht 65.5 in | Wt 294.0 lb

## 2024-04-06 DIAGNOSIS — N946 Dysmenorrhea, unspecified: Secondary | ICD-10-CM | POA: Diagnosis not present

## 2024-04-06 DIAGNOSIS — Z1151 Encounter for screening for human papillomavirus (HPV): Secondary | ICD-10-CM | POA: Insufficient documentation

## 2024-04-06 DIAGNOSIS — N803B3 Deep endometriosis of bilateral uterosacral ligament(s): Secondary | ICD-10-CM | POA: Diagnosis not present

## 2024-04-06 DIAGNOSIS — Z124 Encounter for screening for malignant neoplasm of cervix: Secondary | ICD-10-CM

## 2024-04-06 DIAGNOSIS — N941 Unspecified dyspareunia: Secondary | ICD-10-CM

## 2024-04-06 DIAGNOSIS — Z01419 Encounter for gynecological examination (general) (routine) without abnormal findings: Secondary | ICD-10-CM | POA: Insufficient documentation

## 2024-04-06 DIAGNOSIS — N938 Other specified abnormal uterine and vaginal bleeding: Secondary | ICD-10-CM

## 2024-04-06 DIAGNOSIS — N809 Endometriosis, unspecified: Secondary | ICD-10-CM | POA: Diagnosis not present

## 2024-04-06 MED ORDER — MEGESTROL ACETATE 40 MG PO TABS
ORAL_TABLET | ORAL | 3 refills | Status: DC
Start: 2024-04-06 — End: 2024-04-21

## 2024-04-06 NOTE — Assessment & Plan Note (Signed)
 On prescription strength vitamin D .  Doing well with no nausea, vomiting or muscle weakness.  Needs a refill today.

## 2024-04-06 NOTE — Progress Notes (Signed)
 Subjective:     Tara Singleton is a 38 y.o. female here for a routine exam.  Patient's last menstrual period was 03/25/2024 (exact date). [id number] Birth Control Method:  na Menstrual Calendar(currently): regular  Current complaints: pelvic pain, dysmenorrhea dyspareunia.   Current acute medical issues:  endometriosis   Recent Gynecologic History Patient's last menstrual period was 03/25/2024 (exact date). Last Pap: ,  normal Last mammogram: na,    Past Medical History:  Diagnosis Date   Allergy    Age 78   Anxiety    Arthritis    Age 4 via MRI knee   Asthma    B12 deficiency    Constipation    Depression    Endometriosis    GERD (gastroesophageal reflux disease)    Headache    Infertility, female    PCOS (polycystic ovarian syndrome)    Sleep apnea    Swelling of lower extremity     Past Surgical History:  Procedure Laterality Date   EXTRACORPOREAL SHOCK WAVE LITHOTRIPSY Left 01/28/2022   Procedure: EXTRACORPOREAL SHOCK WAVE LITHOTRIPSY (ESWL);  Surgeon: Renda Glance, MD;  Location: [facility];  Service: Urology;  Laterality: Left;    OB History     Gravida  5   Para      Term      Preterm      AB  5   Living         SAB  5   IAB      Ectopic      Multiple      Live Births              Social History   Socioeconomic History   Marital status: Married    Spouse name: Not on file   Number of children: Not on file   Years of education: Not on file   Highest education level: Some college, no degree  Occupational History   Not on file  Tobacco Use   Smoking status: Former    Types: Cigarettes   Smokeless tobacco: Never  Vaping Use   Vaping status: Every Day  Substance and Sexual Activity   Alcohol use: Never    Comment: occ   Drug use: Never   Sexual activity: Not Currently    Birth control/protection: I.U.D.  Other Topics Concern   Not on file  Social History Narrative   Not on file   Social Drivers of  Health   Financial Resource Strain: Medium Risk (04/06/2024)   Overall Financial Resource Strain (CARDIA)    Difficulty of Paying Living Expenses: Somewhat hard  Food Insecurity: No Food Insecurity (04/06/2024)   Hunger Vital Sign    Worried About Running Out of Food in the Last Year: Never true    Ran Out of Food in the Last Year: Never true  Transportation Needs: No Transportation Needs (04/06/2024)   PRAPARE - Administrator, Civil Service (Medical): No    Lack of Transportation (Non-Medical): No  Physical Activity: Insufficiently Active (04/06/2024)   Exercise Vital Sign    Days of Exercise per Week: 5 days    Minutes of Exercise per Session: 20 min  Stress: Stress Concern Present (04/06/2024)   [employer] of Occupational Health - Occupational Stress Questionnaire    Feeling of Stress: To some extent  Social Connections: Moderately Isolated (04/06/2024)   Social Connection and Isolation Panel    Frequency of Communication with Friends and Family: More than three times a  week    Frequency of Social Gatherings with Friends and Family: Once a week    Attends Religious Services: Never    Database administrator or Organizations: No    Attends Engineer, structural: Never    Marital Status: Married    Family History  Problem Relation Age of Onset   Obesity Paternal Grandfather    COPD Paternal Grandmother    Obesity Paternal Grandmother    Cancer Maternal Grandmother    Obesity Maternal Grandmother    Asthma Father    Anxiety disorder Father    Sleep apnea Father    Alcoholism Father    Drug abuse Father    Obesity Father    Alcohol abuse Father    High blood pressure Mother    High Cholesterol Mother    Stroke Mother    Thyroid disease Mother    Cancer Mother    Depression Mother    Anxiety disorder Mother    Obesity Mother    Hypertension Mother    Uterine cancer Mother      Current Outpatient Medications:    ABILIFY 5 MG tablet, , Disp: ,  Rfl:    cetirizine (ZYRTEC) 10 MG tablet, Take by mouth., Disp: , Rfl:    EPINEPHrine  0.3 mg/0.3 mL IJ SOAJ injection, Inject 0.3 mg into the muscle as needed for anaphylaxis., Disp: 1 each, Rfl: 0   FLUoxetine (PROZAC) 40 MG capsule, Take 40 mg by mouth daily., Disp: , Rfl:    lamoTRIgine (LAMICTAL) 100 MG tablet, Take 100 mg by mouth daily., Disp: , Rfl:    megestrol  (MEGACE ) 40 MG tablet, 3 tablets a day for 5 days, 2 tablets a day for 5 days then 1 tablet daily, Disp: 45 tablet, Rfl: 3   ondansetron  (ZOFRAN -ODT) 4 MG disintegrating tablet, Take 1 tablet (4 mg total) by mouth every 12 (twelve) hours., Disp: 20 tablet, Rfl: 1   PROAIR HFA 108 (90 Base) MCG/ACT inhaler, Inhale into the lungs., Disp: , Rfl:    rizatriptan  (MAXALT ) 10 MG tablet, Take 1 tablet (10 mg total) by mouth as needed for migraine., Disp: 10 tablet, Rfl: 0   rosuvastatin  (CRESTOR ) 10 MG tablet, Take 1 tablet (10 mg total) by mouth daily., Disp: 90 tablet, Rfl: 1   Semaglutide -Weight Management (WEGOVY ) 1 MG/0.5ML SOAJ, Inject 1 mg into the skin once a week., Disp: 2 mL, Rfl: 0   verapamil  (CALAN -SR) 120 MG CR tablet, Take 1 tablet (120 mg total) by mouth at bedtime., Disp: 30 tablet, Rfl: 4   Vitamin D , Ergocalciferol , (DRISDOL ) 1.25 MG (50000 UNIT) CAPS capsule, Take 1 capsule (50,000 Units total) by mouth every 7 (seven) days., Disp: 12 capsule, Rfl: 0   azithromycin  (ZITHROMAX ) 250 MG tablet, Take 2 tablets on day 1, then 1 tablet daily on days 2 through 5, Disp: 6 tablet, Rfl: 0   mometasone (NASONEX) 50 MCG/ACT nasal spray, Place 2 sprays into the nose daily., Disp: , Rfl:    predniSONE  (DELTASONE ) 20 MG tablet, Take 2 tablets (40 mg total) by mouth daily with breakfast., Disp: 10 tablet, Rfl: 0  Review of Systems  Review of Systems  Constitutional: Negative for fever, chills, weight loss, malaise/fatigue and diaphoresis.  HENT: Negative for hearing loss, ear pain, nosebleeds, congestion, sore throat, neck pain,  tinnitus and ear discharge.   Eyes: Negative for blurred vision, double vision, photophobia, pain, discharge and redness.  Respiratory: Negative for cough, hemoptysis, sputum production, shortness of breath, wheezing and  stridor.   Cardiovascular: Negative for chest pain, palpitations, orthopnea, claudication, leg swelling and PND.  Gastrointestinal: negative for abdominal pain. Negative for heartburn, nausea, vomiting, diarrhea, constipation, blood in stool and melena.  Genitourinary: Negative for dysuria, urgency, frequency, hematuria and flank pain.  Musculoskeletal: Negative for myalgias, back pain, joint pain and falls.  Skin: Negative for itching and rash.  Neurological: Negative for dizziness, tingling, tremors, sensory change, speech change, focal weakness, seizures, loss of consciousness, weakness and headaches.  Endo/Heme/Allergies: Negative for environmental allergies and polydipsia. Does not bruise/bleed easily.  Psychiatric/Behavioral: Negative for depression, suicidal ideas, hallucinations, memory loss and substance abuse. The patient is not nervous/anxious and does not have insomnia.        Objective:  Blood pressure 129/74, pulse 93, height 5' 5.5 (1.664 m), weight 294 lb (133.4 kg), last menstrual period 03/25/2024.   Physical Exam  Vitals reviewed. Constitutional: She is oriented to person, place, and time. She appears well-developed and well-nourished.  HENT:  Head: Normocephalic and atraumatic.        Right Ear: External ear normal.  Left Ear: External ear normal.  Nose: Nose normal.  Mouth/Throat: Oropharynx is clear and moist.  Eyes: Conjunctivae and EOM are normal. Pupils are equal, round, and reactive to light. Right eye exhibits no discharge. Left eye exhibits no discharge. No scleral icterus.  Neck: Normal range of motion. Neck supple. No tracheal deviation present. No thyromegaly present.  Cardiovascular: Normal rate, regular rhythm, normal heart sounds and  intact distal pulses.  Exam reveals no gallop and no friction rub.   No murmur heard. Respiratory: Effort normal and breath sounds normal. No respiratory distress. She has no wheezes. She has no rales. She exhibits no tenderness.  GI: Soft. Bowel sounds are normal. She exhibits no distension and no mass. There is no tenderness. There is no rebound and no guarding.  Genitourinary:  Breasts no masses skin changes or nipple changes bilaterally      Vulva is normal without lesions Vagina is pink moist without discharge Cervix normal in appearance and pap is done Uterus is normal size shape and contour Adnexa is negative with normal sized ovaries  Tender uterosacral ligaments bilaterally Musculoskeletal: Normal range of motion. She exhibits no edema and no tenderness.  Neurological: She is alert and oriented to person, place, and time. She has normal reflexes. She displays normal reflexes. No cranial nerve deficit. She exhibits normal muscle tone. Coordination normal.  Skin: Skin is warm and dry. No rash noted. No erythema. No pallor.  Psychiatric: She has a normal mood and affect. Her behavior is normal. Judgment and thought content normal.       Medications Ordered at today's visit: Meds ordered this encounter  Medications   megestrol  (MEGACE ) 40 MG tablet    Sig: 3 tablets a day for 5 days, 2 tablets a day for 5 days then 1 tablet daily    Dispense:  45 tablet    Refill:  3    Other orders placed at today's visit: No orders of the defined types were placed in this encounter.    ASSESSMENT + PLAN:    ICD-10-CM   1. Well woman exam with routine gynecological exam  Z01.419     2. Routine Papanicolaou smear  Z12.4 Cytology - PAP    3. Endometriosis determined by laparoscopy  N80.9     4. Deep endometriosis of bilateral uterosacral ligament(s)  N80.3B3     5. Dysmenorrhea  N94.6     6.  Dyspareunia, female  N94.10        Endometriosis known from laparoscopy and her symptoms +  exam are consistent as well Discussed at length management Short term megestrol  for pain Defintive surgical therapy RA TLH +/- BSO to be scheduled  Return in about 2 months (around 06/11/2024) for Post Op, with Dr Jayne.

## 2024-04-08 ENCOUNTER — Encounter: Payer: Self-pay | Admitting: Obstetrics & Gynecology

## 2024-04-08 ENCOUNTER — Ambulatory Visit (INDEPENDENT_AMBULATORY_CARE_PROVIDER_SITE_OTHER): Admitting: Family Medicine

## 2024-04-09 ENCOUNTER — Encounter: Payer: Self-pay | Admitting: Family Medicine

## 2024-04-09 ENCOUNTER — Ambulatory Visit: Admitting: Family Medicine

## 2024-04-09 VITALS — BP 108/78 | HR 90 | Temp 98.0°F | Ht 65.5 in | Wt 295.0 lb

## 2024-04-09 DIAGNOSIS — H6993 Unspecified Eustachian tube disorder, bilateral: Secondary | ICD-10-CM

## 2024-04-09 DIAGNOSIS — R051 Acute cough: Secondary | ICD-10-CM

## 2024-04-09 DIAGNOSIS — J029 Acute pharyngitis, unspecified: Secondary | ICD-10-CM

## 2024-04-09 LAB — POC COVID19 BINAXNOW: SARS Coronavirus 2 Ag: NEGATIVE

## 2024-04-09 MED ORDER — AZITHROMYCIN 250 MG PO TABS: ORAL_TABLET | ORAL | 0 refills | Status: AC

## 2024-04-09 MED ORDER — PREDNISONE 20 MG PO TABS
40.0000 mg | ORAL_TABLET | Freq: Every day | ORAL | 0 refills | Status: DC
Start: 2024-04-09 — End: 2024-04-16

## 2024-04-09 NOTE — Assessment & Plan Note (Signed)
 Oral prednisone  prescribed. Use Flonase. Start Z-pack if worsening or not improving in the next 2-3 days.

## 2024-04-09 NOTE — Patient Instructions (Signed)
 Take the steroids by mouth with food and plenty of water.  Take Zyrtec twice daily for the next week.  Use Flonase nasal spray  Use Tylenol  or ibuprofen  If you are getting worse in the next 2 to 3 days, or if no improvement by Monday, start the antibiotic.

## 2024-04-09 NOTE — Progress Notes (Signed)
 Subjective:   Discussed the use of AI scribe software for clinical note transcription with the patient, who gave verbal consent to proceed.  History of Present Illness Tara Singleton is a 38 year old female who presents with cough and possible allergic reaction.  Upper respiratory symptoms - Onset of symptoms 4 days ago - Sore throat and postnasal drainage present - Cough with occasional production of light green sputum - Cough and nasal secretions have a strong odor - Crackling sensation in chest - Difficulty taking deep breaths without triggering cough - No fever, chills, or body aches - Fatigue present - No history of smoking or underlying lung disease  Otolaryngologic symptoms - Left ear crackling sensation - History of eustachian tube dysfunction  Constitutional symptoms - Reduced appetite - Maintaining adequate hydration  Exposure history - Mother-in-law recently experienced symptoms consistent with a sinus infection     ROS as in subjective.   Objective: Vitals:   04/09/24 0830  BP: 108/78  Pulse: 90  Temp: 98 F (36.7 C)  SpO2: 97%    General appearance: Alert, WD/WN, no distress, ill appearing                             Skin: warm, no rash                           Head: no sinus tenderness                            Eyes: conjunctiva normal, corneas clear, PERRLA                            Ears: bilateral TMs with erythema, dull, external ear canals normal                          Nose: septum midline, turbinates swollen, with erythema and clear discharge             Mouth/throat: MMM, tongue normal, mild-mod pharyngeal erythema                           Neck: supple, no adenopathy but TTP bilaterally, no thyromegaly, nontender                          Heart: RRR                         Lungs: CTA bilaterally, no wheezes, rales, or rhonchi      Assessment and Plan Assessment & Plan Acute upper respiratory infection Acute upper  respiratory infection with onset four days ago. Symptoms include sore throat, postnasal drainage, cough with light green sputum, and fatigue. No fever, chills, or body aches reported. Differential diagnosis includes COVID-19 due to recent community cases. - Order COVID-19 test which was negative  - Recommend symptomatic management with acetaminophen  or ibuprofen, Mucinex, Flonase, Zyrtec bid for now, and oral prednisone  prescribed for eustachian tube dysfunction mod-sev.  - Advise good hydration - Recommend staying out of work for the next 24 to 48 hours  Allergic rhinitis Allergic rhinitis with recent exposure to potential allergen (mango). Symptoms include postnasal drainage and ear crackling, particularly in the left ear.  No significant sinus pain or pressure reported.

## 2024-04-11 LAB — CYTOLOGY - PAP
Chlamydia: NEGATIVE
Comment: NEGATIVE
Comment: NEGATIVE
Comment: NEGATIVE
Comment: NORMAL
Diagnosis: NEGATIVE
High risk HPV: NEGATIVE
Neisseria Gonorrhea: NEGATIVE
Trichomonas: NEGATIVE

## 2024-04-12 ENCOUNTER — Encounter: Payer: Self-pay | Admitting: Obstetrics & Gynecology

## 2024-04-13 DIAGNOSIS — N946 Dysmenorrhea, unspecified: Secondary | ICD-10-CM | POA: Insufficient documentation

## 2024-04-13 DIAGNOSIS — N803B3 Deep endometriosis of bilateral uterosacral ligament(s): Secondary | ICD-10-CM | POA: Insufficient documentation

## 2024-04-13 DIAGNOSIS — N941 Unspecified dyspareunia: Secondary | ICD-10-CM | POA: Insufficient documentation

## 2024-04-16 ENCOUNTER — Ambulatory Visit

## 2024-04-16 ENCOUNTER — Ambulatory Visit: Payer: Self-pay | Admitting: Family Medicine

## 2024-04-16 ENCOUNTER — Encounter: Payer: Self-pay | Admitting: Family Medicine

## 2024-04-16 ENCOUNTER — Ambulatory Visit: Admitting: Family Medicine

## 2024-04-16 VITALS — BP 112/74 | HR 76 | Temp 97.6°F | Ht 65.5 in | Wt 295.0 lb

## 2024-04-16 DIAGNOSIS — Z6841 Body Mass Index (BMI) 40.0 and over, adult: Secondary | ICD-10-CM

## 2024-04-16 DIAGNOSIS — R059 Cough, unspecified: Secondary | ICD-10-CM | POA: Diagnosis not present

## 2024-04-16 DIAGNOSIS — J4520 Mild intermittent asthma, uncomplicated: Secondary | ICD-10-CM

## 2024-04-16 DIAGNOSIS — I10 Essential (primary) hypertension: Secondary | ICD-10-CM

## 2024-04-16 DIAGNOSIS — R051 Acute cough: Secondary | ICD-10-CM

## 2024-04-16 DIAGNOSIS — F330 Major depressive disorder, recurrent, mild: Secondary | ICD-10-CM | POA: Diagnosis not present

## 2024-04-16 DIAGNOSIS — R5383 Other fatigue: Secondary | ICD-10-CM

## 2024-04-16 DIAGNOSIS — E559 Vitamin D deficiency, unspecified: Secondary | ICD-10-CM | POA: Diagnosis not present

## 2024-04-16 DIAGNOSIS — Z8249 Family history of ischemic heart disease and other diseases of the circulatory system: Secondary | ICD-10-CM

## 2024-04-16 DIAGNOSIS — R0989 Other specified symptoms and signs involving the circulatory and respiratory systems: Secondary | ICD-10-CM | POA: Diagnosis not present

## 2024-04-16 DIAGNOSIS — R062 Wheezing: Secondary | ICD-10-CM | POA: Diagnosis not present

## 2024-04-16 DIAGNOSIS — E7800 Pure hypercholesterolemia, unspecified: Secondary | ICD-10-CM

## 2024-04-16 LAB — CBC WITH DIFFERENTIAL/PLATELET
Basophils Absolute: 0.1 K/uL (ref 0.0–0.1)
Basophils Relative: 0.7 % (ref 0.0–3.0)
Eosinophils Absolute: 0.1 K/uL (ref 0.0–0.7)
Eosinophils Relative: 1 % (ref 0.0–5.0)
HCT: 43.9 % (ref 36.0–46.0)
Hemoglobin: 14.3 g/dL (ref 12.0–15.0)
Lymphocytes Relative: 39.2 % (ref 12.0–46.0)
Lymphs Abs: 3.3 K/uL (ref 0.7–4.0)
MCHC: 32.5 g/dL (ref 30.0–36.0)
MCV: 92.3 fl (ref 78.0–100.0)
Monocytes Absolute: 0.4 K/uL (ref 0.1–1.0)
Monocytes Relative: 4.9 % (ref 3.0–12.0)
Neutro Abs: 4.5 K/uL (ref 1.4–7.7)
Neutrophils Relative %: 54.2 % (ref 43.0–77.0)
Platelets: 339 K/uL (ref 150.0–400.0)
RBC: 4.76 Mil/uL (ref 3.87–5.11)
RDW: 14.1 % (ref 11.5–15.5)
WBC: 8.3 K/uL (ref 4.0–10.5)

## 2024-04-16 LAB — LIPID PANEL
Cholesterol: 140 mg/dL (ref 0–200)
HDL: 46.6 mg/dL (ref >39.00)
LDL Cholesterol: 68 mg/dL (ref 0–99)
NonHDL: 93.71
Total CHOL/HDL Ratio: 3
Triglycerides: 127 mg/dL (ref 0.0–149.0)
VLDL: 25.4 mg/dL (ref 0.0–40.0)

## 2024-04-16 LAB — COMPREHENSIVE METABOLIC PANEL WITH GFR
ALT: 33 U/L (ref 0–35)
AST: 15 U/L (ref 0–37)
Albumin: 4.1 g/dL (ref 3.5–5.2)
Alkaline Phosphatase: 61 U/L (ref 39–117)
BUN: 11 mg/dL (ref 6–23)
CO2: 24 meq/L (ref 19–32)
Calcium: 8.6 mg/dL (ref 8.4–10.5)
Chloride: 106 meq/L (ref 96–112)
Creatinine, Ser: 0.77 mg/dL (ref 0.40–1.20)
GFR: 98.65 mL/min (ref >60.00)
Glucose, Bld: 84 mg/dL (ref 70–99)
Potassium: 3.9 meq/L (ref 3.5–5.1)
Sodium: 140 meq/L (ref 135–145)
Total Bilirubin: 0.3 mg/dL (ref 0.2–1.2)
Total Protein: 6.7 g/dL (ref 6.0–8.3)

## 2024-04-16 LAB — VITAMIN D 25 HYDROXY (VIT D DEFICIENCY, FRACTURES): VITD: 40.94 ng/mL (ref 30.00–100.00)

## 2024-04-16 MED ORDER — AMOXICILLIN-POT CLAVULANATE 875-125 MG PO TABS: 1.0000 | ORAL_TABLET | Freq: Two times a day (BID) | ORAL | 0 refills | Status: DC

## 2024-04-16 MED ORDER — PREDNISONE 20 MG PO TABS: 20.0000 mg | ORAL_TABLET | Freq: Every day | ORAL | 0 refills | Status: DC

## 2024-04-16 MED ORDER — PROMETHAZINE-DM 6.25-15 MG/5ML PO SYRP: 5.0000 mL | ORAL_SOLUTION | Freq: Four times a day (QID) | ORAL | 0 refills | Status: DC | PRN

## 2024-04-16 NOTE — Progress Notes (Signed)
 Subjective:     Patient ID: Tara Singleton, female    DOB: 09-Dec-1985, 38 y.o.   MRN: [id number]  Chief Complaint  Patient presents with   Follow-up    F/u regarding migraines and to check cholesterol   Also wants to check over lungs since last visit, feeling better but still having some drainage     HPI  Discussed the use of AI scribe software for clinical note transcription with the patient, who gave verbal consent to proceed.  History of Present Illness Tara Singleton is a 38 year old female with asthma who presents with persistent cough and respiratory symptoms. She is also here to recheck lipid panel since starting on statin therapy.   Cough and respiratory symptoms - Persistent cough with chest congestion - Green, 'super sticky' sputum production affecting sinuses and chest - Crackling sounds in chest occur less frequently - Headaches primarily triggered by coughing - Symptoms improved by approximately 40% after completing azithromycin  and steroids - Continues to feel exhausted and sleeps extensively - Takes Mucinex twice daily - Uses ProAir inhaler for asthma - No allergies to penicillin or amoxicillin  - Recently stopped vaping  Gynecologic symptoms and pelvic pain - Scheduled for hysterectomy in seven weeks due to pelvic pain with suspected endometriosis - Takes medication to stop bleeding, but bleeding has resumed       Health Maintenance Due  Topic Date Due   HIV Screening  Never done   Hepatitis C Screening  Never done   Hepatitis B Vaccines (1 of 3 - 19+ 3-dose series) Never done   HPV VACCINES (1 - 3-dose SCDM series) Never done   Pneumococcal Vaccine: 19-49 Years (2 of 2 - PCV) 10/10/2017   COVID-19 Vaccine (4 - 2024-25 season) 05/11/2023    Past Medical History:  Diagnosis Date   Allergy    Age 5   Anxiety    Arthritis    Age 42 via MRI knee   Asthma    B12 deficiency    Constipation    Depression    Endometriosis    GERD  (gastroesophageal reflux disease)    Headache    Infertility, female    PCOS (polycystic ovarian syndrome)    Sleep apnea    Swelling of lower extremity     Past Surgical History:  Procedure Laterality Date   EXTRACORPOREAL SHOCK WAVE LITHOTRIPSY Left 01/28/2022   Procedure: EXTRACORPOREAL SHOCK WAVE LITHOTRIPSY (ESWL);  Surgeon: Renda Glance, MD;  Location: [facility];  Service: Urology;  Laterality: Left;    Family History  Problem Relation Age of Onset   Obesity Paternal Grandfather    COPD Paternal Grandmother    Obesity Paternal Grandmother    Cancer Maternal Grandmother    Obesity Maternal Grandmother    Asthma Father    Anxiety disorder Father    Sleep apnea Father    Alcoholism Father    Drug abuse Father    Obesity Father    Alcohol abuse Father    High blood pressure Mother    High Cholesterol Mother    Stroke Mother    Thyroid disease Mother    Cancer Mother    Depression Mother    Anxiety disorder Mother    Obesity Mother    Hypertension Mother    Uterine cancer Mother     Social History   Socioeconomic History   Marital status: Married    Spouse name: Not on file   Number of children: Not  on file   Years of education: Not on file   Highest education level: Some college, no degree  Occupational History   Not on file  Tobacco Use   Smoking status: Former    Types: Cigarettes   Smokeless tobacco: Never  Vaping Use   Vaping status: Every Day  Substance and Sexual Activity   Alcohol use: Never    Comment: occ   Drug use: Never   Sexual activity: Not Currently    Birth control/protection: I.U.D.  Other Topics Concern   Not on file  Social History Narrative   Not on file   Social Drivers of Health   Financial Resource Strain: Medium Risk (04/06/2024)   Overall Financial Resource Strain (CARDIA)    Difficulty of Paying Living Expenses: Somewhat hard  Food Insecurity: No Food Insecurity (04/06/2024)   Hunger Vital Sign     Worried About Running Out of Food in the Last Year: Never true    Ran Out of Food in the Last Year: Never true  Transportation Needs: No Transportation Needs (04/06/2024)   PRAPARE - Administrator, Civil Service (Medical): No    Lack of Transportation (Non-Medical): No  Physical Activity: Insufficiently Active (04/06/2024)   Exercise Vital Sign    Days of Exercise per Week: 5 days    Minutes of Exercise per Session: 20 min  Stress: Stress Concern Present (04/06/2024)   [employer] of Occupational Health - Occupational Stress Questionnaire    Feeling of Stress: To some extent  Social Connections: Moderately Isolated (04/06/2024)   Social Connection and Isolation Panel    Frequency of Communication with Friends and Family: More than three times a week    Frequency of Social Gatherings with Friends and Family: Once a week    Attends Religious Services: Never    Database administrator or Organizations: No    Attends Banker Meetings: Never    Marital Status: Married  Catering manager Violence: Not At Risk (04/06/2024)   Humiliation, Afraid, Rape, and Kick questionnaire    Fear of Current or Ex-Partner: No    Emotionally Abused: No    Physically Abused: No    Sexually Abused: No    Outpatient Medications Prior to Visit  Medication Sig Dispense Refill   ABILIFY 5 MG tablet      cetirizine (ZYRTEC) 10 MG tablet Take by mouth.     EPINEPHrine  0.3 mg/0.3 mL IJ SOAJ injection Inject 0.3 mg into the muscle as needed for anaphylaxis. 1 each 0   FLUoxetine (PROZAC) 40 MG capsule Take 40 mg by mouth daily.     lamoTRIgine (LAMICTAL) 100 MG tablet Take 100 mg by mouth daily.     megestrol  (MEGACE ) 40 MG tablet 3 tablets a day for 5 days, 2 tablets a day for 5 days then 1 tablet daily 45 tablet 3   mometasone (NASONEX) 50 MCG/ACT nasal spray Place 2 sprays into the nose daily.     ondansetron  (ZOFRAN -ODT) 4 MG disintegrating tablet Take 1 tablet (4 mg total) by mouth  every 12 (twelve) hours. 20 tablet 1   PROAIR HFA 108 (90 Base) MCG/ACT inhaler Inhale into the lungs.     rizatriptan  (MAXALT ) 10 MG tablet Take 1 tablet (10 mg total) by mouth as needed for migraine. 10 tablet 0   rosuvastatin  (CRESTOR ) 10 MG tablet Take 1 tablet (10 mg total) by mouth daily. 90 tablet 1   Semaglutide -Weight Management (WEGOVY ) 1 MG/0.5ML SOAJ Inject 1  mg into the skin once a week. 2 mL 0   verapamil  (CALAN -SR) 120 MG CR tablet Take 1 tablet (120 mg total) by mouth at bedtime. 30 tablet 4   Vitamin D , Ergocalciferol , (DRISDOL ) 1.25 MG (50000 UNIT) CAPS capsule Take 1 capsule (50,000 Units total) by mouth every 7 (seven) days. 12 capsule 0   predniSONE  (DELTASONE ) 20 MG tablet Take 2 tablets (40 mg total) by mouth daily with breakfast. 10 tablet 0   No facility-administered medications prior to visit.    Allergies  Allergen Reactions   Avocado Hives, Nausea And Vomiting, Rash, Shortness Of Breath and Swelling   Betula Alba Oil Anaphylaxis   Kiwi Extract Dermatitis, Hives, Rash and Shortness Of Breath   Shellfish Allergy Anaphylaxis   Sulfa Antibiotics Hives, Other (See Comments) and Rash    Upset stomach    White Birch Anaphylaxis    [place] trees   Latex Hives   Sulfur Other (See Comments)    Welts   Avocado Extract Allergy Skin Test Hives   Macrobid [Nitrofurantoin] Hives   Mango Flavoring Agent (Non-Screening) Hives    Review of Systems  Constitutional:  Positive for malaise/fatigue. Negative for chills and fever.  HENT:  Positive for congestion and sinus pain.   Respiratory:  Positive for cough, sputum production and shortness of breath.   Cardiovascular:  Negative for chest pain, palpitations and leg swelling.  Gastrointestinal:  Negative for abdominal pain, constipation, diarrhea, nausea and vomiting.  Genitourinary:  Negative for dysuria, frequency and urgency.  Neurological:  Positive for headaches. Negative for dizziness and focal weakness.        Objective:    Physical Exam Constitutional:      General: She is not in acute distress.    Appearance: She is obese. She is not ill-appearing.  HENT:     Nose: Congestion present.  Eyes:     Extraocular Movements: Extraocular movements intact.     Conjunctiva/sclera: Conjunctivae normal.  Cardiovascular:     Rate and Rhythm: Normal rate and regular rhythm.  Pulmonary:     Effort: Pulmonary effort is normal.     Breath sounds: Wheezing present.     Comments: Mild exp wheezes throughout Musculoskeletal:        General: Normal range of motion.     Cervical back: Normal range of motion and neck supple.  Skin:    General: Skin is warm and dry.  Neurological:     General: No focal deficit present.     Mental Status: She is alert and oriented to person, place, and time.     Motor: No weakness.     Coordination: Coordination normal.     Gait: Gait normal.  Psychiatric:        Mood and Affect: Mood normal.        Behavior: Behavior normal.        Thought Content: Thought content normal.      BP 112/74   Pulse 76   Temp 97.6 F (36.4 C) (Temporal)   Ht 5' 5.5 (1.664 m)   Wt 295 lb (133.8 kg)   LMP 03/25/2024 (Exact Date)   SpO2 98%   BMI 48.34 kg/m  Wt Readings from Last 3 Encounters:  04/16/24 295 lb (133.8 kg)  04/09/24 295 lb (133.8 kg)  04/06/24 294 lb (133.4 kg)       Assessment & Plan:   Problem List Items Addressed This Visit     Essential hypertension   Relevant Orders  CBC with Differential/Platelet   Comprehensive metabolic panel with GFR   Family history of early CAD   Relevant Orders   Lipid panel   Hyperlipidemia - Primary   Relevant Orders   Lipid panel   Vitamin D  deficiency   Relevant Orders   VITAMIN D  25 Hydroxy (Vit-D Deficiency, Fractures)   Other Visit Diagnoses       Severe obesity (BMI >= 40) (HCC)       Relevant Orders   Lipid panel   CBC with Differential/Platelet   Comprehensive metabolic panel with GFR     Acute cough        Relevant Orders   DG Chest 2 View     Fatigue, unspecified type       Relevant Orders   DG Chest 2 View     Mild intermittent asthma without complication       Relevant Medications   predniSONE  (DELTASONE ) 20 MG tablet      Assessment and Plan Assessment & Plan Acute lower respiratory tract infection in a patient with asthma Persistent cough with green sputum production and chest congestion. Symptoms have not fully resolved. No chest x-ray was performed previously, but one is now warranted to rule out pneumonia. Currently using ProAir inhaler. Recent cessation of vaping, which should aid in recovery. - Order chest x-ray to evaluate for pneumonia - Prescribe 5-day course of steroids - Prescribe promethazine  DM for cough relief and to aid sleep - Prescribe Augmentin , previously completed course of azithromycin   Asthma Managed with ProAir inhaler. - Continue use of ProAir inhaler as needed  Hyperlipidemia Re-evaluation of cholesterol levels is necessary following the initiation of cholesterol medication. - Recheck cholesterol levels  Vitamin D  deficiency Previous vitamin D  levels were low. Re-evaluation is necessary to assess current status. - Recheck vitamin D  levels    I have discontinued Jaliyah Svehla Vinny's predniSONE . I am also having her start on promethazine -dextromethorphan and predniSONE . Additionally, I am having her maintain her ProAir HFA, cetirizine, FLUoxetine, mometasone, Abilify, EPINEPHrine , rosuvastatin , lamoTRIgine, Wegovy , Vitamin D  (Ergocalciferol ), verapamil , ondansetron , rizatriptan , and megestrol .  Meds ordered this encounter  Medications   promethazine -dextromethorphan (PROMETHAZINE -DM) 6.25-15 MG/5ML syrup    Sig: Take 5 mLs by mouth 4 (four) times daily as needed for cough.    Dispense:  118 mL    Refill:  0    Supervising Provider:   ROLLENE NORRIS A [[id number]]   predniSONE  (DELTASONE ) 20 MG tablet    Sig: Take 1 tablet (20 mg total)  by mouth daily with breakfast.    Dispense:  10 tablet    Refill:  0    Supervising Provider:   ROLLENE NORRIS A [[id number]]

## 2024-04-18 ENCOUNTER — Encounter (HOSPITAL_COMMUNITY): Payer: Self-pay | Admitting: Emergency Medicine

## 2024-04-18 ENCOUNTER — Emergency Department (HOSPITAL_COMMUNITY)
Admission: EM | Admit: 2024-04-18 | Discharge: 2024-04-18 | Disposition: A | Discharge status: Home / Self Care | Attending: Emergency Medicine | Admitting: Emergency Medicine

## 2024-04-18 ENCOUNTER — Emergency Department (HOSPITAL_COMMUNITY)

## 2024-04-18 ENCOUNTER — Encounter: Payer: Self-pay | Admitting: Obstetrics & Gynecology

## 2024-04-18 ENCOUNTER — Other Ambulatory Visit: Payer: Self-pay

## 2024-04-18 DIAGNOSIS — R102 Pelvic and perineal pain: Secondary | ICD-10-CM | POA: Diagnosis not present

## 2024-04-18 DIAGNOSIS — N939 Abnormal uterine and vaginal bleeding, unspecified: Secondary | ICD-10-CM | POA: Diagnosis not present

## 2024-04-18 DIAGNOSIS — Z91040 Latex allergy status: Secondary | ICD-10-CM | POA: Insufficient documentation

## 2024-04-18 LAB — COMPREHENSIVE METABOLIC PANEL WITH GFR
ALT: 48 U/L — ABNORMAL HIGH (ref 0–44)
AST: 19 U/L (ref 15–41)
Albumin: 4.1 g/dL (ref 3.5–5.0)
Alkaline Phosphatase: 65 U/L (ref 38–126)
Anion gap: 11 (ref 5–15)
BUN: 12 mg/dL (ref 6–20)
CO2: 23 mmol/L (ref 22–32)
Calcium: 9.5 mg/dL (ref 8.9–10.3)
Chloride: 103 mmol/L (ref 98–111)
Creatinine, Ser: 0.83 mg/dL (ref 0.44–1.00)
GFR, Estimated: >60 mL/min (ref >60)
Glucose, Bld: 95 mg/dL (ref 70–99)
Potassium: 3.8 mmol/L (ref 3.5–5.1)
Sodium: 137 mmol/L (ref 135–145)
Total Bilirubin: 0.9 mg/dL (ref 0.0–1.2)
Total Protein: 7.2 g/dL (ref 6.5–8.1)

## 2024-04-18 LAB — CBC WITH DIFFERENTIAL/PLATELET
Abs Immature Granulocytes: 0.05 K/uL (ref 0.00–0.07)
Basophils Absolute: 0.1 K/uL (ref 0.0–0.1)
Basophils Relative: 1 %
Eosinophils Absolute: 0.1 K/uL (ref 0.0–0.5)
Eosinophils Relative: 1 %
HCT: 38.9 % (ref 36.0–46.0)
Hemoglobin: 13 g/dL (ref 12.0–15.0)
Immature Granulocytes: 1 %
Lymphocytes Relative: 29 %
Lymphs Abs: 2.9 K/uL (ref 0.7–4.0)
MCH: 30.9 pg (ref 26.0–34.0)
MCHC: 33.4 g/dL (ref 30.0–36.0)
MCV: 92.4 fL (ref 80.0–100.0)
Monocytes Absolute: 0.6 K/uL (ref 0.1–1.0)
Monocytes Relative: 6 %
Neutro Abs: 6.2 K/uL (ref 1.7–7.7)
Neutrophils Relative %: 62 %
Platelets: 354 K/uL (ref 150–400)
RBC: 4.21 MIL/uL (ref 3.87–5.11)
RDW: 13.2 % (ref 11.5–15.5)
WBC: 9.9 K/uL (ref 4.0–10.5)
nRBC: 0 % (ref 0.0–0.2)

## 2024-04-18 LAB — URINALYSIS, ROUTINE W REFLEX MICROSCOPIC
Bacteria, UA: NONE SEEN
Bilirubin Urine: NEGATIVE
Glucose, UA: NEGATIVE mg/dL
Ketones, ur: NEGATIVE mg/dL
Leukocytes,Ua: NEGATIVE
Nitrite: NEGATIVE
Protein, ur: NEGATIVE mg/dL
Specific Gravity, Urine: 1.008 (ref 1.005–1.030)
pH: 6 (ref 5.0–8.0)

## 2024-04-18 LAB — HCG, QUANTITATIVE, PREGNANCY: hCG, Beta Chain, Quant, S: <1 m[IU]/mL (ref <5)

## 2024-04-18 LAB — D-DIMER, QUANTITATIVE: D-Dimer, Quant: 0.28 ug{FEU}/mL (ref 0.00–0.50)

## 2024-04-18 MED ORDER — KETOROLAC TROMETHAMINE 30 MG/ML IJ SOLN
30.0000 mg | Freq: Once | INTRAMUSCULAR | Status: AC
  Administered 2024-04-18: 30 mg via INTRAVENOUS
  Filled 2024-04-18: qty 1

## 2024-04-18 MED ORDER — SODIUM CHLORIDE 0.9 % IV BOLUS
500.0000 mL | Freq: Once | INTRAVENOUS | Status: AC
  Administered 2024-04-18: 500 mL via INTRAVENOUS

## 2024-04-18 NOTE — ED Provider Notes (Signed)
 [place] EMERGENCY DEPARTMENT AT [facility] Provider Note   CSN: [id number] Arrival date & time: 04/18/24  1121     Patient presents with: Abdominal Pain   Tara Singleton is a 38 y.o. female.  {Add pertinent medical, surgical, social history, OB history to YEP:[id number]} Patient is on Megace  for excessive uterine bleeding.  Patient complains of left lower quadrant discomfort.  She also has a history of cyst.  She is having mild bleeding vaginally now  The history is provided by the patient and medical records. No language interpreter was used.  Abdominal Pain Pain location:  LLQ Pain quality: aching   Pain radiates to:  Does not radiate Pain severity:  Moderate Onset quality:  Sudden Timing:  Constant Progression:  Waxing and waning Chronicity:  New Context: not alcohol use   Associated symptoms: no chest pain, no cough, no diarrhea, no fatigue and no hematuria        Prior to Admission medications   Medication Sig Start Date End Date Taking? Authorizing Provider  ABILIFY 5 MG tablet  03/13/23   [provider]  amoxicillin -clavulanate (AUGMENTIN ) 875-125 MG tablet Take 1 tablet by mouth 2 (two) times daily. 04/16/24   Henson, Vickie L, NP-C  cetirizine (ZYRTEC) 10 MG tablet Take by mouth.    [provider]  EPINEPHrine  0.3 mg/0.3 mL IJ SOAJ injection Inject 0.3 mg into the muscle as needed for anaphylaxis. 12/24/23   Henson, Vickie L, NP-C  FLUoxetine (PROZAC) 40 MG capsule Take 40 mg by mouth daily. 02/06/22   [provider]  lamoTRIgine (LAMICTAL) 100 MG tablet Take 100 mg by mouth daily. 03/08/24   [provider]  levonorgestrel (MIRENA) 20 MCG/DAY IUD 1 each by Intrauterine route once.    [provider]  megestrol  (MEGACE ) 40 MG tablet 3 tablets a day for 5 days, 2 tablets a day for 5 days then 1 tablet daily 04/06/24   Jayne Vonn DEL, MD  mometasone (NASONEX) 50 MCG/ACT nasal spray Place 2 sprays into the nose daily.     [provider]  ondansetron  (ZOFRAN -ODT) 4 MG disintegrating tablet Take 1 tablet (4 mg total) by mouth every 12 (twelve) hours. 03/26/24   Henson, Vickie L, NP-C  predniSONE  (DELTASONE ) 20 MG tablet Take 1 tablet (20 mg total) by mouth daily with breakfast. 04/16/24   Henson, Vickie L, NP-C  PROAIR HFA 108 (90 Base) MCG/ACT inhaler Inhale into the lungs. 07/26/21   [provider]  promethazine -dextromethorphan (PROMETHAZINE -DM) 6.25-15 MG/5ML syrup Take 5 mLs by mouth 4 (four) times daily as needed for cough. 04/16/24   Henson, Vickie L, NP-C  rizatriptan  (MAXALT ) 10 MG tablet Take 1 tablet (10 mg total) by mouth as needed for migraine. 03/26/24   Henson, Vickie L, NP-C  rosuvastatin  (CRESTOR ) 10 MG tablet Take 1 tablet (10 mg total) by mouth daily. 02/27/24   Henson, Vickie L, NP-C  Semaglutide -Weight Management (WEGOVY ) 1 MG/0.5ML SOAJ Inject 1 mg into the skin once a week. 03/25/24   [place] Adelita PENNER, MD  verapamil  (CALAN -SR) 120 MG CR tablet Take 1 tablet (120 mg total) by mouth at bedtime. 03/26/24   Henson, Vickie L, NP-C  Vitamin D , Ergocalciferol , (DRISDOL ) 1.25 MG (50000 UNIT) CAPS capsule Take 1 capsule (50,000 Units total) by mouth every 7 (seven) days. 03/25/24   [place] Adelita PENNER, MD    Allergies: Avocado, Betula alba oil, Kiwi extract, Shellfish allergy, Sulfa antibiotics, White birch, Latex, Sulfur, Avocado extract allergy skin test, Macrobid [  nitrofurantoin], and Mango flavoring agent (non-screening)    Review of Systems  Constitutional:  Negative for appetite change and fatigue.  HENT:  Negative for congestion, ear discharge and sinus pressure.   Eyes:  Negative for discharge.  Respiratory:  Negative for cough.   Cardiovascular:  Negative for chest pain.  Gastrointestinal:  Positive for abdominal pain. Negative for diarrhea.  Genitourinary:  Negative for frequency and hematuria.  Musculoskeletal:  Negative for back pain.  Skin:  Negative for rash.   Neurological:  Negative for seizures and headaches.  Psychiatric/Behavioral:  Negative for hallucinations.     Updated Vital Signs BP 130/69   Pulse 92   Temp 97.6 F (36.4 C) (Oral)   Resp 16   Ht 5' 5 (1.651 m)   Wt 133.8 kg   LMP 03/25/2024 (Exact Date)   SpO2 99%   BMI 49.09 kg/m   Physical Exam Vitals and nursing note reviewed.  Constitutional:      Appearance: She is well-developed.  HENT:     Head: Normocephalic.     Nose: Nose normal.  Eyes:     General: No scleral icterus.    Conjunctiva/sclera: Conjunctivae normal.  Neck:     Thyroid: No thyromegaly.  Cardiovascular:     Rate and Rhythm: Normal rate and regular rhythm.     Heart sounds: No murmur heard.    No friction rub. No gallop.  Pulmonary:     Breath sounds: No stridor. No wheezing or rales.  Chest:     Chest wall: No tenderness.  Abdominal:     General: There is no distension.     Tenderness: There is abdominal tenderness. There is no rebound.     Comments: Moderate left lower quadrant tenderness  Musculoskeletal:        General: Normal range of motion.     Cervical back: Neck supple.  Lymphadenopathy:     Cervical: No cervical adenopathy.  Skin:    Findings: No erythema or rash.  Neurological:     Mental Status: She is alert and oriented to person, place, and time.     Motor: No abnormal muscle tone.     Coordination: Coordination normal.  Psychiatric:        Behavior: Behavior normal.     (all labs ordered are listed, but only abnormal results are displayed) Labs Reviewed  COMPREHENSIVE METABOLIC PANEL WITH GFR - Abnormal; Notable for the following components:      Result Value   ALT 48 (*)    All other components within normal limits  URINALYSIS, ROUTINE W REFLEX MICROSCOPIC - Abnormal; Notable for the following components:   Color, Urine STRAW (*)    Hgb urine dipstick SMALL (*)    All other components within normal limits  CBC WITH DIFFERENTIAL/PLATELET  D-DIMER,  QUANTITATIVE  HCG, QUANTITATIVE, PREGNANCY    EKG: None  Radiology: No results found.  {Document cardiac monitor, telemetry assessment procedure when appropriate:32947} Procedures   Medications Ordered in the ED  sodium chloride  0.9 % bolus 500 mL (500 mLs Intravenous New Bag/Given 04/18/24 1209)  ketorolac  (TORADOL ) 30 MG/ML injection 30 mg (30 mg Intravenous Given 04/18/24 1213)      {Click here for ABCD2, HEART and other calculators REFRESH Note before signing:1}                              Medical Decision Making Amount and/or Complexity of Data Reviewed Labs: ordered.  Radiology: ordered.  Risk Prescription drug management.   Patient with abdominal pain most likely related to ovarian cyst.  Ultrasound will be done to rule out torsion  {Document critical care time when appropriate  Document review of labs and clinical decision tools ie CHADS2VASC2, etc  Document your independent review of radiology images and any outside records  Document your discussion with family members, caretakers and with consultants  Document social determinants of health affecting pt's care  Document your decision making why or why not admission, treatments were needed:32947:::1}   Final diagnoses:  None    ED Discharge Orders     None

## 2024-04-18 NOTE — ED Provider Notes (Signed)
 Signout from Dr. Zammit.  38 year old female with lower abdominal pain.  Has ongoing vaginal bleeding and is on Megace .  Workup with normal white count normal hemoglobin negative urinalysis.  She is pending pelvic ultrasound.  Likely discharge if no acute findings. Physical Exam  BP 130/69   Pulse 92   Temp 97.6 F (36.4 C) (Oral)   Resp 16   Ht 5' 5 (1.651 m)   Wt 133.8 kg   LMP 03/25/2024 (Exact Date)   SpO2 99%   BMI 49.09 kg/m   Physical Exam  Procedures  Procedures  ED Course / MDM    Medical Decision Making Amount and/or Complexity of Data Reviewed Labs: ordered. Radiology: ordered.  Risk Prescription drug management.   Ultrasound not showing any acute findings.  Reviewed with patient.  She is comfortable plan for discharge and follow-up with her gynecology team.  Return instructions discussed       Tara Ozell BROCKS, MD 04/19/24 1019

## 2024-04-18 NOTE — ED Triage Notes (Signed)
 Lower abdominal pain with cramping and bright red bleeding. Pt states this began yesterday.  The patient is on hormone replacement to stop bleeding and is scheduled for a hysterectomy September 2025.

## 2024-04-18 NOTE — ED Notes (Signed)
 Patient transported to Ultrasound

## 2024-04-18 NOTE — Discharge Instructions (Signed)
 You were seen in the emergency department for lower abdominal pain.  You had lab work urinalysis and a pelvic ultrasound that did not show any significant abnormalities.  Please follow-up with your gynecology team.  Return to the emergency department if any worsening or concerning symptoms

## 2024-04-21 ENCOUNTER — Ambulatory Visit: Admitting: Adult Health

## 2024-04-21 ENCOUNTER — Encounter: Payer: Self-pay | Admitting: Adult Health

## 2024-04-21 VITALS — BP 131/83 | HR 78 | Ht 65.0 in | Wt 296.0 lb

## 2024-04-21 DIAGNOSIS — Z975 Presence of (intrauterine) contraceptive device: Secondary | ICD-10-CM | POA: Diagnosis not present

## 2024-04-21 DIAGNOSIS — N809 Endometriosis, unspecified: Secondary | ICD-10-CM

## 2024-04-21 DIAGNOSIS — R1032 Left lower quadrant pain: Secondary | ICD-10-CM | POA: Diagnosis not present

## 2024-04-21 DIAGNOSIS — N939 Abnormal uterine and vaginal bleeding, unspecified: Secondary | ICD-10-CM | POA: Diagnosis not present

## 2024-04-21 MED ORDER — MEGESTROL ACETATE 40 MG PO TABS: ORAL_TABLET | ORAL | 1 refills | Status: DC

## 2024-04-21 MED ORDER — KETOROLAC TROMETHAMINE 10 MG PO TABS: 10.0000 mg | ORAL_TABLET | Freq: Four times a day (QID) | ORAL | 0 refills | Status: DC | PRN

## 2024-04-21 NOTE — Progress Notes (Signed)
  Subjective:     Patient ID: Tara Singleton, female   DOB: Sep 13, 1985, 38 y.o.   MRN: [id number]  HPI Tara Singleton is a 38 year old white female,married, G5P0050, in complaining of vaginal bright red vaginal bleeding and pain esp LLQ. She was seen in ER 04/18/24 at [facility], HGB was 13, and US  was normal, IUD in place. She has hysterectomy scheduled 06/02/24 with Dr Jayne, she has known endometriosis. She is taking 40 mg of megace  daily and still bleeding, she did not bleed on on 2 or 3.      Component Value Date/Time   DIAGPAP  04/06/2024 1441    - Negative for intraepithelial lesion or malignancy (NILM)   HPVHIGH Negative 04/06/2024 1441   ADEQPAP  04/06/2024 1441    Satisfactory for evaluation; transformation zone component PRESENT.    PCP is Boby Mackintosh NP   Review of Systems +vaginal bleeding +LLQ pain Reviewed past medical,surgical, social and family history. Reviewed medications and allergies.     Objective:   Physical Exam BP 131/83 (BP Location: Right Arm, Patient Position: Sitting, Cuff Size: Large)   Pulse 78   Ht 5' 5 (1.651 m)   Wt 296 lb (134.3 kg)   LMP 04/17/2024 (Exact Date)   BMI 49.26 kg/m     Skin warm and dry.  Lungs: clear to ausculation bilaterally. Cardiovascular: regular rate and rhythm.   Upstream - 04/21/24 1527       Pregnancy Intention Screening   Does the patient want to become pregnant in the next year? No    Does the patient's partner want to become pregnant in the next year? No    Would the patient like to discuss contraceptive options today? No      Contraception Wrap Up   Current Method Abstinence;IUD or IUS    End Method Abstinence;IUD or IUS    Contraception Counseling Provided No          Assessment:     1. Vaginal bleeding (Primary) +bright red bleeding Will increase megace  40 mg 2 daily at same time to see if helps  Meds ordered this encounter  Medications   megestrol  (MEGACE ) 40 MG tablet    Sig: Take 2 tablets daily at same time     Dispense:  60 tablet    Refill:  1    Supervising Provider:   JAYNE MINDER H [[id number]]   ketorolac  (TORADOL ) 10 MG tablet    Sig: Take 1 tablet (10 mg total) by mouth every 6 (six) hours as needed.    Dispense:  20 tablet    Refill:  0    Supervising Provider:   JAYNE MINDER H [[id number]]     2. LLQ pain Has LLQ pain Will rx toradol  10 mg 1 every 6 hours prn pain She is aware of GI bleeding risk with prozac and what to watch for   3. Endometriosis determined by laparoscopy Has hysterectomy scheduled 06/02/24  4. IUD (intrauterine device) in place IUD in place as seen on US  04/18/24    Plan:    Message me Friday on how she is doing Follow up TBD

## 2024-04-22 ENCOUNTER — Ambulatory Visit: Admitting: Adult Health

## 2024-04-24 ENCOUNTER — Encounter: Payer: Self-pay | Admitting: Obstetrics & Gynecology

## 2024-04-25 ENCOUNTER — Encounter (INDEPENDENT_AMBULATORY_CARE_PROVIDER_SITE_OTHER): Payer: Self-pay | Admitting: Family Medicine

## 2024-04-26 ENCOUNTER — Other Ambulatory Visit: Payer: Self-pay | Admitting: Obstetrics & Gynecology

## 2024-04-26 DIAGNOSIS — Z01818 Encounter for other preprocedural examination: Secondary | ICD-10-CM

## 2024-04-27 ENCOUNTER — Encounter (HOSPITAL_COMMUNITY): Payer: Self-pay

## 2024-04-27 ENCOUNTER — Encounter (HOSPITAL_COMMUNITY)
Admission: RE | Admit: 2024-04-27 | Discharge: 2024-04-27 | Disposition: A | Discharge status: Home / Self Care | Source: Ambulatory Visit | Attending: Obstetrics & Gynecology | Admitting: Obstetrics & Gynecology

## 2024-04-27 DIAGNOSIS — N920 Excessive and frequent menstruation with regular cycle: Secondary | ICD-10-CM | POA: Diagnosis not present

## 2024-04-27 DIAGNOSIS — I10 Essential (primary) hypertension: Secondary | ICD-10-CM | POA: Diagnosis not present

## 2024-04-27 DIAGNOSIS — Z01812 Encounter for preprocedural laboratory examination: Secondary | ICD-10-CM | POA: Insufficient documentation

## 2024-04-27 DIAGNOSIS — G4730 Sleep apnea, unspecified: Secondary | ICD-10-CM | POA: Diagnosis not present

## 2024-04-27 DIAGNOSIS — Z01818 Encounter for other preprocedural examination: Secondary | ICD-10-CM

## 2024-04-27 DIAGNOSIS — F32A Depression, unspecified: Secondary | ICD-10-CM | POA: Diagnosis not present

## 2024-04-27 DIAGNOSIS — Z87891 Personal history of nicotine dependence: Secondary | ICD-10-CM | POA: Diagnosis not present

## 2024-04-27 DIAGNOSIS — G8929 Other chronic pain: Secondary | ICD-10-CM | POA: Diagnosis not present

## 2024-04-27 DIAGNOSIS — N941 Unspecified dyspareunia: Secondary | ICD-10-CM | POA: Diagnosis not present

## 2024-04-27 DIAGNOSIS — D649 Anemia, unspecified: Secondary | ICD-10-CM | POA: Diagnosis not present

## 2024-04-27 DIAGNOSIS — F419 Anxiety disorder, unspecified: Secondary | ICD-10-CM | POA: Diagnosis not present

## 2024-04-27 DIAGNOSIS — N80352 Endometriosis of the left pelvic sidewall, unspecified depth: Secondary | ICD-10-CM | POA: Diagnosis not present

## 2024-04-27 DIAGNOSIS — R519 Headache, unspecified: Secondary | ICD-10-CM | POA: Diagnosis not present

## 2024-04-27 DIAGNOSIS — N80329 Endometriosis of the posterior cul-de-sac, unspecified depth: Secondary | ICD-10-CM | POA: Diagnosis not present

## 2024-04-27 DIAGNOSIS — N809 Endometriosis, unspecified: Secondary | ICD-10-CM | POA: Diagnosis not present

## 2024-04-27 DIAGNOSIS — N946 Dysmenorrhea, unspecified: Secondary | ICD-10-CM | POA: Diagnosis not present

## 2024-04-27 DIAGNOSIS — N938 Other specified abnormal uterine and vaginal bleeding: Secondary | ICD-10-CM | POA: Diagnosis not present

## 2024-04-27 DIAGNOSIS — K219 Gastro-esophageal reflux disease without esophagitis: Secondary | ICD-10-CM | POA: Diagnosis not present

## 2024-04-27 DIAGNOSIS — R102 Pelvic and perineal pain: Secondary | ICD-10-CM | POA: Diagnosis not present

## 2024-04-27 DIAGNOSIS — J45909 Unspecified asthma, uncomplicated: Secondary | ICD-10-CM | POA: Diagnosis not present

## 2024-04-27 LAB — URINALYSIS, ROUTINE W REFLEX MICROSCOPIC
Bacteria, UA: NONE SEEN
Bilirubin Urine: NEGATIVE
Glucose, UA: NEGATIVE mg/dL
Ketones, ur: NEGATIVE mg/dL
Leukocytes,Ua: NEGATIVE
Nitrite: NEGATIVE
Protein, ur: NEGATIVE mg/dL
Specific Gravity, Urine: 1.014 (ref 1.005–1.030)
pH: 5 (ref 5.0–8.0)

## 2024-04-27 LAB — CBC
HCT: 40.7 % (ref 36.0–46.0)
Hemoglobin: 13.1 g/dL (ref 12.0–15.0)
MCH: 30.4 pg (ref 26.0–34.0)
MCHC: 32.2 g/dL (ref 30.0–36.0)
MCV: 94.4 fL (ref 80.0–100.0)
Platelets: 297 K/uL (ref 150–400)
RBC: 4.31 MIL/uL (ref 3.87–5.11)
RDW: 13 % (ref 11.5–15.5)
WBC: 5.9 K/uL (ref 4.0–10.5)
nRBC: 0 % (ref 0.0–0.2)

## 2024-04-27 LAB — COMPREHENSIVE METABOLIC PANEL WITH GFR
ALT: 25 U/L (ref 0–44)
AST: 17 U/L (ref 15–41)
Albumin: 4.1 g/dL (ref 3.5–5.0)
Alkaline Phosphatase: 62 U/L (ref 38–126)
Anion gap: 13 (ref 5–15)
BUN: 11 mg/dL (ref 6–20)
CO2: 19 mmol/L — ABNORMAL LOW (ref 22–32)
Calcium: 9.5 mg/dL (ref 8.9–10.3)
Chloride: 111 mmol/L (ref 98–111)
Creatinine, Ser: 0.9 mg/dL (ref 0.44–1.00)
GFR, Estimated: >60 mL/min (ref >60)
Glucose, Bld: 84 mg/dL (ref 70–99)
Potassium: 3.5 mmol/L (ref 3.5–5.1)
Sodium: 143 mmol/L (ref 135–145)
Total Bilirubin: 0.6 mg/dL (ref 0.0–1.2)
Total Protein: 7.3 g/dL (ref 6.5–8.1)

## 2024-04-27 LAB — TYPE AND SCREEN
ABO/RH(D): A POS
Antibody Screen: NEGATIVE

## 2024-04-27 LAB — RAPID HIV SCREEN (HIV 1/2 AB+AG)
HIV 1/2 Antibodies: NONREACTIVE
HIV-1 P24 Antigen - HIV24: NONREACTIVE

## 2024-04-27 LAB — PREGNANCY, URINE: Preg Test, Ur: NEGATIVE

## 2024-04-27 NOTE — Patient Instructions (Signed)
 Tara Singleton  04/27/2024     @PREFPERIOPPHARMACY @   Your procedure is scheduled on  04/28/2024.   Report to Tara Singleton at 0840 A.M.   Call this number if you have problems the morning of surgery:  [phone]  If you experience any cold or flu symptoms such as cough, fever, chills, shortness of breath, etc. between now and your scheduled surgery, please notify us  at the above number.   Remember:        Your last dose of wegovy  should be on 04/20/2024.        Use your inhaler before you come and bring your rescue inhaler with you.   Do not eat after midnight.   You may drink clear liquids until 0640 am on 04/28/2024.    Clear liquids allowed are:                    Water , Juice (No red color; non-citric and without pulp; diabetics please choose diet or no sugar options), Carbonated beverages (diabetics please choose diet or no sugar options), Clear Tea (No creamer, milk, or cream, including half & half and powdered creamer), Black Coffee Only (No creamer, milk or cream, including half & half and powdered creamer), and Clear Sports drink (No red color; diabetics please choose diet or no sugar options)           At 0640 am on 04/28/2024 drink your carb drink. You can have nothing else after this.   Take these medicines the morning of surgery with A SIP OF WATER         abilify, fluoxetine, ketoralac, lamotrigine, maxalt (if needed), verapamil , zofran  (if needed).    Do not wear jewelry, make-up or nail polish, including gel polish,  artificial nails, or any other type of covering on natural nails (fingers and  toes).  Do not wear lotions, powders, or perfumes, or deodorant.  Do not shave 48 hours prior to surgery.  Men may shave face and neck.  Do not bring valuables to the hospital.  [facility] is not responsible for any belongings or valuables.  Contacts, dentures or bridgework may not be worn into surgery.  Leave your suitcase in the car.  After surgery it may be  brought to your room.  For patients admitted to the hospital, discharge time will be determined by your treatment team.  Patients discharged the day of surgery will not be allowed to drive home and must have someone with them for 24 hours.    Special instructions:   DO NOT smoke tobacco or vape for 24 hours before your procedure.  Please read over the following fact sheets that you were given. Pain Booklet, Coughing and Deep Breathing, Surgical Site Infection Prevention, Anesthesia Post-op Instructions, and Care and Recovery After Surgery      Laparoscopically Assisted Vaginal Hysterectomy, Care After After a LAVH, it's common to have soreness and numbness in your incision areas. You'll have pain in your belly. You may also have: Bleeding and discharge from the vagina. Tiredness. Sadness and other emotions. If your ovaries were taken out, you may also have symptoms of menopause, such as hot flashes, night sweats, and lack of sleep. Follow these instructions at home: Medicines Take over-the-counter and prescription medicines only as told by your health care provider. If you were given antibiotics, take them as told by your provider. Do not stop taking the antibiotic even if you start to feel better. Ask your provider  if the medicine prescribed to you: Requires you to avoid driving or using machinery. Can cause constipation. You may need to take these actions to prevent or treat constipation: Drink enough fluid to keep your pee (urine) pale yellow. Take over-the-counter or prescription medicines. Eat foods that are high in fiber, such as beans, whole grains, and fresh fruits and vegetables. Limit foods that are high in fat and processed sugars, such as fried or sweet foods. Incision care  Follow instructions from your provider about how to take care of your incisions. Make sure you: Wash your hands with soap and water  for at least 20 seconds before and after you change your bandage.  If soap and water  aren't available, use hand sanitizer. Change your dressing as told by your provider. Leave stitches, skin glue, or tape strips in place. These skin closures may need to stay in place for 2 weeks or longer. If tape strip edges start to loosen and curl up, you may trim the loose edges. Do not remove tape strips completely unless your provider tells you to do that. Check your incision areas every day for signs of infection. Check for: More redness, swelling, or pain. More fluid or blood. Warmth. Pus or a bad smell. Activity  Rest as told by your provider. Do not sit for a long time without moving. Get up to take short walks every 1-2 hours. This will improve blood flow and breathing. Ask for help if you feel weak or unsteady. You may have to avoid lifting. Ask your provider how much you can safely lift. Return to your normal activities as told by your provider. Ask your provider what activities are safe for you. Lifestyle Do not use any products that contain nicotine or tobacco. These products include cigarettes, chewing tobacco, and vaping devices, such as e-cigarettes. These can delay healing after surgery. If you need help quitting, ask your provider. Do not drink alcohol until your provider approves. General instructions Do not douche, use tampons, have sex, or put anything in the vagina for at least 6 weeks. If you struggle with physical or emotional changes after your procedure, speak with your provider or a therapist. Do not take baths, swim, or use a hot tub until your provider approves. Ask your provider if you may take showers. You may only be allowed to take sponge baths. Try to have someone at home with you for the first 1-2 weeks to help with your daily chores. Wear compression stockings as told by your provider. These stockings help to prevent blood clots and reduce swelling in your legs. Your provider may give you more instructions. Make sure you know what you can  and can't do. Contact a health care provider if: You have any signs of infection. You have pain and your pain medicine doesn't help. You feel dizzy or light-headed. You have trouble peeing. You vomit or feel like you may vomit, and the symptoms do not go away. You have pus or discharge from your vagina that smells bad. Get help right away if: You have a fever and your symptoms suddenly get worse. You have very bad pain in the abdomen. You have chest pain or shortness of breath. You faint. You have pain, swelling, or redness in your leg. You have heavy bleeding in your vagina that soaks through a pad in less than 1 hour. You see blood clots in your bleeding. These symptoms may be an emergency. Get help right away. Call 911. Do not wait to see  if the symptoms will go away. Do not drive yourself to the hospital. This information is not intended to replace advice given to you by your health care provider. Make sure you discuss any questions you have with your health care provider. Document Revised: 12/06/2022 Document Reviewed: 12/06/2022 Elsevier Patient Education  2024 [employer]How to Use Chlorhexidine  at Home in the Shower Chlorhexidine  gluconate (CHG) is a germ-killing (antiseptic) wash that's used to clean the skin. It can get rid of the germs that normally live on the skin and can keep them away for about 24 hours. If you're having surgery, you may be told to shower with CHG at home the night before surgery. This can help lower your risk for infection. To use CHG wash in the shower, follow the steps below. Supplies needed: CHG body wash. Clean washcloth. Clean towel. How to use CHG in the shower Follow these steps unless you're told to use CHG in a different way: Start the shower. Use your normal soap and shampoo to wash your face and hair. Turn off the shower or move out of the shower stream. Pour CHG onto a clean washcloth. Do not use any type of brush or rough  sponge. Start at your neck, washing your body down to your toes. Make sure you: Wash the part of your body where the surgery will be done for at least 1 minute. Do not scrub. Do not use CHG on your head or face unless your health care provider tells you to. If it gets into your ears or eyes, rinse them well with water . Do not wash your genitals with CHG. Wash your back and under your arms. Make sure to wash skin folds. Let the CHG sit on your skin for 1-2 minutes or as long as told. Rinse your entire body in the shower, including all body creases and folds. Turn off the shower. Dry off with a clean towel. Do not put anything on your skin afterward, such as powder, lotion, or perfume. Put on clean clothes or pajamas. If it's the night before surgery, sleep in clean sheets. General tips Use CHG only as told, and follow the instructions on the label. Use the full amount of CHG as told. This is often one bottle. Do not smoke and stay away from flames after using CHG. Your skin may feel sticky after using CHG. This is normal. The sticky feeling will go away as the CHG dries. Do not use CHG: If you have a chlorhexidine  allergy or have reacted to chlorhexidine  in the past. On open wounds or areas of skin that have broken skin, cuts, or scrapes. On babies younger than 48 months of age. Contact a health care provider if: You have questions about using CHG. Your skin gets irritated or itchy. You have a rash after using CHG. You swallow any CHG. Call your local poison control center [phone] in the [place] Your eyes itch badly, or they become very red or swollen. Your hearing changes. You have trouble seeing. If you can't reach your provider, go to an urgent care or emergency room. Do not drive yourself. Get help right away if: You have swelling or tingling in your mouth or throat. You make high-pitched whistling sounds when you breathe, most often when you breathe out (wheeze). You have  trouble breathing. These symptoms may be an emergency. Call 911 right away. Do not wait to see if the symptoms will go away. Do not drive yourself to the hospital. This information is  not intended to replace advice given to you by your health care provider. Make sure you discuss any questions you have with your health care provider. Document Revised: 03/11/2023 Document Reviewed: 03/07/2022 Elsevier Patient Education  2024 [employer].

## 2024-04-28 ENCOUNTER — Ambulatory Visit (HOSPITAL_COMMUNITY): Payer: Self-pay | Admitting: Anesthesiology

## 2024-04-28 ENCOUNTER — Encounter (HOSPITAL_COMMUNITY): Payer: Self-pay | Admitting: Obstetrics & Gynecology

## 2024-04-28 ENCOUNTER — Ambulatory Visit (HOSPITAL_COMMUNITY)
Admission: RE | Admit: 2024-04-28 | Discharge: 2024-04-28 | Disposition: A | Discharge status: Home / Self Care | Attending: Obstetrics & Gynecology | Admitting: Obstetrics & Gynecology

## 2024-04-28 ENCOUNTER — Encounter (HOSPITAL_COMMUNITY)
Admission: RE | Disposition: A | Discharge status: Home / Self Care | Payer: Self-pay | Source: Home / Self Care | Attending: Obstetrics & Gynecology

## 2024-04-28 DIAGNOSIS — E88819 Insulin resistance, unspecified: Secondary | ICD-10-CM

## 2024-04-28 DIAGNOSIS — Z87891 Personal history of nicotine dependence: Secondary | ICD-10-CM | POA: Diagnosis not present

## 2024-04-28 DIAGNOSIS — J45909 Unspecified asthma, uncomplicated: Secondary | ICD-10-CM | POA: Insufficient documentation

## 2024-04-28 DIAGNOSIS — N80352 Endometriosis of the left pelvic sidewall, unspecified depth: Secondary | ICD-10-CM | POA: Diagnosis not present

## 2024-04-28 DIAGNOSIS — D649 Anemia, unspecified: Secondary | ICD-10-CM | POA: Diagnosis not present

## 2024-04-28 DIAGNOSIS — G8929 Other chronic pain: Secondary | ICD-10-CM | POA: Insufficient documentation

## 2024-04-28 DIAGNOSIS — K219 Gastro-esophageal reflux disease without esophagitis: Secondary | ICD-10-CM | POA: Diagnosis not present

## 2024-04-28 DIAGNOSIS — N920 Excessive and frequent menstruation with regular cycle: Secondary | ICD-10-CM | POA: Diagnosis not present

## 2024-04-28 DIAGNOSIS — N938 Other specified abnormal uterine and vaginal bleeding: Secondary | ICD-10-CM | POA: Diagnosis not present

## 2024-04-28 DIAGNOSIS — N80329 Endometriosis of the posterior cul-de-sac, unspecified depth: Secondary | ICD-10-CM | POA: Diagnosis not present

## 2024-04-28 DIAGNOSIS — N946 Dysmenorrhea, unspecified: Secondary | ICD-10-CM | POA: Diagnosis not present

## 2024-04-28 DIAGNOSIS — F419 Anxiety disorder, unspecified: Secondary | ICD-10-CM | POA: Diagnosis not present

## 2024-04-28 DIAGNOSIS — N941 Unspecified dyspareunia: Secondary | ICD-10-CM | POA: Diagnosis not present

## 2024-04-28 DIAGNOSIS — G4730 Sleep apnea, unspecified: Secondary | ICD-10-CM | POA: Diagnosis not present

## 2024-04-28 DIAGNOSIS — R102 Pelvic and perineal pain: Secondary | ICD-10-CM | POA: Insufficient documentation

## 2024-04-28 DIAGNOSIS — N809 Endometriosis, unspecified: Secondary | ICD-10-CM | POA: Diagnosis not present

## 2024-04-28 DIAGNOSIS — I10 Essential (primary) hypertension: Secondary | ICD-10-CM | POA: Diagnosis not present

## 2024-04-28 DIAGNOSIS — F32A Depression, unspecified: Secondary | ICD-10-CM | POA: Insufficient documentation

## 2024-04-28 DIAGNOSIS — R519 Headache, unspecified: Secondary | ICD-10-CM | POA: Diagnosis not present

## 2024-04-28 HISTORY — PX: ROBOTIC ASSISTED TOTAL HYSTERECTOMY WITH BILATERAL SALPINGO OOPHERECTOMY: SHX6086

## 2024-04-28 LAB — ABO/RH: ABO/RH(D): A POS

## 2024-04-28 SURGERY — HYSTERECTOMY, TOTAL, ROBOT-ASSISTED, LAPAROSCOPIC, WITH BILATERAL SALPINGO-OOPHORECTOMY
Anesthesia: General | Site: Abdomen

## 2024-04-28 MED ORDER — FENTANYL CITRATE (PF) 250 MCG/5ML IJ SOLN
INTRAMUSCULAR | Status: AC
  Filled 2024-04-28: qty 5

## 2024-04-28 MED ORDER — POVIDONE-IODINE 10 % EX SWAB: 2.0000 | Freq: Once | CUTANEOUS | Status: DC

## 2024-04-28 MED ORDER — ROCURONIUM BROMIDE 10 MG/ML (PF) SYRINGE
PREFILLED_SYRINGE | INTRAVENOUS | Status: AC
  Filled 2024-04-28: qty 10

## 2024-04-28 MED ORDER — PROPOFOL 10 MG/ML IV BOLUS
INTRAVENOUS | Status: DC | PRN
  Administered 2024-04-28: 50 ug/kg/min via INTRAVENOUS
  Administered 2024-04-28: 200 mg via INTRAVENOUS

## 2024-04-28 MED ORDER — DEXAMETHASONE SODIUM PHOSPHATE 10 MG/ML IJ SOLN
INTRAMUSCULAR | Status: AC
  Filled 2024-04-28: qty 1

## 2024-04-28 MED ORDER — MIDAZOLAM HCL 2 MG/2ML IJ SOLN
INTRAMUSCULAR | Status: DC | PRN
Start: 2024-04-28 — End: 2024-04-28
  Administered 2024-04-28: 2 mg via INTRAVENOUS

## 2024-04-28 MED ORDER — ALBUTEROL SULFATE HFA 108 (90 BASE) MCG/ACT IN AERS
INHALATION_SPRAY | RESPIRATORY_TRACT | Status: DC | PRN
  Administered 2024-04-28: 4 via RESPIRATORY_TRACT

## 2024-04-28 MED ORDER — ONDANSETRON HCL 4 MG/2ML IJ SOLN
INTRAMUSCULAR | Status: AC
  Filled 2024-04-28: qty 2

## 2024-04-28 MED ORDER — OXYCODONE HCL 5 MG/5ML PO SOLN: 5.0000 mg | Freq: Once | ORAL | Status: AC | PRN

## 2024-04-28 MED ORDER — PROPOFOL 10 MG/ML IV BOLUS
INTRAVENOUS | Status: AC
  Filled 2024-04-28: qty 20

## 2024-04-28 MED ORDER — BUPIVACAINE HCL 0.25 % IJ SOLN
INTRAMUSCULAR | Status: DC | PRN
  Administered 2024-04-28: 40 mL

## 2024-04-28 MED ORDER — SUGAMMADEX SODIUM 200 MG/2ML IV SOLN
INTRAVENOUS | Status: AC
  Filled 2024-04-28: qty 4

## 2024-04-28 MED ORDER — OXYCODONE-ACETAMINOPHEN 7.5-325 MG PO TABS: 1.0000 | ORAL_TABLET | Freq: Four times a day (QID) | ORAL | 0 refills | Status: DC | PRN

## 2024-04-28 MED ORDER — ONDANSETRON 8 MG PO TBDP: 8.0000 mg | ORAL_TABLET | Freq: Three times a day (TID) | ORAL | 0 refills | Status: DC | PRN

## 2024-04-28 MED ORDER — STERILE WATER FOR IRRIGATION IR SOLN
Status: DC | PRN
  Administered 2024-04-28: 500 mL

## 2024-04-28 MED ORDER — DEXMEDETOMIDINE HCL IN NACL 80 MCG/20ML IV SOLN
INTRAVENOUS | Status: DC | PRN
Start: 2024-04-28 — End: 2024-04-28
  Administered 2024-04-28: 12 ug via INTRAVENOUS

## 2024-04-28 MED ORDER — SODIUM CHLORIDE 0.9 % IR SOLN
Status: DC | PRN
  Administered 2024-04-28: 3000 mL

## 2024-04-28 MED ORDER — CEFAZOLIN SODIUM-DEXTROSE 2-4 GM/100ML-% IV SOLN
INTRAVENOUS | Status: AC
  Filled 2024-04-28: qty 100

## 2024-04-28 MED ORDER — ROCURONIUM BROMIDE 10 MG/ML (PF) SYRINGE
PREFILLED_SYRINGE | INTRAVENOUS | Status: DC | PRN
  Administered 2024-04-28: 80 mg via INTRAVENOUS
  Administered 2024-04-28: 20 mg via INTRAVENOUS
  Administered 2024-04-28: 50 mg via INTRAVENOUS

## 2024-04-28 MED ORDER — DEXAMETHASONE SODIUM PHOSPHATE 10 MG/ML IJ SOLN
INTRAMUSCULAR | Status: DC | PRN
Start: 2024-04-28 — End: 2024-04-28
  Administered 2024-04-28: 10 mg via INTRAVENOUS

## 2024-04-28 MED ORDER — SUGAMMADEX SODIUM 200 MG/2ML IV SOLN
INTRAVENOUS | Status: DC | PRN
  Administered 2024-04-28: 400 mg via INTRAVENOUS

## 2024-04-28 MED ORDER — LIDOCAINE 2% (20 MG/ML) 5 ML SYRINGE
INTRAMUSCULAR | Status: AC
  Filled 2024-04-28: qty 5

## 2024-04-28 MED ORDER — ACETAMINOPHEN 10 MG/ML IV SOLN
INTRAVENOUS | Status: AC
  Filled 2024-04-28: qty 100

## 2024-04-28 MED ORDER — CHLORHEXIDINE GLUCONATE 0.12 % MT SOLN: 15.0000 mL | Freq: Once | OROMUCOSAL | Status: DC

## 2024-04-28 MED ORDER — FENTANYL CITRATE (PF) 250 MCG/5ML IJ SOLN
INTRAMUSCULAR | Status: DC | PRN
  Administered 2024-04-28 (×2): 100 ug via INTRAVENOUS
  Administered 2024-04-28: 50 ug via INTRAVENOUS

## 2024-04-28 MED ORDER — PROPOFOL 500 MG/50ML IV EMUL
INTRAVENOUS | Status: AC
Start: 2024-04-28 — End: 2024-04-28
  Filled 2024-04-28: qty 50

## 2024-04-28 MED ORDER — LACTATED RINGERS IV SOLN: INTRAVENOUS | Status: DC

## 2024-04-28 MED ORDER — ROCURONIUM BROMIDE 10 MG/ML (PF) SYRINGE
PREFILLED_SYRINGE | INTRAVENOUS | Status: AC
Start: 2024-04-28 — End: 2024-04-28
  Filled 2024-04-28: qty 10

## 2024-04-28 MED ORDER — BUPIVACAINE HCL (PF) 0.25 % IJ SOLN
INTRAMUSCULAR | Status: AC
  Filled 2024-04-28: qty 60

## 2024-04-28 MED ORDER — MIDAZOLAM HCL 2 MG/2ML IJ SOLN
INTRAMUSCULAR | Status: AC
  Filled 2024-04-28: qty 2

## 2024-04-28 MED ORDER — CIPROFLOXACIN HCL 500 MG PO TABS: 500.0000 mg | ORAL_TABLET | Freq: Two times a day (BID) | ORAL | 0 refills | Status: DC

## 2024-04-28 MED ORDER — CEFAZOLIN SODIUM-DEXTROSE 1-4 GM/50ML-% IV SOLN
INTRAVENOUS | Status: AC
  Filled 2024-04-28: qty 50

## 2024-04-28 MED ORDER — KETOROLAC TROMETHAMINE 10 MG PO TABS: 10.0000 mg | ORAL_TABLET | Freq: Three times a day (TID) | ORAL | 0 refills | Status: DC | PRN

## 2024-04-28 MED ORDER — LIDOCAINE 2% (20 MG/ML) 5 ML SYRINGE
INTRAMUSCULAR | Status: DC | PRN
  Administered 2024-04-28: 100 mg via INTRAVENOUS

## 2024-04-28 MED ORDER — CEFAZOLIN SODIUM-DEXTROSE 2-4 GM/100ML-% IV SOLN
2.0000 g | INTRAVENOUS | Status: AC
  Administered 2024-04-28: 3 g via INTRAVENOUS

## 2024-04-28 MED ORDER — FENTANYL CITRATE PF 50 MCG/ML IJ SOSY
25.0000 ug | PREFILLED_SYRINGE | INTRAMUSCULAR | Status: DC | PRN
  Administered 2024-04-28: 50 ug via INTRAVENOUS
  Filled 2024-04-28: qty 1

## 2024-04-28 MED ORDER — 0.9 % SODIUM CHLORIDE (POUR BTL) OPTIME
TOPICAL | Status: DC | PRN
  Administered 2024-04-28: 500 mL

## 2024-04-28 MED ORDER — ONDANSETRON HCL 4 MG/2ML IJ SOLN
4.0000 mg | Freq: Once | INTRAMUSCULAR | Status: AC | PRN
  Administered 2024-04-28: 4 mg via INTRAVENOUS

## 2024-04-28 MED ORDER — ACETAMINOPHEN 10 MG/ML IV SOLN
INTRAVENOUS | Status: DC | PRN
  Administered 2024-04-28: 1000 mg via INTRAVENOUS

## 2024-04-28 MED ORDER — LACTATED RINGERS IV SOLN: INTRAVENOUS | Status: DC | PRN

## 2024-04-28 MED ORDER — KETOROLAC TROMETHAMINE 30 MG/ML IJ SOLN
30.0000 mg | INTRAMUSCULAR | Status: AC
  Administered 2024-04-28: 30 mg via INTRAVENOUS
  Filled 2024-04-28: qty 1

## 2024-04-28 MED ORDER — ORAL CARE MOUTH RINSE: 15.0000 mL | Freq: Once | OROMUCOSAL | Status: DC

## 2024-04-28 MED ORDER — OXYCODONE HCL 5 MG PO TABS
5.0000 mg | ORAL_TABLET | Freq: Once | ORAL | Status: AC | PRN
  Administered 2024-04-28: 5 mg via ORAL
  Filled 2024-04-28: qty 1

## 2024-04-28 SURGICAL SUPPLY — 49 items
APPLICATOR COTTON TIP 6 STRL (MISCELLANEOUS) ×1 IMPLANT
BLADE SURG SZ11 CARB STEEL (BLADE) ×1 IMPLANT
CAUTERY HOOK MNPLR 1.6 DVNC XI (INSTRUMENTS) ×1 IMPLANT
COVER LIGHT HANDLE STERIS (MISCELLANEOUS) ×2 IMPLANT
COVER MAYO STAND XLG (MISCELLANEOUS) ×1 IMPLANT
DERMABOND ADVANCED .7 DNX12 (GAUZE/BANDAGES/DRESSINGS) ×1 IMPLANT
DRAPE ARM DVNC X/XI (DISPOSABLE) ×4 IMPLANT
DRAPE COLUMN DVNC XI (DISPOSABLE) ×1 IMPLANT
DRIVER NDL MEGA SUTCUT DVNCXI (INSTRUMENTS) ×1 IMPLANT
DRIVER NDLE MEGA SUTCUT DVNCXI (INSTRUMENTS) ×1 IMPLANT
ELECTRODE REM PT RTRN 9FT ADLT (ELECTROSURGICAL) ×1 IMPLANT
FORCEPS PROGRASP DVNC XI (FORCEP) ×1 IMPLANT
GAUZE 4X4 16PLY ~~LOC~~+RFID DBL (SPONGE) ×2 IMPLANT
GLOVE BIOGEL PI IND STRL 7.0 (GLOVE) ×4 IMPLANT
GLOVE BIOGEL PI IND STRL 8 (GLOVE) ×2 IMPLANT
GLOVE ECLIPSE 8.0 STRL XLNG CF (GLOVE) ×3 IMPLANT
GOWN STRL REUS W/TWL LRG LVL3 (GOWN DISPOSABLE) ×2 IMPLANT
GOWN STRL REUS W/TWL XL LVL3 (GOWN DISPOSABLE) ×2 IMPLANT
KIT PINK PAD W/HEAD ARM REST (MISCELLANEOUS) ×1 IMPLANT
KIT TURNOVER CYSTO (KITS) ×1 IMPLANT
MANIFOLD NEPTUNE II (INSTRUMENTS) ×1 IMPLANT
NDL HYPO 21X1.5 SAFETY (NEEDLE) ×1 IMPLANT
NDL INSUFFLATION 14GA 120MM (NEEDLE) ×1 IMPLANT
NEEDLE HYPO 21X1.5 SAFETY (NEEDLE) ×1 IMPLANT
NEEDLE INSUFFLATION 14GA 120MM (NEEDLE) ×1 IMPLANT
NS IRRIG 500ML POUR BTL (IV SOLUTION) ×1 IMPLANT
OBTURATOR OPTICALSTD 8 DVNC (TROCAR) ×1 IMPLANT
PACK PERI GYN (CUSTOM PROCEDURE TRAY) ×1 IMPLANT
RUMI II GYRUS 3.5CM BLUE (DISPOSABLE) IMPLANT
SEAL UNIV 5-12 XI (MISCELLANEOUS) ×3 IMPLANT
SEALER VESSEL EXT DVNC XI (MISCELLANEOUS) ×1 IMPLANT
SET BASIN LINEN APH (SET/KITS/TRAYS/PACK) ×1 IMPLANT
SET IRRIG TUBING LAPAROSCOPIC (IRRIGATION / IRRIGATOR) ×1 IMPLANT
SET TRI-LUMEN FLTR TB AIRSEAL (TUBING) ×1 IMPLANT
SET TUBE IRRIG SUCTION NO TIP (IRRIGATION / IRRIGATOR) ×1 IMPLANT
SOLUTION ANTFG W/FOAM PAD STRL (MISCELLANEOUS) ×1 IMPLANT
SPONGE T-LAP 18X18 ~~LOC~~+RFID (SPONGE) ×1 IMPLANT
SUT VICRYL 0 AB UR-6 (SUTURE) ×1 IMPLANT
SUT VICRYL AB 3-0 FS1 BRD 27IN (SUTURE) ×1 IMPLANT
SUTURE STRATFX 0 PDS+ CT-2 23 (SUTURE) ×1 IMPLANT
SYR 10ML LL (SYRINGE) ×2 IMPLANT
SYR 50ML LL SCALE MARK (SYRINGE) ×2 IMPLANT
SYR CONTROL 10ML LL (SYRINGE) ×2 IMPLANT
TIP RUMI ORANGE 6.7MMX12CM (TIP) ×1 IMPLANT
TIP UTERINE 6.7X8CM BLUE DISP (MISCELLANEOUS) IMPLANT
TRAY FOLEY SLVR 16FR LF STAT (SET/KITS/TRAYS/PACK) ×1 IMPLANT
TROCAR KII 8X100ML NONTHREADED (TROCAR) ×1 IMPLANT
TROCAR PORT AIRSEAL 8X120 (TROCAR) ×1 IMPLANT
WATER STERILE IRR 500ML POUR (IV SOLUTION) ×1 IMPLANT

## 2024-04-28 NOTE — Transfer of Care (Signed)
 Immediate Anesthesia Transfer of Care Note  Patient: Tara Singleton  Procedure(s) Performed: HYSTERECTOMY, TOTAL, ROBOT-ASSISTED, LAPAROSCOPIC, WITH BILATERAL SALPINGECTOMY (Abdomen)  Patient Location: PACU  Anesthesia Type:General  Level of Consciousness: awake, alert , drowsy, and patient cooperative  Airway & Oxygen Therapy: Patient Spontanous Breathing and Patient connected to nasal cannula oxygen  Post-op Assessment: Report given to RN, Post -op Vital signs reviewed and stable, and Patient moving all extremities X 4  Post vital signs: Reviewed and stable  Last Vitals:  Vitals Value Taken Time  BP 104/66 1605  Temp 36.4 C 04/28/24 16:02  Pulse 79 04/28/24 16:03  Resp 20 04/28/24 16:03  SpO2 86 % 04/28/24 16:03  Vitals shown include unfiled device data.  Last Pain:  Vitals:   04/28/24 1245  TempSrc:   PainSc: 0-No pain      Patients Stated Pain Goal: 6 (04/28/24 1127)  Complications: No notable events documented.

## 2024-04-28 NOTE — Transfer of Care (Deleted)
 Immediate Anesthesia Transfer of Care Note  Patient: Tara Singleton  Procedure(s) Performed: HYSTERECTOMY, TOTAL, ROBOT-ASSISTED, LAPAROSCOPIC, WITH BILATERAL SALPINGO-OOPHORECTOMY  Patient Location: PACU  Anesthesia Type:General  Level of Consciousness: drowsy and patient cooperative  Airway & Oxygen Therapy: Patient Spontanous Breathing and Patient connected to nasal cannula oxygen  Post-op Assessment: Report given to RN and Post -op Vital signs reviewed and stable  Post vital signs: Reviewed and stable  Last Vitals:  Vitals Value Taken Time  BP    Temp 97.6 04/28/24   1238  Pulse    Resp    SpO2      Last Pain:  Vitals:   04/28/24 1127  TempSrc:   PainSc: 3       Patients Stated Pain Goal: 6 (04/28/24 1127)  Complications: No notable events documented.

## 2024-04-28 NOTE — Anesthesia Procedure Notes (Signed)
 Procedure Name: Intubation Date/Time: 04/28/2024 1:25 PM  Performed by: Para Jerelene CROME, CRNAPre-anesthesia Checklist: Patient identified, Emergency Drugs available, Suction available and Patient being monitored Patient Re-evaluated:Patient Re-evaluated prior to induction Oxygen Delivery Method: Circle system utilized Preoxygenation: Pre-oxygenation with 100% oxygen Induction Type: IV induction Ventilation: Mask ventilation without difficulty Grade View: Grade II Tube type: Oral Tube size: 7.5 mm Number of attempts: 1 Airway Equipment and Method: Stylet Placement Confirmation: ETT inserted through vocal cords under direct vision, positive ETCO2, CO2 detector and breath sounds checked- equal and bilateral Secured at: 24 (OETT positioned.24cm at lower lip.) cm Tube secured with: Tape Dental Injury: Teeth and Oropharynx as per pre-operative assessment  Comments: Atraumatic intubation. Lips and teeth remain in preoperative condition.

## 2024-04-28 NOTE — H&P (Signed)
 Preoperative History and Physical  Tara Singleton is a 38 y.o. [id number] with Patient's last menstrual period was 04/25/2024 (exact date). admitted for a RA TLH + BS possilbe BSO.  Long standing dysmenorrhea, deep dyspareunia and known endometriosis. Wants definitive surgical therapy  Tender cul de sac on exam Sonogram normal Remove ovaries if needed based on intra op findings   PMH:    Past Medical History:  Diagnosis Date   Allergy    Age 61   Anxiety    Arthritis    Age 66 via MRI knee   Asthma    B12 deficiency    Constipation    Depression    Endometriosis    GERD (gastroesophageal reflux disease)    Headache    Infertility, female    PCOS (polycystic ovarian syndrome)    Sleep apnea    Swelling of lower extremity     PSH:     Past Surgical History:  Procedure Laterality Date   EXPLORATORY LAPAROTOMY     endometriosis   EXTRACORPOREAL SHOCK WAVE LITHOTRIPSY Left 01/28/2022   Procedure: EXTRACORPOREAL SHOCK WAVE LITHOTRIPSY (ESWL);  Surgeon: Renda Glance, MD;  Location: [facility];  Service: Urology;  Laterality: Left;   LAPAROSCOPIC OVARIAN CYSTECTOMY     ovarian cyst    POb/GynH:      OB History     Gravida  5   Para      Term      Preterm      AB  5   Living         SAB  5   IAB      Ectopic      Multiple      Live Births              SH:   Social History   Tobacco Use   Smoking status: Former    Types: Cigarettes   Smokeless tobacco: Never  Vaping Use   Vaping status: Former  Substance Use Topics   Alcohol use: Never    Comment: occ   Drug use: Never    FH:    Family History  Problem Relation Age of Onset   Obesity Paternal Grandfather    COPD Paternal Grandmother    Obesity Paternal Grandmother    Cancer Maternal Grandmother    Obesity Maternal Grandmother    Asthma Father    Anxiety disorder Father    Sleep apnea Father    Alcoholism Father    Drug abuse Father    Obesity Father     Alcohol abuse Father    High blood pressure Mother    High Cholesterol Mother    Stroke Mother    Thyroid disease Mother    Cancer Mother    Depression Mother    Anxiety disorder Mother    Obesity Mother    Hypertension Mother    Uterine cancer Mother      Allergies:  Allergies  Allergen Reactions   Avocado Hives, Shortness Of Breath, Nausea And Vomiting, Swelling and Rash   Betula Alba Oil Anaphylaxis   Kiwi Extract Hives, Shortness Of Breath, Dermatitis and Rash   Shellfish Allergy Anaphylaxis   Sulfa Antibiotics Hives, Other (See Comments) and Rash    Upset stomach    White Birch Anaphylaxis    Birch trees   Latex Hives   Sulfur Other (See Comments)    Welts   Avocado Extract Allergy Skin Test Hives   Macrobid [Nitrofurantoin] Hives  Mango Flavoring Agent (Non-Screening) Hives    Medications:       Current Facility-Administered Medications:    ceFAZolin  (ANCEF ) IVPB 2g/100 mL premix, 2 g, Intravenous, On Call to OR, Jayne Vonn DEL, MD   chlorhexidine  (PERIDEX ) 0.12 % solution 15 mL, 15 mL, Mouth/Throat, Once **OR** Oral care mouth rinse, 15 mL, Mouth Rinse, Once, Kiel, Yvonna PARAS, MD   ketorolac  (TORADOL ) 30 MG/ML injection 30 mg, 30 mg, Intravenous, On Call to OR, Tajh Livsey, Vonn DEL, MD   lactated ringers  infusion, , Intravenous, Continuous, Kiel, Yvonna PARAS, MD   povidone-iodine  10 % swab 2 Application, 2 Application, Topical, Once, Jayne Vonn DEL, MD  Review of Systems:   Review of Systems  Constitutional: Negative for fever, chills, weight loss, malaise/fatigue and diaphoresis.  HENT: Negative for hearing loss, ear pain, nosebleeds, congestion, sore throat, neck pain, tinnitus and ear discharge.   Eyes: Negative for blurred vision, double vision, photophobia, pain, discharge and redness.  Respiratory: Negative for cough, hemoptysis, sputum production, shortness of breath, wheezing and stridor.   Cardiovascular: Negative for chest pain, palpitations, orthopnea,  claudication, leg swelling and PND.  Gastrointestinal: Positive for abdominal pain. Negative for heartburn, nausea, vomiting, diarrhea, constipation, blood in stool and melena.  Genitourinary: Negative for dysuria, urgency, frequency, hematuria and flank pain.  Musculoskeletal: Negative for myalgias, back pain, joint pain and falls.  Skin: Negative for itching and rash.  Neurological: Negative for dizziness, tingling, tremors, sensory change, speech change, focal weakness, seizures, loss of consciousness, weakness and headaches.  Endo/Heme/Allergies: Negative for environmental allergies and polydipsia. Does not bruise/bleed easily.  Psychiatric/Behavioral: Negative for depression, suicidal ideas, hallucinations, memory loss and substance abuse. The patient is not nervous/anxious and does not have insomnia.      PHYSICAL EXAM:  Temperature (!) 97.5 F (36.4 C), temperature source Oral, height 5' 5 (1.651 m), weight 134.3 kg, last menstrual period 04/25/2024.    Vitals reviewed. Constitutional: She is oriented to person, place, and time. She appears well-developed and well-nourished.  HENT:  Head: Normocephalic and atraumatic.  Right Ear: External ear normal.  Left Ear: External ear normal.  Nose: Nose normal.  Mouth/Throat: Oropharynx is clear and moist.  Eyes: Conjunctivae and EOM are normal. Pupils are equal, round, and reactive to light. Right eye exhibits no discharge. Left eye exhibits no discharge. No scleral icterus.  Neck: Normal range of motion. Neck supple. No tracheal deviation present. No thyromegaly present.  Cardiovascular: Normal rate, regular rhythm, normal heart sounds and intact distal pulses.  Exam reveals no gallop and no friction rub.   No murmur heard. Respiratory: Effort normal and breath sounds normal. No respiratory distress. She has no wheezes. She has no rales. She exhibits no tenderness.  GI: Soft. Bowel sounds are normal. She exhibits no distension and no  mass. There is tenderness. There is no rebound and no guarding.  Genitourinary:       Vulva is normal without lesions Vagina is pink moist without discharge Cervix normal in appearance and pap is normal Uterus is tender in post cul de sac No masses palpable Adnexa is negative with normal sized ovaries by sonogram  Musculoskeletal: Normal range of motion. She exhibits no edema and no tenderness.  Neurological: She is alert and oriented to person, place, and time. She has normal reflexes. She displays normal reflexes. No cranial nerve deficit. She exhibits normal muscle tone. Coordination normal.  Skin: Skin is warm and dry. No rash noted. No erythema. No pallor.  Psychiatric: She has  a normal mood and affect. Her behavior is normal. Judgment and thought content normal.    Labs: Results for orders placed or performed during the hospital encounter of 04/27/24 (from the past 2 weeks)  CBC   Collection Time: 04/27/24  3:43 PM  Result Value Ref Range   WBC 5.9 4.0 - 10.5 K/uL   RBC 4.31 3.87 - 5.11 MIL/uL   Hemoglobin 13.1 12.0 - 15.0 g/dL   HCT 59.2 63.9 - 53.9 %   MCV 94.4 80.0 - 100.0 fL   MCH 30.4 26.0 - 34.0 pg   MCHC 32.2 30.0 - 36.0 g/dL   RDW 86.9 88.4 - 84.4 %   Platelets 297 150 - 400 K/uL   nRBC 0.0 0.0 - 0.2 %  Comprehensive metabolic panel   Collection Time: 04/27/24  3:43 PM  Result Value Ref Range   Sodium 143 135 - 145 mmol/L   Potassium 3.5 3.5 - 5.1 mmol/L   Chloride 111 98 - 111 mmol/L   CO2 19 (L) 22 - 32 mmol/L   Glucose, Bld 84 70 - 99 mg/dL   BUN 11 6 - 20 mg/dL   Creatinine, Ser 9.09 0.44 - 1.00 mg/dL   Calcium  9.5 8.9 - 10.3 mg/dL   Total Protein 7.3 6.5 - 8.1 g/dL   Albumin 4.1 3.5 - 5.0 g/dL   AST 17 15 - 41 U/L   ALT 25 0 - 44 U/L   Alkaline Phosphatase 62 38 - 126 U/L   Total Bilirubin 0.6 0.0 - 1.2 mg/dL   GFR, Estimated >39 >39 mL/min   Anion gap 13 5 - 15  Rapid HIV screen (HIV 1/2 Ab+Ag)   Collection Time: 04/27/24  3:43 PM  Result Value  Ref Range   HIV-1 P24 Antigen - HIV24 NON REACTIVE NON REACTIVE   HIV 1/2 Antibodies NON REACTIVE NON REACTIVE   Interpretation (HIV Ag Ab)      A non reactive test result means that HIV 1 or HIV 2 antibodies and HIV 1 p24 antigen were not detected in the specimen.  Urinalysis, Routine w reflex microscopic -Urine, Clean Catch   Collection Time: 04/27/24  3:43 PM  Result Value Ref Range   Color, Urine YELLOW YELLOW   APPearance CLEAR CLEAR   Specific Gravity, Urine 1.014 1.005 - 1.030   pH 5.0 5.0 - 8.0   Glucose, UA NEGATIVE NEGATIVE mg/dL   Hgb urine dipstick SMALL (A) NEGATIVE   Bilirubin Urine NEGATIVE NEGATIVE   Ketones, ur NEGATIVE NEGATIVE mg/dL   Protein, ur NEGATIVE NEGATIVE mg/dL   Nitrite NEGATIVE NEGATIVE   Leukocytes,Ua NEGATIVE NEGATIVE   RBC / HPF 0-5 0 - 5 RBC/hpf   WBC, UA 0-5 0 - 5 WBC/hpf   Bacteria, UA NONE SEEN NONE SEEN   Squamous Epithelial / HPF 0-5 0 - 5 /HPF  Pregnancy, urine   Collection Time: 04/27/24  3:43 PM  Result Value Ref Range   Preg Test, Ur NEGATIVE NEGATIVE  Type and screen   Collection Time: 04/27/24  3:43 PM  Result Value Ref Range   ABO/RH(D) A POS    Antibody Screen NEG    Sample Expiration 04/30/2024,2359    Extend sample reason      NO TRANSFUSIONS OR PREGNANCY IN THE PAST 3 MONTHS Performed at [facility], [address], [place], [place] [address]   Results for orders placed or performed during the hospital encounter of 04/18/24 (from the past 2 weeks)  Urinalysis, Routine w reflex  microscopic -Urine, Clean Catch   Collection Time: 04/18/24 11:41 AM  Result Value Ref Range   Color, Urine STRAW (A) YELLOW   APPearance CLEAR CLEAR   Specific Gravity, Urine 1.008 1.005 - 1.030   pH 6.0 5.0 - 8.0   Glucose, UA NEGATIVE NEGATIVE mg/dL   Hgb urine dipstick SMALL (A) NEGATIVE   Bilirubin Urine NEGATIVE NEGATIVE   Ketones, ur NEGATIVE NEGATIVE mg/dL   Protein, ur NEGATIVE NEGATIVE mg/dL   Nitrite NEGATIVE NEGATIVE   Leukocytes,Ua  NEGATIVE NEGATIVE   RBC / HPF 0-5 0 - 5 RBC/hpf   WBC, UA 0-5 0 - 5 WBC/hpf   Bacteria, UA NONE SEEN NONE SEEN   Squamous Epithelial / HPF 0-5 0 - 5 /HPF  CBC with Differential   Collection Time: 04/18/24 12:13 PM  Result Value Ref Range   WBC 9.9 4.0 - 10.5 K/uL   RBC 4.21 3.87 - 5.11 MIL/uL   Hemoglobin 13.0 12.0 - 15.0 g/dL   HCT 61.0 63.9 - 53.9 %   MCV 92.4 80.0 - 100.0 fL   MCH 30.9 26.0 - 34.0 pg   MCHC 33.4 30.0 - 36.0 g/dL   RDW 86.7 88.4 - 84.4 %   Platelets 354 150 - 400 K/uL   nRBC 0.0 0.0 - 0.2 %   Neutrophils Relative % 62 %   Neutro Abs 6.2 1.7 - 7.7 K/uL   Lymphocytes Relative 29 %   Lymphs Abs 2.9 0.7 - 4.0 K/uL   Monocytes Relative 6 %   Monocytes Absolute 0.6 0.1 - 1.0 K/uL   Eosinophils Relative 1 %   Eosinophils Absolute 0.1 0.0 - 0.5 K/uL   Basophils Relative 1 %   Basophils Absolute 0.1 0.0 - 0.1 K/uL   Immature Granulocytes 1 %   Abs Immature Granulocytes 0.05 0.00 - 0.07 K/uL  Comprehensive metabolic panel   Collection Time: 04/18/24 12:13 PM  Result Value Ref Range   Sodium 137 135 - 145 mmol/L   Potassium 3.8 3.5 - 5.1 mmol/L   Chloride 103 98 - 111 mmol/L   CO2 23 22 - 32 mmol/L   Glucose, Bld 95 70 - 99 mg/dL   BUN 12 6 - 20 mg/dL   Creatinine, Ser 9.16 0.44 - 1.00 mg/dL   Calcium  9.5 8.9 - 10.3 mg/dL   Total Protein 7.2 6.5 - 8.1 g/dL   Albumin 4.1 3.5 - 5.0 g/dL   AST 19 15 - 41 U/L   ALT 48 (H) 0 - 44 U/L   Alkaline Phosphatase 65 38 - 126 U/L   Total Bilirubin 0.9 0.0 - 1.2 mg/dL   GFR, Estimated >39 >39 mL/min   Anion gap 11 5 - 15  D-dimer, quantitative   Collection Time: 04/18/24 12:13 PM  Result Value Ref Range   D-Dimer, Quant 0.28 0.00 - 0.50 ug/mL-FEU  hCG, quantitative, pregnancy   Collection Time: 04/18/24 12:13 PM  Result Value Ref Range   hCG, Beta Chain, Quant, S <1 <5 mIU/mL  Results for orders placed or performed in visit on 04/16/24 (from the past 2 weeks)  VITAMIN D  25 Hydroxy (Vit-D Deficiency, Fractures)    Collection Time: 04/16/24  9:42 AM  Result Value Ref Range   VITD 40.94 30.00 - 100.00 ng/mL  Comprehensive metabolic panel with GFR   Collection Time: 04/16/24  9:42 AM  Result Value Ref Range   Sodium 140 135 - 145 mEq/L   Potassium 3.9 3.5 - 5.1 mEq/L   Chloride 106  96 - 112 mEq/L   CO2 24 19 - 32 mEq/L   Glucose, Bld 84 70 - 99 mg/dL   BUN 11 6 - 23 mg/dL   Creatinine, Ser 9.22 0.40 - 1.20 mg/dL   Total Bilirubin 0.3 0.2 - 1.2 mg/dL   Alkaline Phosphatase 61 39 - 117 U/L   AST 15 0 - 37 U/L   ALT 33 0 - 35 U/L   Total Protein 6.7 6.0 - 8.3 g/dL   Albumin 4.1 3.5 - 5.2 g/dL   GFR 01.34 >39.99 mL/min   Calcium  8.6 8.4 - 10.5 mg/dL  CBC with Differential/Platelet   Collection Time: 04/16/24  9:42 AM  Result Value Ref Range   WBC 8.3 4.0 - 10.5 K/uL   RBC 4.76 3.87 - 5.11 Mil/uL   Hemoglobin 14.3 12.0 - 15.0 g/dL   HCT 56.0 63.9 - 53.9 %   MCV 92.3 78.0 - 100.0 fl   MCHC 32.5 30.0 - 36.0 g/dL   RDW 85.8 88.4 - 84.4 %   Platelets 339.0 150.0 - 400.0 K/uL   Neutrophils Relative % 54.2 43.0 - 77.0 %   Lymphocytes Relative 39.2 12.0 - 46.0 %   Monocytes Relative 4.9 3.0 - 12.0 %   Eosinophils Relative 1.0 0.0 - 5.0 %   Basophils Relative 0.7 0.0 - 3.0 %   Neutro Abs 4.5 1.4 - 7.7 K/uL   Lymphs Abs 3.3 0.7 - 4.0 K/uL   Monocytes Absolute 0.4 0.1 - 1.0 K/uL   Eosinophils Absolute 0.1 0.0 - 0.7 K/uL   Basophils Absolute 0.1 0.0 - 0.1 K/uL  Lipid panel   Collection Time: 04/16/24  9:42 AM  Result Value Ref Range   Cholesterol 140 0 - 200 mg/dL   Triglycerides 872.9 0.0 - 149.0 mg/dL   HDL 53.39 >60.99 mg/dL   VLDL 74.5 0.0 - 59.9 mg/dL   LDL Cholesterol 68 0 - 99 mg/dL   Total CHOL/HDL Ratio 3    NonHDL 93.71     EKG: Orders placed or performed in visit on 09/01/23   EKG 12-Lead    Imaging Studies: US  PELVIC COMPLETE W TRANSVAGINAL AND TORSION R/O Result Date: 04/18/2024 CLINICAL DATA:  Left/mid pelvic pain, right rib bleeding. EXAM: TRANSABDOMINAL AND  TRANSVAGINAL ULTRASOUND OF PELVIS TECHNIQUE: Both transabdominal and transvaginal ultrasound examinations of the pelvis were performed. Transabdominal technique was performed for global imaging of the pelvis including uterus, ovaries, adnexal regions, and pelvic cul-de-sac. It was necessary to proceed with endovaginal exam following the transabdominal exam to visualize the uterus, endometrium, ovaries and adnexal regions. COMPARISON:  02/12/2024. FINDINGS: Uterus Measurements: 5.6 x 3.7 x 4.6 cm = volume: 50.4 mL. No fibroids or other mass visualized. Endometrium Thickness: 6 mm, within normal limits. Intrauterine contraceptive device is in place. Right ovary Measurements: 3.9 x 2.2 x 2.6 cm = volume: 11.4 mL. Normal appearance/no adnexal mass. Left ovary Measurements: 3.3 x 1.9 x 2.4 cm = volume: 7.7 mL. Normal appearance/no adnexal mass. Other findings No abnormal free fluid. IMPRESSION: 1. Normal exam. 2. Intrauterine contraceptive device in place. Electronically Signed   By: Newell Eke M.D.   On: 04/18/2024 16:47   DG Chest 2 View Result Date: 04/16/2024 CLINICAL DATA:  Cough and wheezing.  Congestion. EXAM: CHEST - 2 VIEW COMPARISON:  None Available. FINDINGS: The heart size and mediastinal contours are within normal limits. Both lungs are clear. The visualized skeletal structures are unremarkable. IMPRESSION: No active cardiopulmonary disease. Electronically Signed   By: Camellia  Minus M.D.   On: 04/16/2024 09:46      Assessment: Dysmenorrhea Chronic pelvic pain Dyspareunia Endometriosis known from laparoscopy Multiple pregnancy losses  Plan: RA TLH + BS possible BSO  Vonn VEAR Inch 04/28/2024 10:39 AM

## 2024-04-28 NOTE — Op Note (Signed)
 Preoperative diagnosis: Dysmenorrhea Dyspareunia Chronic pelvic pain Menorrhagia endometriosis   Postoperative diagnosis: SAA   Procedure: dV5 Robotic hysterectomy with bilateral salpingectomy  Laparoscopic guided transversus abdominus plane block for post op pain management  Surgeon: Vonn VEAR Inch, MD   Anesthesia: General endotracheal   Findings: Essentially normal appearing pelvis some evidence of old endometriosis on the left pelvic sidewall and posterior cul de sac   Description of operation: Patient was taken to the operating room and placed in the low lithotomy position She was prepped and draped in the usual sterile fashion robotic assisted laparoscopic procedure A Foley catheter was placed The vagina was once again prepped additionally   A RUMI II 8 cm with 3.5 cervical cup was placed for uterine manipulation and colpotomy delineation   An incision was made above the umbilicus The umbilical fascia was grasped A varies needle was used and placed into the peritoneum with 1 pass A pneumoperitoneum was created to a pressure of 15   An 8 mm port was placed into the peritoneal cavity using a nonbladed trocar easily with 1 pass using the video laparoscope The peritoneal cavity was confirmed   There were no unusual findings 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   3 additional 8 mm ports were placed at approximately the same level as the supraumbilical port 2 of the ports were robotic and 1 is an assist port   One was left lateral and 1 was placed right lateral, both lateral to the rectus anterior muscle These were placed under direct visualization without difficulty into the peritoneal cavity Nonbladed trocars were used in all instances    The patient was placed in 24 degrees of Trendelenburg   The [employer] robot was then docked to the 3 robotic ports   Instruments used during the robotic hysterectomy: Vessel  sealer extender using bipolar energy ProGrasp forceps with no energy Monopolar hook Megacut needle driver 2-0 PDS symmetrical STRATAFIX on a CT 2 needle   I then left the patient and went to the surgical console   The RUMI II  was used throughout the case to apply traction and anterior/posterior displacement of the uterus to facilitate the robotic procedure The ProGrasp was used to put traction on the left adnexa The left ureter was identified and found to be well away from the adnexal vessels The vessel sealer extender using bipolar energy was used and the utero-ovarian ligament was coagulated and then transected I used traction medially and anteriorly and used the vessel sealer to take the broad ligament and round ligament down to the level of the cervical isthmus just lateral to the left uterine vessels   I then turned my attention to the right adnexa The pro grasp was used and medial and upward traction was placed The vessel sealer extender using bipolar energy was used to coagulate and ligate the right infundibulopelvic ligament vessels Again the right ureter was well inferior to the vessels I continued use medial and anterior retraction using the ProGrasp and the vessel sealer with the bipolar energy was used to take the broad ligament and round ligament down to the level of the cervical isthmus and lateral to the uterine vessels   I then placed the monopolar hook in the place of the ProGrasp The vessel sealer extender was used to grasp the peritoneum of the bladder provide traction, of course no energy was used for this I used the monopolar hook with energy to open  of the peritoneal leaf anteriorly and dissect the bladder peritoneum off the lower uterine segment and cervix beyond the level of the vaginal colpotomy cup I then used the monopolar hook to take the peritoneum down where I stopped using the vessel sealer and met in the bladder dissection bilaterally   The vessel sealer  extender with monopolar energy was used to coagulate the uterine vessels at the level of the cervical isthmus bilaterally and then just above and just below to manage backbleeding The uterine vessels were transected There was good hemostasis I then stayed medial to this uterine vessel pedicle with the vessel sealer extender and took down the paracervical tissue to allow colpotomy incision using the monopolar hook, preserving the cardinal ligament Again there was good hemostasis bilaterally   I then used the monopolar hook and made a posterior colpotomy incision above the level of insertion of the uterosacral ligaments I used a frowny face then smiley face technique and opened the vagina to approximately 8:00 and 4:00 posteriorly I then used the vessel sealer extender with bipolar energy, coagulating and then transecting the vagina   The monopolar hook was used to make the anterior colpotomy incision as the bladder had been dissected well past the colpotomy ring Cephalad tension was placed on the Vcare handle throughout this portion of the procedure to prevent thermal injury to the bladder and safe distance from the ureters bilaterally Colpotomy incision was made from 10:00 to 2:00 The vessel sealer with bipolar energy was then used to coagulate and transect the vagina, again inside of the cardinal ligament   The uterus was removed through the vagina   The Fallopian tubes were also removed through the vagina A wet lap tape was placed in the vagina to maintain pneumoperitoneum   All pedicles were found to be hemostatic there was very little blood loss I then removed the vessel sealer and monopolar hook The pro grasp was replaced and the needle driver was also placed along with the suture   The vagina was closed using the 2-0 PDS STRATAFIX symmetrical suture on the CT 2 needle I pay close attention to the vaginal corners bilaterally and incorporated those in the closure 1.5 cm of vagina was  operating to the vaginal closure I also fixed the uterosacral ligaments to the vaginal cuff repair shortening the uterosacral ligaments thus providing improved vaginal vault suspension The cardinal ligaments were intact again improving postoperative vaginal support Pubocervical rectovaginal and posterior peritoneum were all included in the vaginal closure There was good result hemostasis   At the end of the procedure all the pedicles were identified and found to be hemostatic Both ureters were identified and undergoing normal peristalsis, they were well out of the way of surgical field throughout   I then got up from the console and went back to the side of the patient after gowning and gloving sterilely  I placed a  transversus abdominus plane block was placed using laparoscopic guidance at T10 and T7 bilaterally, 10 cc of 0.25% bupivacaine  at each site, 40 cc total of 100 mg of bupivacaine .  Doyle's bubble technique was used. This was placed for post operative pain management.        The insufflation ports were used to actively desufflate the peritoneal cavity to a pressure of 0 Ports were then removed   The subcutaneous tissue of all 4 incisions was closed using 0 Vicryl All 4 skin incisions were closed using 3-0 vicryl in a subcuticular manner Dermabond was  used all 4 incision sites  Each incision was dressed   The patient tolerated the procedure well She received 2 g of Ancef  and 30 mg of Toradol  preoperatively   EBL: 25 cc   Vonn VEAR Inch, MD 04/28/2024 4:01 PM

## 2024-04-28 NOTE — Anesthesia Preprocedure Evaluation (Signed)
 Anesthesia Evaluation  Patient identified by MRN, date of birth, ID band Patient awake    Reviewed: Allergy & Precautions, H&P , NPO status , Patient's Chart, lab work & pertinent test results, reviewed documented beta blocker date and time   Airway Mallampati: II  TM Distance: >3 FB Neck ROM: full    Dental no notable dental hx.    Pulmonary asthma , sleep apnea , Patient abstained from smoking., former smoker   Pulmonary exam normal breath sounds clear to auscultation       Cardiovascular Exercise Tolerance: Good hypertension,  Rhythm:regular Rate:Normal     Neuro/Psych  Headaches PSYCHIATRIC DISORDERS Anxiety Depression     Neuromuscular disease    GI/Hepatic Neg liver ROS,GERD  ,,  Endo/Other  negative endocrine ROS    Renal/GU negative Renal ROS  negative genitourinary   Musculoskeletal   Abdominal   Peds  Hematology  (+) Blood dyscrasia, anemia   Anesthesia Other Findings   Reproductive/Obstetrics negative OB ROS                              Anesthesia Physical Anesthesia Plan  ASA: 2  Anesthesia Plan: General and General ETT   Post-op Pain Management:    Induction:   PONV Risk Score and Plan: Ondansetron  and Scopolamine patch - Pre-op  Airway Management Planned:   Additional Equipment:   Intra-op Plan:   Post-operative Plan:   Informed Consent: I have reviewed the patients History and Physical, chart, labs and discussed the procedure including the risks, benefits and alternatives for the proposed anesthesia with the patient or authorized representative who has indicated his/her understanding and acceptance.     Dental Advisory Given  Plan Discussed with: CRNA  Anesthesia Plan Comments:         Anesthesia Quick Evaluation

## 2024-04-29 ENCOUNTER — Ambulatory Visit (INDEPENDENT_AMBULATORY_CARE_PROVIDER_SITE_OTHER): Admitting: Family Medicine

## 2024-04-29 ENCOUNTER — Encounter (HOSPITAL_COMMUNITY): Payer: Self-pay | Admitting: Obstetrics & Gynecology

## 2024-04-30 DIAGNOSIS — F330 Major depressive disorder, recurrent, mild: Secondary | ICD-10-CM | POA: Diagnosis not present

## 2024-04-30 LAB — SURGICAL PATHOLOGY

## 2024-05-03 ENCOUNTER — Encounter: Payer: Self-pay | Admitting: *Deleted

## 2024-05-04 ENCOUNTER — Encounter: Payer: Self-pay | Admitting: Obstetrics & Gynecology

## 2024-05-04 ENCOUNTER — Ambulatory Visit: Admitting: Obstetrics & Gynecology

## 2024-05-04 VITALS — BP 124/85 | HR 78 | Ht 65.0 in | Wt 302.0 lb

## 2024-05-04 DIAGNOSIS — Z9889 Other specified postprocedural states: Secondary | ICD-10-CM

## 2024-05-04 DIAGNOSIS — Z48816 Encounter for surgical aftercare following surgery on the genitourinary system: Secondary | ICD-10-CM

## 2024-05-04 MED ORDER — NORETHINDRONE 0.35 MG PO TABS: 1.0000 | ORAL_TABLET | Freq: Every day | ORAL | 11 refills | Status: AC

## 2024-05-04 NOTE — Progress Notes (Signed)
 HPI: Patient returns for routine postoperative follow-up having undergone RA TLH + BS on 04/28/24.  The patient's immediate postoperative recovery has been unremarkable. Since hospital discharge the patient reports no problems.   Current Outpatient Medications: ABILIFY 5 MG tablet, , Disp: , Rfl:  cetirizine (ZYRTEC) 10 MG tablet, Take by mouth., Disp: , Rfl:  ciprofloxacin  (CIPRO ) 500 MG tablet, Take 1 tablet (500 mg total) by mouth 2 (two) times daily., Disp: 14 tablet, Rfl: 0 EPINEPHrine  0.3 mg/0.3 mL IJ SOAJ injection, Inject 0.3 mg into the muscle as needed for anaphylaxis., Disp: 1 each, Rfl: 0 FLUoxetine (PROZAC) 40 MG capsule, Take 40 mg by mouth daily., Disp: , Rfl:   ketorolac  (TORADOL ) 10 MG tablet, Take 1 tablet (10 mg total) by mouth every 6 (six) hours as needed., Disp: 20 tablet, Rfl: 0 ketorolac  (TORADOL ) 10 MG tablet, Take 1 tablet (10 mg total) by mouth every 8 (eight) hours as needed., Disp: 15 tablet, Rfl: 0 lamoTRIgine (LAMICTAL) 100 MG tablet, Take by mouth daily., Disp: , Rfl:  mometasone (NASONEX) 50 MCG/ACT nasal spray, Place 2 sprays into the nose daily., Disp: , Rfl:  norethindrone  (MICRONOR ) 0.35 MG tablet, Take 1 tablet (0.35 mg total) by mouth daily., Disp: 28 tablet, Rfl: 11 ondansetron  (ZOFRAN -ODT) 4 MG disintegrating tablet, Take 1 tablet (4 mg total) by mouth every 12 (twelve) hours., Disp: 20 tablet, Rfl: 1 ondansetron  (ZOFRAN -ODT) 8 MG disintegrating tablet, Take 1 tablet (8 mg total) by mouth every 8 (eight) hours as needed for nausea or vomiting., Disp: 8 tablet, Rfl: 0 PROAIR  HFA 108 (90 Base) MCG/ACT inhaler, Inhale into the lungs., Disp: , Rfl:  promethazine -dextromethorphan (PROMETHAZINE -DM) 6.25-15 MG/5ML syrup, Take 5 mLs by mouth 4 (four) times daily as needed for cough., Disp: 118 mL, Rfl: 0 rizatriptan  (MAXALT ) 10 MG tablet, Take 1 tablet (10 mg total) by mouth as needed for migraine., Disp: 10 tablet, Rfl: 0 rosuvastatin  (CRESTOR ) 10 MG tablet,  Take 1 tablet (10 mg total) by mouth daily., Disp: 90 tablet, Rfl: 1 verapamil  (CALAN -SR) 120 MG CR tablet, Take 1 tablet (120 mg total) by mouth at bedtime., Disp: 30 tablet, Rfl: 4 Vitamin D , Ergocalciferol , (DRISDOL ) 1.25 MG (50000 UNIT) CAPS capsule, Take 1 capsule (50,000 Units total) by mouth every 7 (seven) days., Disp: 12 capsule, Rfl: 0 oxyCODONE -acetaminophen  (PERCOCET) 7.5-325 MG tablet, Take 1 tablet by mouth every 6 (six) hours as needed. (Patient not taking: Reported on 05/04/2024), Disp: 28 tablet, Rfl: 0 Semaglutide -Weight Management (WEGOVY ) 1 MG/0.5ML SOAJ, Inject 1 mg into the skin once a week. (Patient not taking: Reported on 05/04/2024), Disp: 2 mL, Rfl: 0  No current facility-administered medications for this visit.    Blood pressure 124/85, pulse 78, height 5' 5 (1.651 m), weight (!) 302 lb (137 kg), last menstrual period 03/25/2024.  Physical Exam: 4 incisions all look good bruising  Diagnostic Tests:   Pathology: benign  Impression + Management plan:   ICD-10-CM   1. Post-operative state: RA TLH + BS  Z98.890           Medications Prescribed this encounter: Orders Placed This Encounter     norethindrone  (MICRONOR ) 0.35 MG tablet         Sig: Take 1 tablet (0.35 mg total) by mouth daily.         Dispense:  28 tablet         Refill:  11     Follow up: No follow-ups on file.    Vonn VEAR Inch, MD Attending  Physician for the Center for [facility] 05/04/2024 9:22 AM

## 2024-05-04 NOTE — Anesthesia Postprocedure Evaluation (Signed)
 Anesthesia Post Note  Patient: Tara Singleton  Procedure(s) Performed: HYSTERECTOMY, TOTAL, ROBOT-ASSISTED, LAPAROSCOPIC, WITH BILATERAL SALPINGECTOMY (Abdomen)  Patient location during evaluation: Phase II Anesthesia Type: General Level of consciousness: awake Pain management: pain level controlled Vital Signs Assessment: post-procedure vital signs reviewed and stable Respiratory status: spontaneous breathing and respiratory function stable Cardiovascular status: blood pressure returned to baseline and stable Postop Assessment: no headache and no apparent nausea or vomiting Anesthetic complications: no Comments: Late entry   No notable events documented.   Last Vitals:  Vitals:   04/28/24 1654 04/28/24 1700  BP:  105/66  Pulse: 79 85  Resp: 15 18  Temp:  37.1 C  SpO2: 96% 94%    Last Pain:  Vitals:   04/29/24 1421  TempSrc:   PainSc: 2                  Yvonna JINNY Bosworth

## 2024-05-13 ENCOUNTER — Encounter: Payer: Self-pay | Admitting: Obstetrics & Gynecology

## 2024-05-14 DIAGNOSIS — F330 Major depressive disorder, recurrent, mild: Secondary | ICD-10-CM | POA: Diagnosis not present

## 2024-05-18 ENCOUNTER — Ambulatory Visit (INDEPENDENT_AMBULATORY_CARE_PROVIDER_SITE_OTHER): Admitting: Family Medicine

## 2024-05-19 ENCOUNTER — Telehealth (INDEPENDENT_AMBULATORY_CARE_PROVIDER_SITE_OTHER): Payer: Self-pay

## 2024-05-19 ENCOUNTER — Ambulatory Visit (INDEPENDENT_AMBULATORY_CARE_PROVIDER_SITE_OTHER): Admitting: Family Medicine

## 2024-05-19 VITALS — BP 102/70 | HR 85 | Temp 97.5°F | Ht 65.5 in | Wt 290.0 lb

## 2024-05-19 DIAGNOSIS — Z6841 Body Mass Index (BMI) 40.0 and over, adult: Secondary | ICD-10-CM | POA: Diagnosis not present

## 2024-05-19 DIAGNOSIS — G43809 Other migraine, not intractable, without status migrainosus: Secondary | ICD-10-CM

## 2024-05-19 MED ORDER — VERAPAMIL HCL ER 100 MG PO CP24: 100.0000 mg | ORAL_CAPSULE | Freq: Every day | ORAL | 1 refills | Status: DC

## 2024-05-19 MED ORDER — WEGOVY 1 MG/0.5ML ~~LOC~~ SOAJ: 1.0000 mg | SUBCUTANEOUS | 0 refills | Status: AC

## 2024-05-19 NOTE — Progress Notes (Signed)
 SUBJECTIVE:  Chief Complaint: Obesity  Interim History: Patient had her hysterectomy 4 weeks early and she is sore today.  She was back to work within two weeks.  Overall she is happy with the decision to get her hysterectomy and is in less pain. She is feeling bloated overall and is feeling frustrated with still feeling bloated. She hasn't been eating much and hasn't been nutritious food.  She did restart her wegovy  but didn't take it this week due to constipation. She is occasionally taking toradol .    Tara Singleton is here to discuss her progress with her obesity treatment plan. She is on the keeping a food journal and adhering to recommended goals of 1500-1700 calories and 100 grams of protein and states she is following her eating plan approximately 25 % of the time. She states she is not exercising.  OBJECTIVE: Visit Diagnoses: Problem List Items Addressed This Visit       Other   Morbid obesity (HCC)   Relevant Medications   semaglutide -weight management (WEGOVY ) 1 MG/0.5ML SOAJ SQ injection   Other Visit Diagnoses       Other migraine without status migrainosus, not intractable    -  Primary   Relevant Medications   verapamil  (VERELAN ) 100 MG 24 hr capsule       Vitals Temp: (!) 97.5 F (36.4 C) BP: 102/70 Pulse Rate: 85 SpO2: 97 %   Anthropometric Measurements Height: 5' 5.5 (1.664 m) Weight: 290 lb (131.5 kg) BMI (Calculated): 47.51 Weight at Last Visit: 295 lb Weight Lost Since Last Visit: 5 Weight Gained Since Last Visit: 0 Starting Weight: 300 lb Total Weight Loss (lbs): 10 lb (4.536 kg)   Body Composition  Body Fat %: 55.8 % Fat Mass (lbs): 162 lbs Muscle Mass (lbs): 121.8 lbs Visceral Fat Rating : 18   Other Clinical Data Fasting: no Labs: no Today's Visit #: 5 Starting Date: 09/01/23 Comments: 1500-1700/100     ASSESSMENT AND PLAN: Assessment & Plan Other migraine without status migrainosus, not intractable Patient is currently on  verapamil  120 mg daily for management of her migraines.  She reports that this is good at controlling her symptoms.  However patient's blood pressure has been lower end of normal and she is currently dealing with a significant amount of discomfort postsurgery.  Will decrease verapamil  to 100 mg daily and follow-up at next appointment at the latest as to whether or not dose change has made an impact in her migraine control.  Patient encouraged to reach out via MyChart if this dose change causes migraines to become uncontrolled. Morbid obesity (HCC)  BMI 45.0-49.9, adult (HCC) Current BMI 49.3    Diet: [name] is currently in the action stage of change. As such, her goal is to continue with weight loss efforts and has agreed to keeping a food journal and adhering to recommended goals of 1500-1700 calories and 100 or more grams of protein daily.   Exercise:  All adults should avoid inactivity. Some activity is better than none, and adults who participate in any amount of physical activity, gain some health benefits.  Behavior Modification:  We discussed the following Behavioral Modification Strategies today: increasing lean protein intake, decreasing simple carbohydrates, increasing vegetables, and meal planning and cooking strategies. We discussed various medication options to help [name] with her weight loss efforts and we both agreed to continue Wegovy  at current dose..  No follow-ups on file.   She was informed of the importance of frequent follow up visits to  maximize her success with intensive lifestyle modifications for her multiple health conditions.  Attestation Statements:   Reviewed by clinician on day of visit: allergies, medications, problem list, medical history, surgical history, family history, social history, and previous encounter notes.     Adelita Cho, MD

## 2024-05-25 NOTE — Telephone Encounter (Signed)
 Medical Denial

## 2024-05-28 DIAGNOSIS — F330 Major depressive disorder, recurrent, mild: Secondary | ICD-10-CM | POA: Diagnosis not present

## 2024-05-31 ENCOUNTER — Other Ambulatory Visit (HOSPITAL_COMMUNITY)

## 2024-06-07 ENCOUNTER — Encounter: Payer: Self-pay | Admitting: Family Medicine

## 2024-06-10 ENCOUNTER — Encounter: Admitting: Obstetrics & Gynecology

## 2024-06-11 ENCOUNTER — Encounter: Admitting: Obstetrics & Gynecology

## 2024-06-16 ENCOUNTER — Ambulatory Visit (INDEPENDENT_AMBULATORY_CARE_PROVIDER_SITE_OTHER): Admitting: Obstetrics & Gynecology

## 2024-06-16 ENCOUNTER — Encounter: Payer: Self-pay | Admitting: Obstetrics & Gynecology

## 2024-06-16 VITALS — BP 134/79 | HR 86 | Ht 65.5 in | Wt 306.0 lb

## 2024-06-16 DIAGNOSIS — Z9889 Other specified postprocedural states: Secondary | ICD-10-CM

## 2024-06-16 NOTE — Progress Notes (Signed)
 HPI: Patient returns for routine postoperative follow-up having undergone RA TLH + BS on 04/28/2024.  The patient's immediate postoperative recovery has been unremarkable. Since hospital discharge the patient reports no problems.   Current Outpatient Medications: ABILIFY 5 MG tablet, , Disp: , Rfl:  cetirizine (ZYRTEC) 10 MG tablet, Take by mouth., Disp: , Rfl:  EPINEPHrine  0.3 mg/0.3 mL IJ SOAJ injection, Inject 0.3 mg into the muscle as needed for anaphylaxis., Disp: 1 each, Rfl: 0 FLUoxetine (PROZAC) 40 MG capsule, Take 40 mg by mouth daily., Disp: , Rfl:   lamoTRIgine (LAMICTAL) 100 MG tablet, Take by mouth daily., Disp: , Rfl:  mometasone (NASONEX) 50 MCG/ACT nasal spray, Place 2 sprays into the nose daily., Disp: , Rfl:  norethindrone  (MICRONOR ) 0.35 MG tablet, Take 1 tablet (0.35 mg total) by mouth daily., Disp: 28 tablet, Rfl: 11 ondansetron  (ZOFRAN -ODT) 8 MG disintegrating tablet, Take 1 tablet (8 mg total) by mouth every 8 (eight) hours as needed for nausea or vomiting., Disp: 8 tablet, Rfl: 0 PROAIR  HFA 108 (90 Base) MCG/ACT inhaler, Inhale into the lungs., Disp: , Rfl:  rizatriptan  (MAXALT ) 10 MG tablet, Take 1 tablet (10 mg total) by mouth as needed for migraine., Disp: 10 tablet, Rfl: 0 rosuvastatin  (CRESTOR ) 10 MG tablet, Take 1 tablet (10 mg total) by mouth daily., Disp: 90 tablet, Rfl: 1 verapamil  (VERELAN ) 100 MG 24 hr capsule, Take 1 capsule (100 mg total) by mouth at bedtime., Disp: 34 capsule, Rfl: 1 Vitamin D , Ergocalciferol , (DRISDOL ) 1.25 MG (50000 UNIT) CAPS capsule, Take 1 capsule (50,000 Units total) by mouth every 7 (seven) days., Disp: 12 capsule, Rfl: 0 ciprofloxacin  (CIPRO ) 500 MG tablet, Take 1 tablet (500 mg total) by mouth 2 (two) times daily. (Patient not taking: Reported on 06/16/2024), Disp: 14 tablet, Rfl: 0 ketorolac  (TORADOL ) 10 MG tablet, Take 1 tablet (10 mg total) by mouth every 6 (six) hours as needed. (Patient not taking: Reported on 06/16/2024), Disp:  20 tablet, Rfl: 0 ketorolac  (TORADOL ) 10 MG tablet, Take 1 tablet (10 mg total) by mouth every 8 (eight) hours as needed. (Patient not taking: Reported on 06/16/2024), Disp: 15 tablet, Rfl: 0 ondansetron  (ZOFRAN -ODT) 4 MG disintegrating tablet, Take 1 tablet (4 mg total) by mouth every 12 (twelve) hours. (Patient not taking: Reported on 06/16/2024), Disp: 20 tablet, Rfl: 1 oxyCODONE -acetaminophen  (PERCOCET) 7.5-325 MG tablet, Take 1 tablet by mouth every 6 (six) hours as needed. (Patient not taking: Reported on 06/16/2024), Disp: 28 tablet, Rfl: 0 promethazine -dextromethorphan (PROMETHAZINE -DM) 6.25-15 MG/5ML syrup, Take 5 mLs by mouth 4 (four) times daily as needed for cough. (Patient not taking: Reported on 06/16/2024), Disp: 118 mL, Rfl: 0 semaglutide -weight management (WEGOVY ) 1 MG/0.5ML SOAJ SQ injection, Inject 1 mg into the skin once a week. (Patient not taking: Reported on 06/16/2024), Disp: 2 mL, Rfl: 0  No current facility-administered medications for this visit.    Blood pressure 134/79, pulse 86, height 5' 5.5 (1.664 m), weight (!) 306 lb (138.8 kg), last menstrual period 03/25/2024.  Physical Exam: 4 incisions all look good Cuff healing well suture material is present No bulging or unusual tenderness  Diagnostic Tests:   Pathology: benign  Impression + Management plan: (S01.109) Post-operative state: RA TLH + BS  (primary encounter diagnosis)  No sex til Thanksgiving    Medications Prescribed this encounter: No orders of the defined types were placed in this encounter.     Follow up: prn   Vonn VEAR Inch, MD Attending Physician for the Center for [facility] and  [facility] 06/16/2024 11:12 AM

## 2024-06-25 DIAGNOSIS — F330 Major depressive disorder, recurrent, mild: Secondary | ICD-10-CM | POA: Diagnosis not present

## 2024-06-29 ENCOUNTER — Ambulatory Visit (INDEPENDENT_AMBULATORY_CARE_PROVIDER_SITE_OTHER): Admitting: Family Medicine

## 2024-07-09 DIAGNOSIS — F4323 Adjustment disorder with mixed anxiety and depressed mood: Secondary | ICD-10-CM | POA: Diagnosis not present

## 2024-07-11 NOTE — Progress Notes (Unsigned)
 [facility]  PATIENT: Tara Singleton DOB: 09-Jun-1986  REFERRING DOCTOR OR PCP: Boby Salvage, NP SOURCE: Patient, notes from PCP, notes from [facility], imaging and lab reports  _________________________________   HISTORICAL  CHIEF COMPLAINT:  Chief Complaint  Patient presents with   New Patient (Initial Visit)    Pt in room 10. Alone. 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    HISTORY OF PRESENT ILLNESS:  I had the pleasure of seeing your patient, Tara Singleton, at [facility] for neurologic consultation regarding her migraine headaches and abnormal pituitary gland with possible adenoma  She is a 38 yo woman who had the sudden onset of ataxia and slurred speech that lasted about 24 hours.  She had several more similar episodes and was diagnosed as having complicated migraines.   These were treated with verapamil  and she has not had recent episodes.   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    She notes that she has trouble tracking itms playing video games.  This causes a visual aura, nausea and migraine.   She has had onl a couple the last month but was having 2-3 a week.   She is currently on verapamil  100 mg po daily.  Rizatriptan  helps when a migraine occurs.   Zofran  helps the nausea  She denies visual field changes in general but has some peripheral changes with her migraines.   No galactorrhea.   She had a hysterectomy.   Periods had been irregular   Chronic Medications tried: Nortriptyline (helped but stopped after needing to go on an SSRI), amitriptyline, topiramate (had a kidney stone), propranolol  She is on Abilify and lamotrigine and fluoxetine for depression and anxiety.  This cmbinaiton has been helpful.      IMAGING (report) Brain MRI with and without contrast on September 07, 2018 there is a 5 mm T2 hyperintense lesion of the right aspect  of the pituitary Gland. 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   REVIEW OF SYSTEMS: Constitutional: No fevers, chills, sweats, or change in appetite Eyes: No visual changes, double vision, eye pain Ear, nose and throat: No hearing loss, ear pain, nasal congestion, sore throat Cardiovascular: No chest pain, palpitations Respiratory:  No shortness of breath at rest or with exertion.   No wheezes GastrointestinaI: No nausea, vomiting, diarrhea, abdominal pain, fecal incontinence Genitourinary:  No dysuria, urinary retention or frequency.  No nocturia. Musculoskeletal:  No neck pain, back pain Integumentary: No rash, pruritus, skin lesions Neurological: as above Psychiatric: No depression at this time.  No anxiety Endocrine: No palpitations, diaphoresis, change in appetite, change in weigh or increased thirst Hematologic/Lymphatic:  No anemia, purpura, petechiae. Allergic/Immunologic: No itchy/runny eyes, nasal congestion, recent allergic reactions, rashes  ALLERGIES: Allergies  Allergen Reactions   Avocado Hives, Shortness Of Breath, Nausea And Vomiting, Swelling and Rash   Betula [place]ba Oil Anaphylaxis   Kiwi Extract Hives, Shortness Of Breath, Dermatitis and Rash   Shellfish Allergy Anaphylaxis   Sulfa Antibiotics Hives, Other (See Comments) and Rash    Upset stomach    White Birch Anaphylaxis    Birch trees   Latex Hives   Sulfur Other (See Comments)    Welts   Avocado Extract Allergy Skin Test Hives   Macrobid [Nitrofurantoin] Hives   Mango Flavoring Agent (Non-Screening) Hives    HOME MEDICATIONS:  Current Outpatient Medications:    ABILIFY 5 MG tablet, ,  Disp: , Rfl:    cetirizine (ZYRTEC) 10 MG tablet, Take by mouth., Disp: , Rfl:    EPINEPHrine  0.3 mg/0.3 mL IJ SOAJ injection, Inject 0.3 mg into the muscle as needed for anaphylaxis., Disp: 1 each, Rfl: 0   FLUoxetine (PROZAC) 40 MG capsule, Take 40 mg by mouth daily., Disp: , Rfl:     lamoTRIgine (LAMICTAL) 100 MG tablet, Take by mouth daily., Disp: , Rfl:    mometasone (NASONEX) 50 MCG/ACT nasal spray, Place 2 sprays into the nose daily., Disp: , Rfl:    norethindrone  (MICRONOR ) 0.35 MG tablet, Take 1 tablet (0.35 mg total) by mouth daily., Disp: 28 tablet, Rfl: 11   ondansetron  (ZOFRAN -ODT) 4 MG disintegrating tablet, Take 1 tablet (4 mg total) by mouth every 12 (twelve) hours., Disp: 20 tablet, Rfl: 1   ondansetron  (ZOFRAN -ODT) 8 MG disintegrating tablet, Take 1 tablet (8 mg total) by mouth every 8 (eight) hours as needed for nausea or vomiting., Disp: 8 tablet, Rfl: 0   PROAIR  HFA 108 (90 Base) MCG/ACT inhaler, Inhale into the lungs., Disp: , Rfl:    rizatriptan  (MAXALT ) 10 MG tablet, Take 1 tablet (10 mg total) by mouth as needed for migraine., Disp: 10 tablet, Rfl: 0   rosuvastatin  (CRESTOR ) 10 MG tablet, Take 1 tablet (10 mg total) by mouth daily., Disp: 90 tablet, Rfl: 1   semaglutide -weight management (WEGOVY ) 1 MG/0.5ML SOAJ SQ injection, Inject 1 mg into the skin once a week., Disp: 2 mL, Rfl: 0   verapamil  (VERELAN ) 100 MG 24 hr capsule, Take 1 capsule (100 mg total) by mouth at bedtime., Disp: 34 capsule, Rfl: 1   Vitamin D , Ergocalciferol , (DRISDOL ) 1.25 MG (50000 UNIT) CAPS capsule, Take 1 capsule (50,000 Units total) by mouth every 7 (seven) days., Disp: 12 capsule, Rfl: 0   ciprofloxacin  (CIPRO ) 500 MG tablet, Take 1 tablet (500 mg total) by mouth 2 (two) times daily. (Patient not taking: Reported on 07/13/2024), Disp: 14 tablet, Rfl: 0   ketorolac  (TORADOL ) 10 MG tablet, Take 1 tablet (10 mg total) by mouth every 6 (six) hours as needed. (Patient not taking: Reported on 07/13/2024), Disp: 20 tablet, Rfl: 0   ketorolac  (TORADOL ) 10 MG tablet, Take 1 tablet (10 mg total) by mouth every 8 (eight) hours as needed. (Patient not taking: Reported on 07/13/2024), Disp: 15 tablet, Rfl: 0   oxyCODONE -acetaminophen  (PERCOCET) 7.5-325 MG tablet, Take 1 tablet by mouth every 6  (six) hours as needed. (Patient not taking: Reported on 07/13/2024), Disp: 28 tablet, Rfl: 0   promethazine -dextromethorphan (PROMETHAZINE -DM) 6.25-15 MG/5ML syrup, Take 5 mLs by mouth 4 (four) times daily as needed for cough. (Patient not taking: Reported on 07/13/2024), Disp: 118 mL, Rfl: 0  PAST MEDICAL HISTORY: Past Medical History:  Diagnosis Date   Allergy    Age 57   Anxiety    Arthritis    Age 48 via MRI knee   Asthma    B12 deficiency    Constipation    Depression    Endometriosis    GERD (gastroesophageal reflux disease)    Headache    Infertility, female    PCOS (polycystic ovarian syndrome)    Sleep apnea    Swelling of lower extremity     PAST SURGICAL HISTORY: Past Surgical History:  Procedure Laterality Date   EXPLORATORY LAPAROTOMY     endometriosis   EXTRACORPOREAL SHOCK WAVE LITHOTRIPSY Left 01/28/2022   Procedure: EXTRACORPOREAL SHOCK WAVE LITHOTRIPSY (ESWL);  Surgeon: Renda Glance, MD;  Location: [facility]  ;  Service: Urology;  Laterality: Left;   LAPAROSCOPIC OVARIAN CYSTECTOMY     ovarian cyst   ROBOTIC ASSISTED TOTAL HYSTERECTOMY WITH BILATERAL SALPINGO OOPHERECTOMY N/A 04/28/2024   Procedure: HYSTERECTOMY, TOTAL, ROBOT-ASSISTED, LAPAROSCOPIC, WITH BILATERAL SALPINGECTOMY;  Surgeon: Jayne Vonn DEL, MD;  Location: [facility];  Service: Gynecology;  Laterality: N/A;    FAMILY HISTORY: Family History  Problem Relation Age of Onset   Obesity Paternal Grandfather    COPD Paternal Grandmother    Obesity Paternal Grandmother    Cancer Maternal Grandmother    Obesity Maternal Grandmother    Asthma Father    Anxiety disorder Father    Sleep apnea Father    Alcoholism Father    Drug abuse Father    Obesity Father    Alcohol abuse Father    High blood pressure Mother    High Cholesterol Mother    Stroke Mother    Thyroid disease Mother    Cancer Mother    Depression Mother    Anxiety disorder Mother    Obesity Mother    Hypertension  Mother    Uterine cancer Mother     SOCIAL HISTORY: Social History   Socioeconomic History   Marital status: Married    Spouse name: Not on file   Number of children: Not on file   Years of education: Not on file   Highest education level: Some college, no degree  Occupational History   Not on file  Tobacco Use   Smoking status: Former    Types: Cigarettes   Smokeless tobacco: Never  Vaping Use   Vaping status: Every Day  Substance and Sexual Activity   Alcohol use: Yes    Comment: occ   Drug use: Never   Sexual activity: Not Currently    Birth control/protection: Surgical    Comment: hyst  Other Topics Concern   Not on file  Social History Narrative   Not on file   Social Drivers of Health   Financial Resource Strain: Medium Risk (04/06/2024)   Overall Financial Resource Strain (CARDIA)    Difficulty of Paying Living Expenses: Somewhat hard  Food Insecurity: No Food Insecurity (04/06/2024)   Hunger Vital Sign    Worried About Running Out of Food in the Last Year: Never true    Ran Out of Food in the Last Year: Never true  Transportation Needs: No Transportation Needs (04/06/2024)   PRAPARE - Administrator, Civil Service (Medical): No    Lack of Transportation (Non-Medical): No  Physical Activity: Insufficiently Active (04/06/2024)   Exercise Vital Sign    Days of Exercise per Week: 5 days    Minutes of Exercise per Session: 20 min  Stress: Stress Concern Present (04/06/2024)   [employer] of Occupational Health - Occupational Stress Questionnaire    Feeling of Stress: To some extent  Social Connections: Moderately Isolated (04/06/2024)   Social Connection and Isolation Panel    Frequency of Communication with Friends and Family: More than three times a week    Frequency of Social Gatherings with Friends and Family: Once a week    Attends Religious Services: Never    Database Administrator or Organizations: No    Attends Banker  Meetings: Never    Marital Status: Married  Catering Manager Violence: Not At Risk (04/06/2024)   Humiliation, Afraid, Rape, and Kick questionnaire    Fear of Current or Ex-Partner: No    Emotionally Abused: No  Physically Abused: No    Sexually Abused: No       PHYSICAL EXAM  Vitals:   07/13/24 1542  BP: 112/78  Pulse: 74  SpO2: 98%  Weight: 296 lb 8 oz (134.5 kg)  Height: 5' 5 (1.651 m)    Body mass index is 49.34 kg/m.   General: The patient is well-developed and well-nourished and in no acute distress  HEENT:  Head is [place]/AT.  Sclera are anicteric.  Funduscopic exam shows normal optic discs and retinal vessels.  Neck: No carotid bruits are noted.  The neck is nontender.  Cardiovascular: The heart has a regular rate and rhythm with a normal S1 and S2. There were no murmurs, gallops or rubs.    Skin: Extremities are without rash or  edema.  Musculoskeletal:  Back is nontender  Neurologic Exam  Mental status: The patient is alert and oriented x 3 at the time of the examination. The patient has apparent normal recent and remote memory, with an apparently normal attention span and concentration ability.   Speech is normal.  Cranial nerves: Extraocular movements are full. Pupils are equal, round, and reactive to light and accomodation.  Visual fields are full.  Facial symmetry is present. There is good facial sensation to soft touch bilaterally.Facial strength is normal.  Trapezius and sternocleidomastoid strength is normal. No dysarthria is noted.  The tongue is midline, and the patient has symmetric elevation of the soft palate. No obvious hearing deficits are noted.  Motor:  Muscle bulk is normal.   Tone is normal. Strength is  5 / 5 in all 4 extremities.   Sensory: Sensory testing is intact to pinprick, soft touch and vibration sensation in all 4 extremities.  Coordination: Cerebellar testing reveals good finger-nose-finger and heel-to-shin bilaterally.  Gait  and station: Station is normal.   Gait is normal. Tandem gait is normal. Romberg is negative.   Reflexes: Deep tendon reflexes are symmetric and normal bilaterally.        DIAGNOSTIC DATA (LABS, IMAGING, TESTING) - I reviewed patient records, labs, notes, testing and imaging myself where available.  Lab Results  Component Value Date   WBC 5.9 04/27/2024   HGB 13.1 04/27/2024   HCT 40.7 04/27/2024   MCV 94.4 04/27/2024   PLT 297 04/27/2024      Component Value Date/Time   NA 143 04/27/2024 1543   NA 140 09/01/2023 1036   K 3.5 04/27/2024 1543   CL 111 04/27/2024 1543   CO2 19 (L) 04/27/2024 1543   GLUCOSE 84 04/27/2024 1543   BUN 11 04/27/2024 1543   BUN 12 09/01/2023 1036   CREATININE 0.90 04/27/2024 1543   CALCIUM  9.5 04/27/2024 1543   PROT 7.3 04/27/2024 1543   PROT 6.9 09/01/2023 1036   ALBUMIN 4.1 04/27/2024 1543   ALBUMIN 4.4 09/01/2023 1036   AST 17 04/27/2024 1543   ALT 25 04/27/2024 1543   ALKPHOS 62 04/27/2024 1543   BILITOT 0.6 04/27/2024 1543   BILITOT 0.2 09/01/2023 1036   GFRNONAA >60 04/27/2024 1543   Lab Results  Component Value Date   CHOL 140 04/16/2024   HDL 46.60 04/16/2024   LDLCALC 68 04/16/2024   TRIG 127.0 04/16/2024   CHOLHDL 3 04/16/2024   Lab Results  Component Value Date   HGBA1C 5.5 09/01/2023   Lab Results  Component Value Date   VITAMINB12 526 12/24/2023   Lab Results  Component Value Date   TSH 2.40 12/24/2023  ASSESSMENT AND PLAN  Migraine with aura and without status migrainosus, not intractable  Complicated migraine - Plan: MRI pituitary with and without contrast  Pituitary cyst - Plan: MRI pituitary with and without contrast  Depression with anxiety  Other migraine without status migrainosus, not intractable - Plan: verapamil  (VERELAN ) 100 MG 24 hr capsule, DISCONTINUED: verapamil  (VERELAN ) 100 MG 24 hr capsule   In summary, Ms. Pence is a 38 year old woman with classic migraine and an abnormal  MRI showing a cyst within the pituitary gland.  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  However, we need to further evaluate this and determine if it is an adenoma.  Therefore, we will check an MRI of the brain with thin cuts through the pituitary gland to further evaluate.  If the characteristics are more consistent with adenoma, we will also check hormone levels (and if prolactin is elevated, consider cabergoline).  Her current regimen for migraines is working well.  Therefore, she will continue verapamil  100 mg daily and take rizatriptan  for breakthrough.  She will return to see me in 1 year or as needed based on the results of the studies.  She is advised to call for new or worsening neurologic symptoms.  Thank you for asking me to see this patient.  Please let her know if I need a further assistance with her or other patients in the future.   Keirsten Matuska A. Vear, MD, [facility] 07/13/2024, 4:21 PM Certified in Neurology, Clinical Neurophysiology, Sleep Medicine and Neuroimaging  [facility] [address] [place], [place] [address] [phone]

## 2024-07-13 ENCOUNTER — Ambulatory Visit: Admitting: Neurology

## 2024-07-13 ENCOUNTER — Encounter: Payer: Self-pay | Admitting: Neurology

## 2024-07-13 VITALS — BP 112/78 | HR 74 | Ht 65.0 in | Wt 296.5 lb

## 2024-07-13 DIAGNOSIS — G43109 Migraine with aura, not intractable, without status migrainosus: Secondary | ICD-10-CM

## 2024-07-13 DIAGNOSIS — E236 Other disorders of pituitary gland: Secondary | ICD-10-CM | POA: Diagnosis not present

## 2024-07-13 DIAGNOSIS — G43809 Other migraine, not intractable, without status migrainosus: Secondary | ICD-10-CM

## 2024-07-13 DIAGNOSIS — F418 Other specified anxiety disorders: Secondary | ICD-10-CM

## 2024-07-13 DIAGNOSIS — F4323 Adjustment disorder with mixed anxiety and depressed mood: Secondary | ICD-10-CM | POA: Diagnosis not present

## 2024-07-13 MED ORDER — RIZATRIPTAN BENZOATE 10 MG PO TABS: 10.0000 mg | ORAL_TABLET | ORAL | 11 refills | Status: AC | PRN

## 2024-07-13 MED ORDER — VERAPAMIL HCL ER 100 MG PO CP24: 100.0000 mg | ORAL_CAPSULE | Freq: Every day | ORAL | 11 refills | Status: DC

## 2024-07-13 MED ORDER — RIZATRIPTAN BENZOATE 10 MG PO TABS: 10.0000 mg | ORAL_TABLET | ORAL | 0 refills | Status: DC | PRN

## 2024-07-13 MED ORDER — VERAPAMIL HCL ER 100 MG PO CP24: 100.0000 mg | ORAL_CAPSULE | Freq: Every day | ORAL | 1 refills | Status: DC

## 2024-07-19 ENCOUNTER — Ambulatory Visit: Admitting: Neurology

## 2024-07-27 DIAGNOSIS — F330 Major depressive disorder, recurrent, mild: Secondary | ICD-10-CM | POA: Diagnosis not present

## 2024-07-30 DIAGNOSIS — F4323 Adjustment disorder with mixed anxiety and depressed mood: Secondary | ICD-10-CM | POA: Diagnosis not present

## 2024-08-10 ENCOUNTER — Other Ambulatory Visit

## 2024-08-10 DIAGNOSIS — G43109 Migraine with aura, not intractable, without status migrainosus: Secondary | ICD-10-CM

## 2024-08-10 DIAGNOSIS — E236 Other disorders of pituitary gland: Secondary | ICD-10-CM

## 2024-08-10 MED ORDER — GADOBENATE DIMEGLUMINE 529 MG/ML IV SOLN
10.0000 mL | Freq: Once | INTRAVENOUS | Status: AC | PRN
  Administered 2024-08-10: 10 mL via INTRAVENOUS

## 2024-08-11 ENCOUNTER — Ambulatory Visit: Payer: Self-pay | Admitting: Neurology

## 2024-08-11 DIAGNOSIS — E237 Disorder of pituitary gland, unspecified: Secondary | ICD-10-CM

## 2024-08-11 DIAGNOSIS — F330 Major depressive disorder, recurrent, mild: Secondary | ICD-10-CM | POA: Diagnosis not present

## 2024-08-13 DIAGNOSIS — F4323 Adjustment disorder with mixed anxiety and depressed mood: Secondary | ICD-10-CM | POA: Diagnosis not present

## 2024-08-18 ENCOUNTER — Ambulatory Visit

## 2024-08-18 ENCOUNTER — Ambulatory Visit
Admission: EM | Admit: 2024-08-18 | Discharge: 2024-08-18 | Disposition: A | Discharge status: Home / Self Care | Attending: Nurse Practitioner | Admitting: Nurse Practitioner

## 2024-08-18 DIAGNOSIS — K0889 Other specified disorders of teeth and supporting structures: Secondary | ICD-10-CM | POA: Diagnosis not present

## 2024-08-18 MED ORDER — AMOXICILLIN-POT CLAVULANATE 875-125 MG PO TABS: 1.0000 | ORAL_TABLET | Freq: Two times a day (BID) | ORAL | 0 refills | Status: DC

## 2024-08-18 MED ORDER — IBUPROFEN 800 MG PO TABS: 800.0000 mg | ORAL_TABLET | Freq: Three times a day (TID) | ORAL | 0 refills | Status: AC

## 2024-08-18 NOTE — Discharge Instructions (Signed)
Take medication as prescribed. May take over-the-counter Tylenol extra strength 1 to 2 hours after ibuprofen for breakthrough pain. Warm salt water gargles 3-4 times daily until symptoms improve. Continue warm compresses to the affected area to help with pain and discomfort. I have provided a dental resource guide for you to follow-up with a dentist.  Recommend following up within the next 7 to 10 days.  Follow-up as needed.

## 2024-08-18 NOTE — ED Triage Notes (Signed)
 Pt reports right lower side dental pain,and swelling  started yesterday, has a broke tooth on that side last month. Pain is most present in the inside of the gum instead of the outside. Has been using Orajel but has found no relief.

## 2024-08-18 NOTE — ED Provider Notes (Signed)
 [facility]    CSN: [id number] Arrival date & time: 08/18/24  1153      History   Chief Complaint No chief complaint on file.   HPI Tara Singleton is a 38 y.o. female.   The history is provided by the patient.   Patient presents for complaints of dental pain has been present for the past 3 days.  Patient states that she has pain in the right lower portion of her mouth.  She states that she has a tooth that is chipped in the same area.  She states that she feels like there is a bowling ball on the inside of her mouth.  She endorses pain on the inside of the gum versus on the outside of the gum.  Denies fever, chills, chest pain, abdominal pain, nausea, vomiting, or diarrhea.  States that she currently does not have a dentist.  She has been taking over-the-counter medications for her symptoms.  Past Medical History:  Diagnosis Date   Allergy    Age 75   Anxiety    Arthritis    Age 50 via MRI knee   Asthma    B12 deficiency    Constipation    Depression    Endometriosis    GERD (gastroesophageal reflux disease)    Headache    Infertility, female    PCOS (polycystic ovarian syndrome)    Sleep apnea    Swelling of lower extremity     Patient Active Problem List   Diagnosis Date Noted   LLQ pain 04/21/2024   Vaginal bleeding 04/21/2024   IUD (intrauterine device) in place 04/21/2024   Deep endometriosis of bilateral uterosacral ligament(s) 04/13/2024   Dysmenorrhea 04/13/2024   Dyspareunia, female 04/13/2024   Family history of early CAD 02/27/2024   PID (pelvic inflammatory disease) 02/27/2024   Polyphagia 02/02/2024   Insulin  resistance 09/02/2023   Anxiety and depression 04/25/2023   Panic attacks 03/16/2023   Morbid obesity (HCC) 03/16/2023   Mild sleep apnea 03/16/2023   Anemia 03/16/2023   Obesity (BMI 30-39.9) 08/02/2022   Weight gain 08/02/2022   Vitamin D  deficiency 08/02/2022   Snores 08/02/2022   Daytime somnolence 08/02/2022    Hyperlipidemia 08/02/2022   Superior glenoid labrum lesion of right shoulder 08/02/2022   PCOS (polycystic ovarian syndrome) 07/26/2022   Pelvic floor dysfunction 07/26/2022   Pituitary adenoma (HCC) 07/26/2022   At risk for sleep apnea 04/19/2022   Migraine with aura and without status migrainosus, not intractable 04/13/2021   Endometriosis determined by laparoscopy 03/17/2021   PMDD (premenstrual dysphoric disorder) 03/17/2021   Essential hypertension 04/12/2020   Tachycardia 11/03/2018   Dizziness 08/19/2018   Paresthesia 08/19/2018   Shellfish allergy 06/11/2016   Dysfunction of both eustachian tubes 10/25/2015   Acute bronchitis 06/24/2015   Allergic reaction to food 06/24/2015   Moderate persistent asthma without complication 06/24/2015   Other seasonal allergic rhinitis 06/24/2015   Urticaria 06/24/2015   Musculoskeletal neck pain 11/10/2014   Referred otalgia 11/10/2014   Temporomandibular joint dysfunction 11/10/2014    Past Surgical History:  Procedure Laterality Date   EXPLORATORY LAPAROTOMY     endometriosis   EXTRACORPOREAL SHOCK WAVE LITHOTRIPSY Left 01/28/2022   Procedure: EXTRACORPOREAL SHOCK WAVE LITHOTRIPSY (ESWL);  Surgeon: Renda Glance, MD;  Location: [facility];  Service: Urology;  Laterality: Left;   LAPAROSCOPIC OVARIAN CYSTECTOMY     ovarian cyst   ROBOTIC ASSISTED TOTAL HYSTERECTOMY WITH BILATERAL SALPINGO OOPHERECTOMY N/A 04/28/2024   Procedure: HYSTERECTOMY, TOTAL,  ROBOT-ASSISTED, LAPAROSCOPIC, WITH BILATERAL SALPINGECTOMY;  Surgeon: Jayne Vonn DEL, MD;  Location: [facility];  Service: Gynecology;  Laterality: N/A;    OB History     Gravida  5   Para      Term      Preterm      AB  5   Living         SAB  5   IAB      Ectopic      Multiple      Live Births               Home Medications    Prior to Admission medications   Medication Sig Start Date End Date Taking? Authorizing Provider  ABILIFY 5 MG tablet   03/13/23   [provider]  cetirizine (ZYRTEC) 10 MG tablet Take by mouth.    [provider]  ciprofloxacin  (CIPRO ) 500 MG tablet Take 1 tablet (500 mg total) by mouth 2 (two) times daily. Patient not taking: Reported on 07/13/2024 04/28/24   Jayne Vonn DEL, MD  EPINEPHrine  0.3 mg/0.3 mL IJ SOAJ injection Inject 0.3 mg into the muscle as needed for anaphylaxis. 12/24/23   Henson, Vickie L, NP-C  FLUoxetine (PROZAC) 40 MG capsule Take 40 mg by mouth daily. 02/06/22   [provider]  ketorolac  (TORADOL ) 10 MG tablet Take 1 tablet (10 mg total) by mouth every 6 (six) hours as needed. Patient not taking: Reported on 07/13/2024 04/21/24   Signa Delon LABOR, NP  ketorolac  (TORADOL ) 10 MG tablet Take 1 tablet (10 mg total) by mouth every 8 (eight) hours as needed. Patient not taking: Reported on 07/13/2024 04/28/24   Jayne Vonn DEL, MD  lamoTRIgine (LAMICTAL) 100 MG tablet Take by mouth daily. 03/08/24   [provider]  mometasone (NASONEX) 50 MCG/ACT nasal spray Place 2 sprays into the nose daily.    [provider]  norethindrone  (MICRONOR ) 0.35 MG tablet Take 1 tablet (0.35 mg total) by mouth daily. 05/04/24   Jayne Vonn DEL, MD  ondansetron  (ZOFRAN -ODT) 4 MG disintegrating tablet Take 1 tablet (4 mg total) by mouth every 12 (twelve) hours. 03/26/24   Henson, Vickie L, NP-C  ondansetron  (ZOFRAN -ODT) 8 MG disintegrating tablet Take 1 tablet (8 mg total) by mouth every 8 (eight) hours as needed for nausea or vomiting. 04/28/24   Jayne Vonn DEL, MD  oxyCODONE -acetaminophen  (PERCOCET) 7.5-325 MG tablet Take 1 tablet by mouth every 6 (six) hours as needed. Patient not taking: Reported on 07/13/2024 04/28/24   Jayne Vonn DEL, MD  PROAIR  HFA 108 [phone] Base) MCG/ACT inhaler Inhale into the lungs. 07/26/21   [provider]  promethazine -dextromethorphan (PROMETHAZINE -DM) 6.25-15 MG/5ML syrup Take 5 mLs by mouth 4 (four) times daily as needed for cough. Patient not  taking: Reported on 07/13/2024 04/16/24   Lendia Boby CROME, NP-C  rizatriptan  (MAXALT ) 10 MG tablet Take 1 tablet (10 mg total) by mouth as needed for migraine. 07/13/24   Sater, Charlie LABOR, MD  rosuvastatin  (CRESTOR ) 10 MG tablet Take 1 tablet (10 mg total) by mouth daily. 02/27/24   Henson, Vickie L, NP-C  semaglutide -weight management (WEGOVY ) 1 MG/0.5ML SOAJ SQ injection Inject 1 mg into the skin once a week. 05/19/24   Berkeley Adelita PENNER, MD  verapamil  (VERELAN ) 100 MG 24 hr capsule Take 1 capsule (100 mg total) by mouth at bedtime. 07/13/24   Sater, Charlie LABOR, MD  Vitamin D , Ergocalciferol , (DRISDOL ) 1.25 MG (50000 UNIT) CAPS capsule Take  1 capsule (50,000 Units total) by mouth every 7 (seven) days. 03/25/24   Berkeley Adelita PENNER, MD    Family History Family History  Problem Relation Age of Onset   Obesity Paternal Grandfather    COPD Paternal Grandmother    Obesity Paternal Grandmother    Cancer Maternal Grandmother    Obesity Maternal Grandmother    Asthma Father    Anxiety disorder Father    Sleep apnea Father    Alcoholism Father    Drug abuse Father    Obesity Father    Alcohol abuse Father    High blood pressure Mother    High Cholesterol Mother    Stroke Mother    Thyroid disease Mother    Cancer Mother    Depression Mother    Anxiety disorder Mother    Obesity Mother    Hypertension Mother    Uterine cancer Mother     Social History Social History   Tobacco Use   Smoking status: Former    Types: Cigarettes   Smokeless tobacco: Never  Vaping Use   Vaping status: Every Day  Substance Use Topics   Alcohol use: Yes    Comment: occ   Drug use: Never     Allergies   Avocado, Betula alba oil, Kiwi extract, Shellfish allergy, Sulfa antibiotics, White birch, Latex, Sulfur, Avocado extract allergy skin test, Macrobid [nitrofurantoin], and Mango flavoring agent (non-screening)   Review of Systems Review of Systems Per HPI  Physical Exam Triage Vital Signs ED  Triage Vitals  Encounter Vitals Group     BP 08/18/24 1309 135/82     Girls Systolic BP Percentile --      Girls Diastolic BP Percentile --      Boys Systolic BP Percentile --      Boys Diastolic BP Percentile --      Pulse Rate 08/18/24 1309 63     Resp 08/18/24 1309 20     Temp 08/18/24 1309 98.6 F (37 C)     Temp Source 08/18/24 1309 Oral     SpO2 08/18/24 1309 98 %     Weight --      Height --      Head Circumference --      Peak Flow --      Pain Score 08/18/24 1308 8     Pain Loc --      Pain Education --      Exclude from Growth Chart --    No data found.  Updated Vital Signs BP 135/82 (BP Location: Right Arm)   Pulse 63   Temp 98.6 F (37 C) (Oral)   Resp 20   LMP 03/25/2024 (Exact Date)   SpO2 98%   Visual Acuity Right Eye Distance:   Left Eye Distance:   Bilateral Distance:    Right Eye Near:   Left Eye Near:    Bilateral Near:     Physical Exam Vitals and nursing note reviewed.  HENT:     Head: Normocephalic.     Right Ear: Tympanic membrane, ear canal and external ear normal.     Left Ear: Tympanic membrane, ear canal and external ear normal.     Nose: Nose normal.     Mouth/Throat:     Lips: Pink.     Mouth: Mucous membranes are moist.     Dentition: Dental tenderness present. No gingival swelling or dental abscesses.     Pharynx: Oropharynx is clear. Uvula midline.   Eyes:  Extraocular Movements: Extraocular movements intact.     Conjunctiva/sclera: Conjunctivae normal.     Pupils: Pupils are equal, round, and reactive to light.  Cardiovascular:     Rate and Rhythm: Normal rate and regular rhythm.     Pulses: Normal pulses.     Heart sounds: Normal heart sounds.  Pulmonary:     Effort: Pulmonary effort is normal. No respiratory distress.     Breath sounds: Normal breath sounds. No stridor. No wheezing, rhonchi or rales.  Musculoskeletal:     Cervical back: Normal range of motion.  Skin:    General: Skin is warm and dry.   Neurological:     General: No focal deficit present.     Mental Status: She is oriented to person, place, and time.  Psychiatric:        Mood and Affect: Mood normal.        Behavior: Behavior normal.      UC Treatments / Results  Labs (all labs ordered are listed, but only abnormal results are displayed) Labs Reviewed - No data to display  EKG   Radiology No results found.  Procedures Procedures (including critical care time)  Medications Ordered in UC Medications - No data to display  Initial Impression / Assessment and Plan / UC Course  I have reviewed the triage vital signs and the nursing notes.  Pertinent labs & imaging results that were available during my care of the patient were reviewed by me and considered in my medical decision making (see chart for details).  Patient presents for 3-day history of right lower dental pain.  On exam, lung sounds are clear throughout, room air sats are at 98%.  She does not have symptoms of a dental abscess at this time.  Will treat with Augmentin  875/125 mg tablets.  For pain, ibuprofen 800 mg was prescribed.  Supportive care recommendations were provided and discussed with the patient to include fluids, rest, warm salt water  gargles, and use of ice or heat as needed.  Patient advised to follow-up with a dentist within the next 7 to 10 days for reevaluation, dental resources were provided.  Patient was in agreement with this plan of care and verbalizes understanding.  All questions were answered.  Patient stable for discharge.   Final Clinical Impressions(s) / UC Diagnoses   Final diagnoses:  None   Discharge Instructions   None    ED Prescriptions   None    PDMP not reviewed this encounter.   Gilmer Etta PARAS, NP 08/18/24 1332

## 2024-08-20 DIAGNOSIS — F330 Major depressive disorder, recurrent, mild: Secondary | ICD-10-CM | POA: Diagnosis not present

## 2024-08-28 ENCOUNTER — Ambulatory Visit
Admission: RE | Admit: 2024-08-28 | Discharge: 2024-08-28 | Disposition: A | Discharge status: Home / Self Care | Payer: Self-pay | Source: Ambulatory Visit | Attending: Student | Admitting: Student

## 2024-08-28 VITALS — BP 133/78 | HR 81 | Temp 97.6°F | Resp 18

## 2024-08-28 DIAGNOSIS — H60391 Other infective otitis externa, right ear: Secondary | ICD-10-CM

## 2024-08-28 DIAGNOSIS — B3731 Acute candidiasis of vulva and vagina: Secondary | ICD-10-CM

## 2024-08-28 MED ORDER — FLUCONAZOLE 150 MG PO TABS: 150.0000 mg | ORAL_TABLET | Freq: Every day | ORAL | 0 refills | Status: AC

## 2024-08-28 MED ORDER — KETOROLAC TROMETHAMINE 10 MG PO TABS: 10.0000 mg | ORAL_TABLET | Freq: Four times a day (QID) | ORAL | 0 refills | Status: AC | PRN

## 2024-08-28 MED ORDER — OFLOXACIN 0.3 % OT SOLN: 5.0000 [drp] | Freq: Two times a day (BID) | OTIC | 0 refills | Status: AC

## 2024-08-28 NOTE — ED Provider Notes (Signed)
 " [facility]    CSN: [id number] Arrival date & time: 08/28/24  1232      History   Chief Complaint Chief Complaint  Patient presents with   Ear Fullness    Entered by patient    HPI Tara Singleton is a 38 y.o. female presenting w R ear pain.   States right ear is painful and feels like fluid in ear since last night. States hearing is muffled. States she had been fighting a yeast infection from recent antibiotic (08/18/24 - augmentin  for dental issue) and is requesting diflucan .  Minimal improvement in dental pain - DDS appt 09/06/24     HPI  Past Medical History:  Diagnosis Date   Allergy    Age 50   Anxiety    Arthritis    Age 81 via MRI knee   Asthma    B12 deficiency    Constipation    Depression    Endometriosis    GERD (gastroesophageal reflux disease)    Headache    Infertility, female    PCOS (polycystic ovarian syndrome)    Sleep apnea    Swelling of lower extremity     Patient Active Problem List   Diagnosis Date Noted   LLQ pain 04/21/2024   Vaginal bleeding 04/21/2024   IUD (intrauterine device) in place 04/21/2024   Deep endometriosis of bilateral uterosacral ligament(s) 04/13/2024   Dysmenorrhea 04/13/2024   Dyspareunia, female 04/13/2024   Family history of early CAD 02/27/2024   PID (pelvic inflammatory disease) 02/27/2024   Polyphagia 02/02/2024   Insulin  resistance 09/02/2023   Anxiety and depression 04/25/2023   Panic attacks 03/16/2023   Morbid obesity (HCC) 03/16/2023   Mild sleep apnea 03/16/2023   Anemia 03/16/2023   Obesity (BMI 30-39.9) 08/02/2022   Weight gain 08/02/2022   Vitamin D  deficiency 08/02/2022   Snores 08/02/2022   Daytime somnolence 08/02/2022   Hyperlipidemia 08/02/2022   Superior glenoid labrum lesion of right shoulder 08/02/2022   PCOS (polycystic ovarian syndrome) 07/26/2022   Pelvic floor dysfunction 07/26/2022   Pituitary adenoma (HCC) 07/26/2022   At risk for sleep apnea 04/19/2022    Migraine with aura and without status migrainosus, not intractable 04/13/2021   Endometriosis determined by laparoscopy 03/17/2021   PMDD (premenstrual dysphoric disorder) 03/17/2021   Essential hypertension 04/12/2020   Tachycardia 11/03/2018   Dizziness 08/19/2018   Paresthesia 08/19/2018   Shellfish allergy 06/11/2016   Dysfunction of both eustachian tubes 10/25/2015   Acute bronchitis 06/24/2015   Allergic reaction to food 06/24/2015   Moderate persistent asthma without complication 06/24/2015   Other seasonal allergic rhinitis 06/24/2015   Urticaria 06/24/2015   Musculoskeletal neck pain 11/10/2014   Referred otalgia 11/10/2014   Temporomandibular joint dysfunction 11/10/2014    Past Surgical History:  Procedure Laterality Date   EXPLORATORY LAPAROTOMY     endometriosis   EXTRACORPOREAL SHOCK WAVE LITHOTRIPSY Left 01/28/2022   Procedure: EXTRACORPOREAL SHOCK WAVE LITHOTRIPSY (ESWL);  Surgeon: Renda Glance, MD;  Location: [facility];  Service: Urology;  Laterality: Left;   LAPAROSCOPIC OVARIAN CYSTECTOMY     ovarian cyst   ROBOTIC ASSISTED TOTAL HYSTERECTOMY WITH BILATERAL SALPINGO OOPHERECTOMY N/A 04/28/2024   Procedure: HYSTERECTOMY, TOTAL, ROBOT-ASSISTED, LAPAROSCOPIC, WITH BILATERAL SALPINGECTOMY;  Surgeon: Jayne Vonn DEL, MD;  Location: [facility];  Service: Gynecology;  Laterality: N/A;    OB History     Gravida  5   Para      Term      Preterm  AB  5   Living         SAB  5   IAB      Ectopic      Multiple      Live Births               Home Medications    Prior to Admission medications  Medication Sig Start Date End Date Taking? Authorizing Provider  fluconazole  (DIFLUCAN ) 150 MG tablet Take 1 tablet (150 mg total) by mouth daily. -For your yeast infection, start the Diflucan  (fluconazole )- Take one pill today (day 1). If you're still having symptoms in 3 days, take the second pill. 08/28/24  Yes Carlos Quackenbush E, PA-C   ketorolac  (TORADOL ) 10 MG tablet Take 1 tablet (10 mg total) by mouth every 6 (six) hours as needed. 08/28/24  Yes Amanii Snethen E, PA-C  ofloxacin  (FLOXIN ) 0.3 % OTIC solution Place 5 drops into the right ear 2 (two) times daily for 7 days. 08/28/24 09/04/24 Yes Marleny Faller E, PA-C  ABILIFY 5 MG tablet  03/13/23   [provider]  cetirizine (ZYRTEC) 10 MG tablet Take by mouth.    [provider]  EPINEPHrine  0.3 mg/0.3 mL IJ SOAJ injection Inject 0.3 mg into the muscle as needed for anaphylaxis. 12/24/23   Henson, Vickie L, NP-C  FLUoxetine (PROZAC) 40 MG capsule Take 40 mg by mouth daily. 02/06/22   [provider]  ibuprofen  (ADVIL ) 800 MG tablet Take 1 tablet (800 mg total) by mouth 3 (three) times daily. 08/18/24   Leath-Warren, Etta PARAS, NP  lamoTRIgine (LAMICTAL) 100 MG tablet Take by mouth daily. 03/08/24   [provider]  mometasone (NASONEX) 50 MCG/ACT nasal spray Place 2 sprays into the nose daily.    [provider]  norethindrone  (MICRONOR ) 0.35 MG tablet Take 1 tablet (0.35 mg total) by mouth daily. 05/04/24   Jayne Vonn DEL, MD  ondansetron  (ZOFRAN -ODT) 4 MG disintegrating tablet Take 1 tablet (4 mg total) by mouth every 12 (twelve) hours. 03/26/24   Henson, Vickie L, NP-C  ondansetron  (ZOFRAN -ODT) 8 MG disintegrating tablet Take 1 tablet (8 mg total) by mouth every 8 (eight) hours as needed for nausea or vomiting. 04/28/24   Jayne Vonn DEL, MD  PROAIR  HFA 108 (90 Base) MCG/ACT inhaler Inhale into the lungs. 07/26/21   [provider]  rizatriptan  (MAXALT ) 10 MG tablet Take 1 tablet (10 mg total) by mouth as needed for migraine. 07/13/24   Sater, Charlie LABOR, MD  rosuvastatin  (CRESTOR ) 10 MG tablet Take 1 tablet (10 mg total) by mouth daily. 02/27/24   Henson, Vickie L, NP-C  semaglutide -weight management (WEGOVY ) 1 MG/0.5ML SOAJ SQ injection Inject 1 mg into the skin once a week. 05/19/24   Berkeley Adelita PENNER, MD  verapamil  (VERELAN )  100 MG 24 hr capsule Take 1 capsule (100 mg total) by mouth at bedtime. 07/13/24   Sater, Charlie LABOR, MD  Vitamin D , Ergocalciferol , (DRISDOL ) 1.25 MG (50000 UNIT) CAPS capsule Take 1 capsule (50,000 Units total) by mouth every 7 (seven) days. 03/25/24   Berkeley Adelita PENNER, MD    Family History Family History  Problem Relation Age of Onset   Obesity Paternal Grandfather    COPD Paternal Grandmother    Obesity Paternal Grandmother    Cancer Maternal Grandmother    Obesity Maternal Grandmother    Asthma Father    Anxiety disorder Father    Sleep apnea Father    Alcoholism Father  Drug abuse Father    Obesity Father    Alcohol abuse Father    High blood pressure Mother    High Cholesterol Mother    Stroke Mother    Thyroid disease Mother    Cancer Mother    Depression Mother    Anxiety disorder Mother    Obesity Mother    Hypertension Mother    Uterine cancer Mother     Social History Social History[1]   Allergies   Avocado, Betula alba oil, Kiwi extract, Shellfish allergy, Sulfa antibiotics, White birch, Latex, Sulfur, Avocado extract allergy skin test, Macrobid [nitrofurantoin], and Mango flavoring agent (non-screening)   Review of Systems Review of Systems  HENT:  Positive for ear pain and hearing loss.      Physical Exam Triage Vital Signs ED Triage Vitals  Encounter Vitals Group     BP 08/28/24 1320 133/78     Girls Systolic BP Percentile --      Girls Diastolic BP Percentile --      Boys Systolic BP Percentile --      Boys Diastolic BP Percentile --      Pulse Rate 08/28/24 1320 81     Resp 08/28/24 1320 18     Temp 08/28/24 1320 97.6 F (36.4 C)     Temp Source 08/28/24 1320 Oral     SpO2 08/28/24 1320 93 %     Weight --      Height --      Head Circumference --      Peak Flow --      Pain Score 08/28/24 1321 8     Pain Loc --      Pain Education --      Exclude from Growth Chart --    No data found.  Updated Vital Signs BP 133/78 (BP  Location: Right Arm)   Pulse 81   Temp 97.6 F (36.4 C) (Oral)   Resp 18   LMP 03/25/2024   SpO2 93%   Visual Acuity Right Eye Distance:   Left Eye Distance:   Bilateral Distance:    Right Eye Near:   Left Eye Near:    Bilateral Near:     Physical Exam Vitals reviewed.  Constitutional:      General: She is not in acute distress.    Appearance: Normal appearance. She is not ill-appearing.  HENT:     Head: Normocephalic and atraumatic.     Right Ear: Tympanic membrane and external ear normal. Swelling and tenderness present. Tympanic membrane is not scarred, perforated, erythematous, retracted or bulging.     Left Ear: Hearing, tympanic membrane, ear canal and external ear normal.     Ears:     Comments: The right external auditory canal is swollen and tender, without exudate.  The tympanic membrane is pearly gray and intact. Pulmonary:     Effort: Pulmonary effort is normal.  Neurological:     General: No focal deficit present.     Mental Status: She is alert and oriented to person, place, and time.  Psychiatric:        Mood and Affect: Mood normal.        Behavior: Behavior normal.        Thought Content: Thought content normal.        Judgment: Judgment normal.      UC Treatments / Results  Labs (all labs ordered are listed, but only abnormal results are displayed) Labs Reviewed - No data to display  EKG   Radiology No results found.  Procedures Procedures (including critical care time)  Medications Ordered in UC Medications - No data to display  Initial Impression / Assessment and Plan / UC Course  I have reviewed the triage vital signs and the nursing notes.  Pertinent labs & imaging results that were available during my care of the patient were reviewed by me and considered in my medical decision making (see chart for details).     Patient is a pleasant 38 y.o. female presenting with R otitis externa. The patient is afebrile and nontachycardic.   Antipyretic has not been administered today. S/p hysterectomy  For dental and ear pain, offered toradol  IM, which she declines, in favor of toradol  PO.  She has a follow-up with her DDS on 09/06/24  Ofloxacin  sent.  Clean dry ear precautions.  Suspected vaginal candidiasis following Augmentin  that she was prescribed few weeks ago.  Diflucan  sent.  Final Clinical Impressions(s) / UC Diagnoses   Final diagnoses:  Other infective acute otitis externa of right ear  Vaginal candidiasis     Discharge Instructions      -For your external ear infection: ofloxacin  antibiotic drops twice daily x7 days -Keep the ear dry. Earplug in shower.  -Toradol  (Ketorolac ), one pill up to every 6 hours for pain. Make sure to take this with food. Avoid other NSAIDs like ibuprofen , alleve, naproxen  while taking this medication.  -You can also take Tylenol  1000mg  up to 3x daily  -For your yeast infection, start the Diflucan  (fluconazole )- Take one pill today (day 1). If you're still having symptoms in 3 days, take the second pill -Follow-up with dentist as scheduled 09/06/24 -Follow-up with us  if symptoms worsen or persist before you can see your dentist       ED Prescriptions     Medication Sig Dispense Auth. Provider   ofloxacin  (FLOXIN ) 0.3 % OTIC solution Place 5 drops into the right ear 2 (two) times daily for 7 days. 5 mL [name] Stroschein E, PA-C   fluconazole  (DIFLUCAN ) 150 MG tablet Take 1 tablet (150 mg total) by mouth daily. -For your yeast infection, start the Diflucan  (fluconazole )- Take one pill today (day 1). If you're still having symptoms in 3 days, take the second pill. 2 tablet Brandon Scarbrough E, PA-C   ketorolac  (TORADOL ) 10 MG tablet Take 1 tablet (10 mg total) by mouth every 6 (six) hours as needed. 20 tablet Kalea Perine E, PA-C      PDMP not reviewed this encounter.     [1]  Social History Tobacco Use   Smoking status: Former    Types: Cigarettes   Smokeless tobacco: Never   Vaping Use   Vaping status: Every Day  Substance Use Topics   Alcohol use: Yes    Comment: occ   Drug use: Never     Arlyss Leita BRAVO, PA-C 08/28/24 1428  "

## 2024-08-28 NOTE — Discharge Instructions (Addendum)
-  For your external ear infection: ofloxacin  antibiotic drops twice daily x7 days -Keep the ear dry. Earplug in shower.  -Toradol  (Ketorolac ), one pill up to every 6 hours for pain. Make sure to take this with food. Avoid other NSAIDs like ibuprofen , alleve, naproxen  while taking this medication.  -You can also take Tylenol  1000mg  up to 3x daily  -For your yeast infection, start the Diflucan  (fluconazole )- Take one pill today (day 1). If you're still having symptoms in 3 days, take the second pill -Follow-up with dentist as scheduled 09/06/24 -Follow-up with us  if symptoms worsen or persist before you can see your dentist

## 2024-08-28 NOTE — ED Triage Notes (Signed)
 States right ear is painful and feels like fluid in ear since last night.  States she had been fighting a yeast infection from recent antibiotic and is requesting diflucan .

## 2024-09-04 ENCOUNTER — Encounter (HOSPITAL_COMMUNITY): Payer: Self-pay | Admitting: Emergency Medicine

## 2024-09-04 ENCOUNTER — Other Ambulatory Visit: Payer: Self-pay

## 2024-09-04 ENCOUNTER — Emergency Department (HOSPITAL_COMMUNITY)
Admission: EM | Admit: 2024-09-04 | Discharge: 2024-09-04 | Disposition: A | Discharge status: Home / Self Care | Attending: Emergency Medicine | Admitting: Emergency Medicine

## 2024-09-04 DIAGNOSIS — I10 Essential (primary) hypertension: Secondary | ICD-10-CM | POA: Insufficient documentation

## 2024-09-04 DIAGNOSIS — J45909 Unspecified asthma, uncomplicated: Secondary | ICD-10-CM | POA: Insufficient documentation

## 2024-09-04 DIAGNOSIS — Z91040 Latex allergy status: Secondary | ICD-10-CM | POA: Insufficient documentation

## 2024-09-04 DIAGNOSIS — K0889 Other specified disorders of teeth and supporting structures: Secondary | ICD-10-CM | POA: Diagnosis not present

## 2024-09-04 DIAGNOSIS — Z87891 Personal history of nicotine dependence: Secondary | ICD-10-CM | POA: Diagnosis not present

## 2024-09-04 MED ORDER — ACETAMINOPHEN 500 MG PO TABS
1000.0000 mg | ORAL_TABLET | Freq: Once | ORAL | Status: AC
  Administered 2024-09-04: 1000 mg via ORAL
  Filled 2024-09-04: qty 2

## 2024-09-04 MED ORDER — OXYCODONE HCL 5 MG PO TABS: 5.0000 mg | ORAL_TABLET | Freq: Four times a day (QID) | ORAL | 0 refills | Status: AC | PRN

## 2024-09-04 MED ORDER — OXYCODONE HCL 5 MG PO TABS
5.0000 mg | ORAL_TABLET | Freq: Once | ORAL | Status: AC
  Administered 2024-09-04: 5 mg via ORAL
  Filled 2024-09-04: qty 1

## 2024-09-04 MED ORDER — AMOXICILLIN-POT CLAVULANATE 875-125 MG PO TABS: 1.0000 | ORAL_TABLET | Freq: Two times a day (BID) | ORAL | 0 refills | Status: AC

## 2024-09-04 MED ORDER — AMOXICILLIN-POT CLAVULANATE 875-125 MG PO TABS
1.0000 | ORAL_TABLET | Freq: Once | ORAL | Status: AC
  Administered 2024-09-04: 1 via ORAL
  Filled 2024-09-04: qty 1

## 2024-09-04 NOTE — ED Triage Notes (Signed)
 Pov c/o dental pain on right lower side. Pt has had a root canal on said area. Pt was seen at [facility] and was put on abx and finished abx. Pain is radiating into right ear and across jaw to left side.

## 2024-09-04 NOTE — ED Provider Notes (Signed)
 " [place] [facility] AT [facility] Provider Note  CSN: [id number] Arrival date & time: 09/04/24 1840  Chief Complaint(s) Dental Pain  HPI Tara Singleton is a 38 y.o. female without significant past medical history presenting to the emergency department with dental pain.  She reports pain to the right lower jaw.  Has had pain there previously.  Was recently put on Augmentin , she reports that this significantly improved the pain however Augmentin  ran out and now her pain has recurred.  She has follow-up with a dentist on Monday but reports the pain is bothering her.  She took some leftover home pain medicine and has been on Toradol  for 5 days but cannot take anymore Toradol  .  No fevers or chills, facial swelling, trouble swallowing.   Past Medical History Past Medical History:  Diagnosis Date   Allergy    Age 60   Anxiety    Arthritis    Age 69 via MRI knee   Asthma    B12 deficiency    Constipation    Depression    Endometriosis    GERD (gastroesophageal reflux disease)    Headache    Infertility, female    PCOS (polycystic ovarian syndrome)    Sleep apnea    Swelling of lower extremity    Patient Active Problem List   Diagnosis Date Noted   LLQ pain 04/21/2024   Vaginal bleeding 04/21/2024   IUD (intrauterine device) in place 04/21/2024   Deep endometriosis of bilateral uterosacral ligament(s) 04/13/2024   Dysmenorrhea 04/13/2024   Dyspareunia, female 04/13/2024   Family history of early CAD 02/27/2024   PID (pelvic inflammatory disease) 02/27/2024   Polyphagia 02/02/2024   Insulin  resistance 09/02/2023   Anxiety and depression 04/25/2023   Panic attacks 03/16/2023   Morbid obesity (HCC) 03/16/2023   Mild sleep apnea 03/16/2023   Anemia 03/16/2023   Obesity (BMI 30-39.9) 08/02/2022   Weight gain 08/02/2022   Vitamin D  deficiency 08/02/2022   Snores 08/02/2022   Daytime somnolence 08/02/2022   Hyperlipidemia 08/02/2022   Superior glenoid  labrum lesion of right shoulder 08/02/2022   PCOS (polycystic ovarian syndrome) 07/26/2022   Pelvic floor dysfunction 07/26/2022   Pituitary adenoma (HCC) 07/26/2022   At risk for sleep apnea 04/19/2022   Migraine with aura and without status migrainosus, not intractable 04/13/2021   Endometriosis determined by laparoscopy 03/17/2021   PMDD (premenstrual dysphoric disorder) 03/17/2021   Essential hypertension 04/12/2020   Tachycardia 11/03/2018   Dizziness 08/19/2018   Paresthesia 08/19/2018   Shellfish allergy 06/11/2016   Dysfunction of both eustachian tubes 10/25/2015   Acute bronchitis 06/24/2015   Allergic reaction to food 06/24/2015   Moderate persistent asthma without complication 06/24/2015   Other seasonal allergic rhinitis 06/24/2015   Urticaria 06/24/2015   Musculoskeletal neck pain 11/10/2014   Referred otalgia 11/10/2014   Temporomandibular joint dysfunction 11/10/2014   Home Medication(s) Prior to Admission medications  Medication Sig Start Date End Date Taking? Authorizing Provider  amoxicillin -clavulanate (AUGMENTIN ) 875-125 MG tablet Take 1 tablet by mouth every 12 (twelve) hours. 09/04/24  Yes Francesca Elsie CROME, MD  oxyCODONE  (ROXICODONE ) 5 MG immediate release tablet Take 1 tablet (5 mg total) by mouth every 6 (six) hours as needed for severe pain (pain score 7-10). 09/04/24  Yes Francesca Elsie CROME, MD  ABILIFY 5 MG tablet  03/13/23   [provider]  cetirizine (ZYRTEC) 10 MG tablet Take by mouth.    [provider]  EPINEPHrine  0.3 mg/0.3 mL  IJ SOAJ injection Inject 0.3 mg into the muscle as needed for anaphylaxis. 12/24/23   Henson, Vickie L, NP-C  fluconazole  (DIFLUCAN ) 150 MG tablet Take 1 tablet (150 mg total) by mouth daily. -For your yeast infection, start the Diflucan  (fluconazole )- Take one pill today (day 1). If you're still having symptoms in 3 days, take the second pill. 08/28/24   Graham, Laura E, PA-C  FLUoxetine (PROZAC) 40 MG  capsule Take 40 mg by mouth daily. 02/06/22   [provider]  ibuprofen  (ADVIL ) 800 MG tablet Take 1 tablet (800 mg total) by mouth 3 (three) times daily. 08/18/24   Leath-Warren, Etta PARAS, NP  ketorolac  (TORADOL ) 10 MG tablet Take 1 tablet (10 mg total) by mouth every 6 (six) hours as needed. 08/28/24   Graham, Laura E, PA-C  lamoTRIgine (LAMICTAL) 100 MG tablet Take by mouth daily. 03/08/24   [provider]  mometasone (NASONEX) 50 MCG/ACT nasal spray Place 2 sprays into the nose daily.    [provider]  norethindrone  (MICRONOR ) 0.35 MG tablet Take 1 tablet (0.35 mg total) by mouth daily. 05/04/24   Jayne Vonn DEL, MD  ofloxacin  (FLOXIN ) 0.3 % OTIC solution Place 5 drops into the right ear 2 (two) times daily for 7 days. 08/28/24 09/04/24  Graham, Laura E, PA-C  ondansetron  (ZOFRAN -ODT) 4 MG disintegrating tablet Take 1 tablet (4 mg total) by mouth every 12 (twelve) hours. 03/26/24   Henson, Vickie L, NP-C  ondansetron  (ZOFRAN -ODT) 8 MG disintegrating tablet Take 1 tablet (8 mg total) by mouth every 8 (eight) hours as needed for nausea or vomiting. 04/28/24   Jayne Vonn DEL, MD  PROAIR  HFA 108 (90 Base) MCG/ACT inhaler Inhale into the lungs. 07/26/21   [provider]  rizatriptan  (MAXALT ) 10 MG tablet Take 1 tablet (10 mg total) by mouth as needed for migraine. 07/13/24   Sater, Charlie LABOR, MD  rosuvastatin  (CRESTOR ) 10 MG tablet Take 1 tablet (10 mg total) by mouth daily. 02/27/24   Henson, Vickie L, NP-C  semaglutide -weight management (WEGOVY ) 1 MG/0.5ML SOAJ SQ injection Inject 1 mg into the skin once a week. 05/19/24   [place]keley Adelita PENNER, MD  verapamil  (VERELAN ) 100 MG 24 hr capsule Take 1 capsule (100 mg total) by mouth at bedtime. 07/13/24   Sater, Charlie LABOR, MD  Vitamin D , Ergocalciferol , (DRISDOL ) 1.25 MG (50000 UNIT) CAPS capsule Take 1 capsule (50,000 Units total) by mouth every 7 (seven) days. 03/25/24   Berkeley Adelita PENNER, MD                                                                                                                                     Past Surgical History Past Surgical History:  Procedure Laterality Date   EXPLORATORY LAPAROTOMY     endometriosis   EXTRACORPOREAL SHOCK WAVE LITHOTRIPSY Left 01/28/2022   Procedure: EXTRACORPOREAL SHOCK WAVE LITHOTRIPSY (ESWL);  Surgeon: Renda Glance, MD;  Location: [facility];  Service: Urology;  Laterality: Left;   LAPAROSCOPIC OVARIAN CYSTECTOMY     ovarian cyst   ROBOTIC ASSISTED TOTAL HYSTERECTOMY WITH BILATERAL SALPINGO OOPHERECTOMY N/A 04/28/2024   Procedure: HYSTERECTOMY, TOTAL, ROBOT-ASSISTED, LAPAROSCOPIC, WITH BILATERAL SALPINGECTOMY;  Surgeon: Jayne Vonn DEL, MD;  Location: [facility];  Service: Gynecology;  Laterality: N/A;   Family History Family History  Problem Relation Age of Onset   Obesity Paternal Grandfather    COPD Paternal Grandmother    Obesity Paternal Grandmother    Cancer Maternal Grandmother    Obesity Maternal Grandmother    Asthma Father    Anxiety disorder Father    Sleep apnea Father    Alcoholism Father    Drug abuse Father    Obesity Father    Alcohol abuse Father    High blood pressure Mother    High Cholesterol Mother    Stroke Mother    Thyroid disease Mother    Cancer Mother    Depression Mother    Anxiety disorder Mother    Obesity Mother    Hypertension Mother    Uterine cancer Mother     Social History Social History[1] Allergies Avocado, Betula alba oil, Kiwi extract, Shellfish allergy, Sulfa antibiotics, White birch, Latex, Sulfur, Avocado extract allergy skin test, Macrobid [nitrofurantoin], and Mango flavoring agent (non-screening)  Review of Systems Review of Systems  All other systems reviewed and are negative.   Physical Exam Vital Signs  I have reviewed the triage vital signs BP (!) 149/78   Pulse 89   Temp 98.2 F (36.8 C) (Oral)   Resp 18   Ht 5' 5 (1.651 m)   Wt 131.5 kg   LMP 03/25/2024    SpO2 98%   BMI 48.26 kg/m  Physical Exam Vitals and nursing note reviewed.  Constitutional:      Appearance: Normal appearance.  HENT:     Head: Normocephalic and atraumatic.     Mouth/Throat:     Mouth: Mucous membranes are moist.     Comments: Slightly poor dentition, tenderness over the right lower gum without focal swelling Eyes:     Conjunctiva/sclera: Conjunctivae normal.  Cardiovascular:     Rate and Rhythm: Normal rate.  Pulmonary:     Effort: Pulmonary effort is normal. No respiratory distress.  Abdominal:     General: Abdomen is flat.  Musculoskeletal:        General: No deformity.  Skin:    General: Skin is warm and dry.     Capillary Refill: Capillary refill takes less than 2 seconds.  Neurological:     General: No focal deficit present.     Mental Status: She is alert. Mental status is at baseline.  Psychiatric:        Mood and Affect: Mood normal.        Behavior: Behavior normal.     ED Results and Treatments Labs (all labs ordered are listed, but only abnormal results are displayed) Labs Reviewed - No data to display  Radiology No results found.  Pertinent labs & imaging results that were available during my care of the patient were reviewed by me and considered in my medical decision making (see MDM for details).  Medications Ordered in ED Medications  amoxicillin -clavulanate (AUGMENTIN ) 875-125 MG per tablet 1 tablet (has no administration in time range)  oxyCODONE  (Oxy IR/ROXICODONE ) immediate release tablet 5 mg (has no administration in time range)  acetaminophen  (TYLENOL ) tablet 1,000 mg (has no administration in time range)                                                                                                                                     Procedures Procedures  (including critical care time)  Medical  Decision Making / ED Course   MDM:  38 year old presenting with dental pain.  Examination with tenderness of her gum on the right.  No drainable abscess.  Seems consistent with mild prescription for medication for pain.  PDMP reviewed.  Recommended follow-up with dentist as scheduled.  Patient is stable for discharge. Will discharge patient to home. All questions answered. Patient comfortable with plan of discharge. Return precautions discussed with patient and specified on the after visit summary.       Medicines ordered and prescription drug management: Meds ordered this encounter  Medications   amoxicillin -clavulanate (AUGMENTIN ) 875-125 MG per tablet 1 tablet   oxyCODONE  (Oxy IR/ROXICODONE ) immediate release tablet 5 mg    Refill:  0   acetaminophen  (TYLENOL ) tablet 1,000 mg   oxyCODONE  (ROXICODONE ) 5 MG immediate release tablet    Sig: Take 1 tablet (5 mg total) by mouth every 6 (six) hours as needed for severe pain (pain score 7-10).    Dispense:  6 tablet    Refill:  0   amoxicillin -clavulanate (AUGMENTIN ) 875-125 MG tablet    Sig: Take 1 tablet by mouth every 12 (twelve) hours.    Dispense:  14 tablet    Refill:  0    -I have reviewed the patients home medicines and have made adjustments as needed  Social Determinants of Health:  Diagnosis or treatment significantly limited by social determinants of health: obesity   Reevaluation: After the interventions noted above, I reevaluated the patient and found that their symptoms have improved  Co morbidities that complicate the patient evaluation  Past Medical History:  Diagnosis Date   Allergy    Age 41   Anxiety    Arthritis    Age 69 via MRI knee   Asthma    B12 deficiency    Constipation    Depression    Endometriosis    GERD (gastroesophageal reflux disease)    Headache    Infertility, female    PCOS (polycystic ovarian syndrome)    Sleep apnea    Swelling of lower extremity        Dispostion: Disposition decision including need for hospitalization was considered, and patient discharged from emergency  department.    Final Clinical Impression(s) / ED Diagnoses Final diagnoses:  Pain, dental     This chart was dictated using voice recognition software.  Despite best efforts to proofread,  errors can occur which can change the documentation meaning.     [1]  Social History Tobacco Use   Smoking status: Former    Types: Cigarettes   Smokeless tobacco: Never  Vaping Use   Vaping status: Every Day  Substance Use Topics   Alcohol use: Yes    Comment: occ   Drug use: Never     Francesca Elsie CROME, MD 09/04/24 2016  "

## 2024-09-04 NOTE — ED Notes (Signed)
  Pt teaching provided on medications that may cause drowsiness. Pt instructed not to drive or operate heavy machinery while taking the prescribed medication. Pt verbalized understanding.   Pt provided discharge instructions and prescription information. Pt was given the opportunity to ask questions and questions were answered.

## 2024-09-04 NOTE — Discharge Instructions (Addendum)
 Please take Tylenol  (acetaminophen ) and Motrin  (ibuprofen ) for your symptoms at home.  You can take 1000 mg of Tylenol  every 6 hours and 600 mg of Motrin  every 6 hours as needed for your symptoms.  You can take these medicines together as needed, either at the same time, or alternating every 3 hours.  We have given you a small amount of oxycodone  which you can take for pain not relieved by Tylenol  and Motrin .  Do not drink alcohol, drive, or operate machinery when taking this medicine.  Please follow up with your dentist. Return to ER if your symptoms worsen.

## 2024-09-07 DIAGNOSIS — F330 Major depressive disorder, recurrent, mild: Secondary | ICD-10-CM | POA: Diagnosis not present

## 2024-09-13 ENCOUNTER — Other Ambulatory Visit: Payer: Self-pay | Admitting: Family Medicine

## 2024-09-13 DIAGNOSIS — Z8249 Family history of ischemic heart disease and other diseases of the circulatory system: Secondary | ICD-10-CM

## 2024-09-13 DIAGNOSIS — E7800 Pure hypercholesterolemia, unspecified: Secondary | ICD-10-CM

## 2024-09-19 ENCOUNTER — Encounter: Payer: Self-pay | Admitting: Neurology

## 2024-09-20 NOTE — Telephone Encounter (Signed)
 Pt called to request to get change of medicatio due to Insurance stated Pt doesn't need to  take this medication and will not be covered  verapamil  (VERELAN ) 100 MG 24 hr capsule

## 2024-09-21 ENCOUNTER — Telehealth: Payer: Self-pay

## 2024-09-21 ENCOUNTER — Other Ambulatory Visit (HOSPITAL_COMMUNITY): Payer: Self-pay

## 2024-09-21 NOTE — Telephone Encounter (Signed)
 Pharmacy Patient Advocate Encounter   Received notification from Patient Advice Request messages that prior authorization for Verapamil  is required/requested.   Insurance verification completed.   The patient is insured through [facility].   Per test claim: PA required; PA submitted to above mentioned insurance via Latent Key/confirmation #/EOC [id number] Status is pending

## 2024-09-28 NOTE — Telephone Encounter (Signed)
 Pharmacy Patient Advocate Encounter  Received notification from [employer] that Prior Authorization for Verapamil  HCl ER 100MG  er capsules has been DENIED.  Full denial letter will be uploaded to the media tab. See denial reason below.  The requested medication is not FDA-approved for the diagnosis provided  PA #/Case ID/Reference #: [id number]

## 2024-09-29 ENCOUNTER — Other Ambulatory Visit (HOSPITAL_COMMUNITY): Payer: Self-pay

## 2024-09-29 ENCOUNTER — Other Ambulatory Visit: Payer: Self-pay | Admitting: Neurology

## 2024-09-29 MED ORDER — VERAPAMIL HCL ER 120 MG PO TBCR: 120.0000 mg | EXTENDED_RELEASE_TABLET | Freq: Every day | ORAL | 11 refills | Status: AC

## 2024-09-29 NOTE — Telephone Encounter (Signed)
 Yes sir, they will cover it. However, they will only pay for a maximum of 30 days at a time. Co-pay is $10.00

## 2024-10-01 ENCOUNTER — Ambulatory Visit: Admitting: Family Medicine

## 2024-10-08 ENCOUNTER — Other Ambulatory Visit: Payer: Self-pay | Admitting: Obstetrics & Gynecology

## 2025-07-14 ENCOUNTER — Ambulatory Visit: Admitting: Neurology
